# Patient Record
Sex: Female | Born: 1945
Health system: Southern US, Community
[De-identification: ages and names within clinical notes are randomized; demographics above are authoritative.]

## PROBLEM LIST (undated history)

## (undated) DIAGNOSIS — S52023A Displaced fracture of olecranon process without intraarticular extension of unspecified ulna, initial encounter for closed fracture: Principal | ICD-10-CM

## (undated) DIAGNOSIS — I499 Cardiac arrhythmia, unspecified: Secondary | ICD-10-CM

## (undated) DIAGNOSIS — R008 Other abnormalities of heart beat: Secondary | ICD-10-CM

## (undated) DIAGNOSIS — T4145XA Adverse effect of unspecified anesthetic, initial encounter: Secondary | ICD-10-CM

## (undated) DIAGNOSIS — S52031K Displaced fracture of olecranon process with intraarticular extension of right ulna, subsequent encounter for closed fracture with nonunion: Secondary | ICD-10-CM

## (undated) DIAGNOSIS — D649 Anemia, unspecified: Secondary | ICD-10-CM

## (undated) DIAGNOSIS — I10 Essential (primary) hypertension: Secondary | ICD-10-CM

## (undated) DIAGNOSIS — T8859XA Other complications of anesthesia, initial encounter: Secondary | ICD-10-CM

## (undated) DIAGNOSIS — E785 Hyperlipidemia, unspecified: Secondary | ICD-10-CM

## (undated) DIAGNOSIS — Z973 Presence of spectacles and contact lenses: Secondary | ICD-10-CM

## (undated) DIAGNOSIS — Z8619 Personal history of other infectious and parasitic diseases: Secondary | ICD-10-CM

## (undated) DIAGNOSIS — R112 Nausea with vomiting, unspecified: Secondary | ICD-10-CM

## (undated) DIAGNOSIS — E039 Hypothyroidism, unspecified: Secondary | ICD-10-CM

## (undated) DIAGNOSIS — S52021A Displaced fracture of olecranon process without intraarticular extension of right ulna, initial encounter for closed fracture: Secondary | ICD-10-CM

## (undated) DIAGNOSIS — K5790 Diverticulosis of intestine, part unspecified, without perforation or abscess without bleeding: Secondary | ICD-10-CM

## (undated) DIAGNOSIS — K635 Polyp of colon: Secondary | ICD-10-CM

## (undated) DIAGNOSIS — M1990 Unspecified osteoarthritis, unspecified site: Secondary | ICD-10-CM

## (undated) DIAGNOSIS — J45909 Unspecified asthma, uncomplicated: Secondary | ICD-10-CM

## (undated) DIAGNOSIS — Z972 Presence of dental prosthetic device (complete) (partial): Secondary | ICD-10-CM

## (undated) DIAGNOSIS — Z9889 Other specified postprocedural states: Secondary | ICD-10-CM

## (undated) DIAGNOSIS — K219 Gastro-esophageal reflux disease without esophagitis: Secondary | ICD-10-CM

## (undated) DIAGNOSIS — R7301 Impaired fasting glucose: Secondary | ICD-10-CM

## (undated) HISTORY — DX: Other abnormalities of heart beat: R00.8

## (undated) HISTORY — PX: CHOLECYSTECTOMY: SHX55

## (undated) HISTORY — PX: TONSILLECTOMY: SUR1361

## (undated) HISTORY — PX: OTHER SURGICAL HISTORY: SHX169

## (undated) HISTORY — DX: Anemia, unspecified: D64.9

## (undated) HISTORY — PX: TUBAL LIGATION: SHX77

## (undated) HISTORY — PX: HEMORROIDECTOMY: SUR656

## (undated) HISTORY — PX: NASAL SINUS SURGERY: SHX719

## (undated) HISTORY — DX: Hyperlipidemia, unspecified: E78.5

## (undated) HISTORY — PX: KNEE ARTHROSCOPY: SUR90

## (undated) HISTORY — DX: Diverticulosis of intestine, part unspecified, without perforation or abscess without bleeding: K57.90

## (undated) HISTORY — PX: BELPHAROPTOSIS REPAIR: SHX369

## (undated) HISTORY — DX: Gastro-esophageal reflux disease without esophagitis: K21.9

## (undated) HISTORY — PX: COLONOSCOPY: SHX174

## (undated) HISTORY — DX: Displaced fracture of olecranon process without intraarticular extension of unspecified ulna, initial encounter for closed fracture: S52.023A

## (undated) HISTORY — DX: Hypothyroidism, unspecified: E03.9

## (undated) HISTORY — PX: UPPER GI ENDOSCOPY: SHX6162

## (undated) HISTORY — DX: Impaired fasting glucose: R73.01

## (undated) HISTORY — DX: Polyp of colon: K63.5

## (undated) HISTORY — DX: Unspecified asthma, uncomplicated: J45.909

---

## 2011-06-29 ENCOUNTER — Encounter: Payer: Self-pay | Admitting: Internal Medicine

## 2011-06-29 ENCOUNTER — Ambulatory Visit (INDEPENDENT_AMBULATORY_CARE_PROVIDER_SITE_OTHER): Payer: BC Managed Care – PPO | Admitting: Internal Medicine

## 2011-06-29 DIAGNOSIS — Z8739 Personal history of other diseases of the musculoskeletal system and connective tissue: Secondary | ICD-10-CM

## 2011-06-29 DIAGNOSIS — E669 Obesity, unspecified: Secondary | ICD-10-CM

## 2011-06-29 DIAGNOSIS — R3915 Urgency of urination: Secondary | ICD-10-CM | POA: Insufficient documentation

## 2011-06-29 DIAGNOSIS — R7301 Impaired fasting glucose: Secondary | ICD-10-CM | POA: Insufficient documentation

## 2011-06-29 DIAGNOSIS — K219 Gastro-esophageal reflux disease without esophagitis: Secondary | ICD-10-CM

## 2011-06-29 DIAGNOSIS — H409 Unspecified glaucoma: Secondary | ICD-10-CM

## 2011-06-29 DIAGNOSIS — N6029 Fibroadenosis of unspecified breast: Secondary | ICD-10-CM

## 2011-06-29 DIAGNOSIS — E785 Hyperlipidemia, unspecified: Secondary | ICD-10-CM | POA: Insufficient documentation

## 2011-06-29 DIAGNOSIS — Z79890 Hormone replacement therapy (postmenopausal): Secondary | ICD-10-CM

## 2011-06-29 DIAGNOSIS — J45909 Unspecified asthma, uncomplicated: Secondary | ICD-10-CM | POA: Insufficient documentation

## 2011-06-29 DIAGNOSIS — E039 Hypothyroidism, unspecified: Secondary | ICD-10-CM

## 2011-06-29 NOTE — Progress Notes (Signed)
Subjective:    Patient ID: Marie Kelly, female    DOB: 04/22/1947, 66 y.o.   MRN: [id number]  HPI  Patient comes in as new patient visit . Previous care was  In [place]. Family and gyne  Physician. Has a few records .  Here since   June 2012 now establishing  With PCP. Ongoing conditions:  Asthma  Does not take inhaler every day  .  Advair. Has been on this however for a long time.  For 15 years.   Couple times a week.  Hx of pulm eval neg asthma but then had flare.  2008.   Hx of lipid medicine.  lipitor   For elevated lipids   But changed to Pravachol for cost reasons.    hrt  For heart disease foferever  And forother.  Hx of 3 dand cs . And then low dose. Review of Systems Diverticulosis no flare  Just dx glaucoma  Off an on 2009 .   Hx of hemorhage night vision HB reflux taking prn nexium.  H pylori  Egd.  About 10 year ago. Thyroid : dx  Years ago and underactive . On generic .    ROS:  GEN/ HEENT: No fever, gained weight after move  sweats headaches vision problems hearing changes, CV/ PULM; No chest pain shortness of breath cough, syncope,edema  change in exercise tolerance. GI /GU: No adominal pain, vomiting, change in bowel habits. No blood in the stool. No significant GU symptoms. SKIN/HEME: ,no acute skin rashes suspicious lesions or bleeding. No lymphadenopathy, nodules, masses.  NEURO/ PSYCH:  No neurologic signs such as weakness numbness. No depression anxiety. IMM/ Allergy: No unusual infections.  Allergy .  Asthma as per hpi  REST of 12 system review negative except as per HPI  Past Medical History  Diagnosis Date  . Colon polyps   . GERD (gastroesophageal reflux disease)     had endo? taking nexium 3 x per week  . Diverticulosis   . Hyperlipidemia     had been on lipitor cahnges to pravachol cause of cost and tricor has since stopped  NO  se reported   . Anemia   . Impaired fasting glucose   . Hypothyroidism      on meds for years    History   Social  History  . Marital Status: Unknown    Spouse Name: N/A    Number of Children: N/A  . Years of Education: N/A   Occupational History  . Not on file.   Social History Main Topics  . Smoking status: Former Games developer  . Smokeless tobacco: Not on file  . Alcohol Use: Yes     socially  . Drug Use: No  . Sexually Active: Not on file   Other Topics Concern  . 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 Gravida 3 para 3Last td 6 2008REmote hx of tobacco 1.5 ppd for 6 years as a teen Q 65    Past Surgical History  Procedure Date  . Tubal ligation   . Cholecystectomy   . Uterus repair   . Hemorroidectomy     Family History  Problem Relation Age of Onset  . Stroke Mother   . Diabetes Father     Allergies  Allergen Reactions  .  Hydrochlorothiazide     Muscles tightness and felt like she was hit by a truck    No current outpatient prescriptions on file prior to visit.    BP 120/80  Pulse 60  Ht 5\' 4"  (1.626 m)  Wt 200 lb (90.719 kg)  BMI 34.33 kg/m2        Objective:   Physical Exam Physical Exam: Vital signs reviewed FAO:ZHYQ is a well-developed well-nourished alert cooperative  white female who appears her stated age in no acute distress.  HEENT: normocephalic atraumatic , Eyes: PERRL EOM's full, conjunctiva clear, Nares: paten,t no deformity discharge or tenderness., Ears: no deformity EAC's clear TMs with normal landmarks. Mouth: clear OP, no lesions, edema.  Moist mucous membranes. Dentition in adequate repair. NECK: supple without masses, thyromegaly or bruits. CHEST/PULM:  Clear to auscultation and percussion breath sounds equal no wheeze , rales or rhonchi. No chest wall deformities or tenderness. CV: PMI is nondisplaced, S1 S2 no gallops, murmurs, rubs. Peripheral pulses are full without delay.No JVD .    ABDOMEN: Bowel sounds normal nontender  No guard or rebound, no hepato splenomegal no CVA tenderness.  No hernia. Well healed scar ruq  Extremtities:  No clubbing cyanosis or edema, no acute joint swelling or redness no focal atrophy NEURO:  Oriented x3, cranial nerves 3-12 appear to be intact, no obvious focal weakness,gait within normal limits  SKIN: No acute rashes normal turgor, color, no bruising or petechiae. PSYCH: Oriented, good eye contact, no obvious depression anxiety, cognition and judgment appear normal. LN: no cervical  adenopathy    Records reviewed with and after patient left areas a cholesterol of 202 HDL 54 triglycerides 85 LDL 131 with a ratio of 3.7 hemoglobin A1c 5.6 this was 03/05/2011 unclear what medication she was on at that time.       Assessment & Plan:   Hyperlipidemia Previously on medication stopped because she heard  it wasn't helpful in women. She apparently was on pravastatin and TriCor. She's never had heart disease or diabetes although has had some hyperglycemia. She apparently had no side effects of medication and did get liver checked every 6 months.  Counseled about risk benefit based on risk which would include age. She has no other risk factors that I can tell at this time. She does ask about getting particle size for her cholesterol panel however discussed with her the practice guidelines would not indicate this would be helpful for her outcome. Discussed the [place] trial and other situations and the use of coronary artery calcium score is.  At this point in time we will implement lifestyle intervention healthy lifestyle and check her lipids in one to 2 months  and then plan other interventions.  Asthma  apparently controlled on daily Advair however most recently is only taking it a few times a week. At some point she may want to down shift to straight inhaled cortisone as opposed to the combination inhaler.  Hormone replacement therapy: According to  patient she was placed on the sac menopause although she had no symptoms and was told that it was healthy for her heart and to avoid vaginal dryness and other problems and she's been on it since that time. She does have lumpy breasts and may benefit  from weaning off of this situation. We discussed risk benefit of hormone replacement at this time it does not appear she has more benefit than risk. Discussed weaning the medication and see her she doesn't follow visit.  Hypothyroid  on replacement for a number of years we'll continue at this time and recheck Early glaucoma on drops    History of hyperglycemia family history of diabetes  discussed importance of lifestyle intervention weight loss and monitoring.  Obesity counseled as above  GE reflux disease  apparently no alarm complications and she is taking Nexium about 3 times a  week discussed metabolic affects of chronic use and weight loss and lifestyle would be best to minimize need for medication.  Urinary urgency has been on Detrol for number of years can continue no apparent side effects  Review of records from the gynecologist; apparently was on Fosamax for 4 or 5 years and then Actonel for another 3-4 years. DEXA in 2000 showed a normal scan except for mild osteopenia and L3. Her last DEXA scan was 2011 which showed 0.6 T score in the spine and -0.9 in the hip. At this time it appears she was off bisphosphonates for couple years.   There is no history of fracture  but the scan report says there was a family history of osteoporosis.     Total visit 45 mins > 50% spent counseling and coordinating care  Review of data

## 2011-06-29 NOTE — Patient Instructions (Addendum)
Wean the  Hormone   Go to 4 x per week and then every other day.     And finally off.   If getting hot flushes then stop weaning.   Agree with using nexium as needed and do life style changes to help heartbun.   Plan fasting labs and then follow up.   Weight loss .  Will help all of the parameters of blood sugar reflux and lipids.

## 2011-06-30 DIAGNOSIS — Z8739 Personal history of other diseases of the musculoskeletal system and connective tissue: Secondary | ICD-10-CM | POA: Insufficient documentation

## 2011-06-30 NOTE — Assessment & Plan Note (Signed)
Review of records from the gynecologist; apparently was on Fosamax for 4 or 5 years and then Actonel for another 3-4 years. DEXA in 2000 showed a normal scan except for mild osteopenia and L3. Her last DEXA scan was 2011 which showed 0.6 T score in the spine and -0.9 in the hip. At this time it appears she was off bisphosphonates for couple years.   There is no history of fracture  but the scan report says there was a family history of osteoporosis.

## 2011-07-02 ENCOUNTER — Other Ambulatory Visit: Payer: Self-pay | Admitting: Internal Medicine

## 2011-07-02 DIAGNOSIS — Z1231 Encounter for screening mammogram for malignant neoplasm of breast: Secondary | ICD-10-CM

## 2011-07-04 ENCOUNTER — Encounter: Payer: Self-pay | Admitting: Internal Medicine

## 2011-07-04 DIAGNOSIS — H409 Unspecified glaucoma: Secondary | ICD-10-CM | POA: Insufficient documentation

## 2011-07-04 DIAGNOSIS — E669 Obesity, unspecified: Secondary | ICD-10-CM | POA: Insufficient documentation

## 2011-07-29 ENCOUNTER — Ambulatory Visit
Admission: RE | Admit: 2011-07-29 | Discharge: 2011-07-29 | Disposition: A | Discharge status: Home / Self Care | Payer: BC Managed Care – PPO | Source: Ambulatory Visit | Attending: Internal Medicine | Admitting: Internal Medicine

## 2011-07-29 DIAGNOSIS — Z1231 Encounter for screening mammogram for malignant neoplasm of breast: Secondary | ICD-10-CM

## 2011-08-25 ENCOUNTER — Ambulatory Visit (INDEPENDENT_AMBULATORY_CARE_PROVIDER_SITE_OTHER): Payer: BC Managed Care – PPO | Admitting: Internal Medicine

## 2011-08-25 ENCOUNTER — Encounter: Payer: Self-pay | Admitting: Internal Medicine

## 2011-08-25 ENCOUNTER — Other Ambulatory Visit: Payer: Self-pay | Admitting: Internal Medicine

## 2011-08-25 VITALS — BP 132/78 | HR 71 | Temp 98.7°F | Wt 199.0 lb

## 2011-08-25 DIAGNOSIS — L02419 Cutaneous abscess of limb, unspecified: Secondary | ICD-10-CM

## 2011-08-25 DIAGNOSIS — IMO0002 Reserved for concepts with insufficient information to code with codable children: Secondary | ICD-10-CM | POA: Insufficient documentation

## 2011-08-25 MED ORDER — DOXYCYCLINE HYCLATE 100 MG PO CAPS: 100.0000 mg | ORAL_CAPSULE | Freq: Two times a day (BID) | ORAL | Status: DC

## 2011-08-25 NOTE — Progress Notes (Signed)
  Subjective:    Patient ID: Marie Kelly, female    DOB: 07/12/1945, 66 y.o.   MRN: [id number]  HPI Comes in today for an acute visit. She had the onset 6 days ago of a red boil on her right upper thigh that is increased in size. Today it is slightly less red no drainage no fever no treatment except for warm compresses. She has no history of recurrent boils no exposure to staph infection except for a friend of her sons. No direct contact.   Review of Systems No fever constitutonal sx NVD new rash  Of adenopathy    Past history family history social history reviewed in the electronic medical record. Outpatient Prescriptions Prior to Visit  Medication Sig Dispense Refill  . fluticasone (FLONASE) 50 MCG/ACT nasal spray Place 2 sprays into the nose daily.      . Fluticasone-Salmeterol (ADVAIR) 250-50 MCG/DOSE AEPB Inhale 1 puff into the lungs every 12 (twelve) hours.      Marland Kitchen latanoprost (XALATAN) 0.005 % ophthalmic solution 1 drop at bedtime.      Marland Kitchen levothyroxine (SYNTHROID, LEVOTHROID) 50 MCG tablet Take 50 mcg by mouth daily.      . montelukast (SINGULAIR) 10 MG tablet Take 10 mg by mouth at bedtime.      . norethindrone-ethinyl estradiol (FEMHRT LOW DOSE) 0.5-2.5 MG-MCG per tablet Take 1 tablet by mouth daily.      Marland Kitchen tolterodine (DETROL LA) 2 MG 24 hr capsule Take 2 mg by mouth daily.           Objective:   Physical Exam BP 132/78  Pulse 71  Temp(Src) 98.7 F (37.1 C) (Oral)  Wt 199 lb (90.266 kg)  SpO2 98% WDWN in nad Abdomen:  Sof,t normal bowel sounds without hepatosplenomegaly, no guarding rebound or masses no CVA tenderness Skin right upper thigh shows a 3 cm cystic abscess-like raised area with 5-6 cm of erythema around it. There is no vesicle no adenopathy.       Assessment & Plan:  Abscess boil possibly infected cyst  I&D explained   : pt agrees   1% lidocaine 2 cc opened up with 20-gauge needle and a  #11 blade explored with pickups.  Moderate amount of yellow  discharge noted and expressed. Patient tolerated this well discharge sent for culture. Dry dressing   Plan warm compresses antibiotic doxycycline expectant management followup; normal skin hygiene to prevent transmission.

## 2011-08-25 NOTE — Patient Instructions (Signed)
  Warm compresses  To continue antibiotic .  Call if not improving or recurring .  Abscess An abscess (boil or furuncle) is an infected area that contains a collection of pus.  SYMPTOMS Signs and symptoms of an abscess include pain, tenderness, redness, or hardness. You may feel a moveable soft area under your skin. An abscess can occur anywhere in the body.  TREATMENT  A surgical cut (incision) may be made over your abscess to drain the pus. Gauze may be packed into the space or a drain may be looped through the abscess cavity (pocket). This provides a drain that will allow the cavity to heal from the inside outwards. The abscess may be painful for a few days, but should feel much better if it was drained.  Your abscess, if seen early, may not have localized and may not have been drained. If not, another appointment may be required if it does not get better on its own or with medications. HOME CARE INSTRUCTIONS   Only take over-the-counter or prescription medicines for pain, discomfort, or fever as directed by your caregiver.   Take your antibiotics as directed if they were prescribed. Finish them even if you start to feel better.   Keep the skin and clothes clean around your abscess.   If the abscess was drained, you will need to use gauze dressing to collect any draining pus. Dressings will typically need to be changed 3 or more times a day.   The infection may spread by skin contact with others. Avoid skin contact as much as possible.   Practice good hygiene. This includes regular hand washing, cover any draining skin lesions, and do not share personal care items.   If you participate in sports, do not share athletic equipment, towels, whirlpools, or personal care items. Shower after every practice or tournament.   If a draining area cannot be adequately covered:   Do not participate in sports.   Children should not participate in day care until the wound has healed or drainage  stops.   If your caregiver has given you a follow-up appointment, it is very important to keep that appointment. Not keeping the appointment could result in a much worse infection, chronic or permanent injury, pain, and disability. If there is any problem keeping the appointment, you must call back to this facility for assistance.  SEEK MEDICAL CARE IF:   You develop increased pain, swelling, redness, drainage, or bleeding in the wound site.   You develop signs of generalized infection including muscle aches, chills, fever, or a general ill feeling.   You have an oral temperature above 102 F (38.9 C).  MAKE SURE YOU:   Understand these instructions.   Will watch your condition.   Will get help right away if you are not doing well or get worse.  Document Released: 02/10/2005 Document Revised: 04/22/2011 Document Reviewed: 12/05/2007 [facility] Patient Information 2012 [place], [place].

## 2011-08-27 ENCOUNTER — Other Ambulatory Visit (INDEPENDENT_AMBULATORY_CARE_PROVIDER_SITE_OTHER): Payer: BC Managed Care – PPO

## 2011-08-27 DIAGNOSIS — R7301 Impaired fasting glucose: Secondary | ICD-10-CM

## 2011-08-27 DIAGNOSIS — E785 Hyperlipidemia, unspecified: Secondary | ICD-10-CM

## 2011-08-27 DIAGNOSIS — E039 Hypothyroidism, unspecified: Secondary | ICD-10-CM

## 2011-08-27 LAB — HEPATIC FUNCTION PANEL
AST: 23 U/L (ref 0–37)
Albumin: 4.5 g/dL (ref 3.5–5.2)
Alkaline Phosphatase: 36 U/L — ABNORMAL LOW (ref 39–117)
Bilirubin, Direct: 0 mg/dL (ref 0.0–0.3)
Total Protein: 7.4 g/dL (ref 6.0–8.3)

## 2011-08-27 LAB — CBC WITH DIFFERENTIAL/PLATELET
Basophils Absolute: 0 10*3/uL (ref 0.0–0.1)
Basophils Relative: 0.4 % (ref 0.0–3.0)
HCT: 39.5 % (ref 36.0–46.0)
Hemoglobin: 13.3 g/dL (ref 12.0–15.0)
Lymphocytes Relative: 24.8 % (ref 12.0–46.0)
Lymphs Abs: 1.2 10*3/uL (ref 0.7–4.0)
MCHC: 33.6 g/dL (ref 30.0–36.0)
Monocytes Relative: 9.1 % (ref 3.0–12.0)
Neutro Abs: 2.9 10*3/uL (ref 1.4–7.7)
RBC: 4.65 Mil/uL (ref 3.87–5.11)
RDW: 13.7 % (ref 11.5–14.6)

## 2011-08-27 LAB — LIPID PANEL
Cholesterol: 175 mg/dL (ref 0–200)
Triglycerides: 67 mg/dL (ref 0.0–149.0)

## 2011-08-27 LAB — BASIC METABOLIC PANEL
BUN: 13 mg/dL (ref 6–23)
CO2: 24 mEq/L (ref 19–32)
Calcium: 9.3 mg/dL (ref 8.4–10.5)
Chloride: 107 mEq/L (ref 96–112)
Creatinine, Ser: 0.7 mg/dL (ref 0.4–1.2)

## 2011-08-28 LAB — WOUND CULTURE: Gram Stain: NONE SEEN

## 2011-09-03 ENCOUNTER — Encounter: Payer: Self-pay | Admitting: Internal Medicine

## 2011-09-03 ENCOUNTER — Ambulatory Visit (INDEPENDENT_AMBULATORY_CARE_PROVIDER_SITE_OTHER): Payer: BC Managed Care – PPO | Admitting: Internal Medicine

## 2011-09-03 VITALS — BP 168/98 | HR 76 | Temp 98.8°F | Wt 196.0 lb

## 2011-09-03 DIAGNOSIS — Z79890 Hormone replacement therapy (postmenopausal): Secondary | ICD-10-CM

## 2011-09-03 DIAGNOSIS — L03119 Cellulitis of unspecified part of limb: Secondary | ICD-10-CM

## 2011-09-03 DIAGNOSIS — E039 Hypothyroidism, unspecified: Secondary | ICD-10-CM

## 2011-09-03 DIAGNOSIS — L02419 Cutaneous abscess of limb, unspecified: Secondary | ICD-10-CM

## 2011-09-03 DIAGNOSIS — IMO0002 Reserved for concepts with insufficient information to code with codable children: Secondary | ICD-10-CM

## 2011-09-03 DIAGNOSIS — E785 Hyperlipidemia, unspecified: Secondary | ICD-10-CM

## 2011-09-03 DIAGNOSIS — R7301 Impaired fasting glucose: Secondary | ICD-10-CM

## 2011-09-03 DIAGNOSIS — R030 Elevated blood-pressure reading, without diagnosis of hypertension: Secondary | ICD-10-CM

## 2011-09-03 MED ORDER — DOXYCYCLINE HYCLATE 100 MG PO CAPS: 100.0000 mg | ORAL_CAPSULE | Freq: Two times a day (BID) | ORAL | Status: DC

## 2011-09-03 NOTE — Progress Notes (Signed)
  Subjective:    Patient ID: Marie Kelly, female    DOB: 1945-09-30, 66 y.o.   MRN: [id number]  HPI Patient comes in for followup of multiple medical problems. Since last visit abscess cyst is better still hurts  darined for a while and not none. Antibiotic causes some gi issues. No fever new areas. Here for fu labs states  See past note Review of Systems No fever chills  syndop NVD .  Some heart burn with  Doxy no rash . bp seems up per her cause of a stress issue  GD with a tick bite? Never had HT per se.  Past history family history social history reviewed in the electronic medical record.     Objective:   Physical Exam  BP 168/98  Pulse 76  Temp(Src) 98.8 F (37.1 C) (Oral)  Wt 196 lb (88.905 kg)  SpO2 96% Repeat bp 160/90 right sitting  wddwn in nad mildy anxious. Chest: nl respirations  CV:  S1-S2 no gallops or murmurs peripheral perfusion is normal   Lab Results  Component Value Date   WBC 4.8 08/27/2011   HGB 13.3 08/27/2011   HCT 39.5 08/27/2011   PLT 259.0 08/27/2011   GLUCOSE 81 08/27/2011   CHOL 175 08/27/2011   TRIG 67.0 08/27/2011   HDL 49.10 08/27/2011   LDLCALC 113* 08/27/2011   ALT 32 08/27/2011   AST 23 08/27/2011   NA 140 08/27/2011   K 4.9 08/27/2011   CL 107 08/27/2011   CREATININE 0.7 08/27/2011   BUN 13 08/27/2011   CO2 24 08/27/2011   TSH 3.32 08/27/2011   HGBA1C 5.8 08/27/2011   Lab reviewed with patient  See gm st and cx      Assessment & Plan:    Abscess looks improved but she states it's still tender. Continue hot compresses and a few more days of antibiotics   Elevated blood pressure readings this is a new she has some stressful information this morning usually is normal. She will check a followup consistently elevated. Reviewed bp monitoring and to fu if still elevated   Hyperglycemia   Hyperlipidemia :appears to be acceptable levels off of medication.  Hypothyroid :continue same medication she is on generic at this time.  HRT  weaning  If everything is improved wellness check in 6 months.  Total visit > 50% spent counseling and coordinating care

## 2011-09-03 NOTE — Patient Instructions (Signed)
Your laboratory studies are good stay on the same medications at this point.  Continue warm compresses to the abscess area can take 4-5 more days of antibiotic if needed. Expect continued improvement into next week. He can take the antibiotic with food.  Checked her blood pressure readings at least 3 times this week if not controlled plan followup visit to discuss.  Blood pressure goal is below 140/90.  Otherwise preventive visit in 6 months.

## 2011-09-04 ENCOUNTER — Encounter: Payer: Self-pay | Admitting: Internal Medicine

## 2011-09-04 DIAGNOSIS — R030 Elevated blood-pressure reading, without diagnosis of hypertension: Secondary | ICD-10-CM | POA: Insufficient documentation

## 2011-09-04 NOTE — Assessment & Plan Note (Signed)
Better still on antibiotic  Still tender    Will can add 5 more days of antibiotic    Expectant management. Fu if  persistent or progressive  cx did not grow  Pathogen but had gm pos on gm stain

## 2011-09-04 NOTE — Assessment & Plan Note (Signed)
No high risk condition except age and levels acceptable off meds  Will follow at this time off meds.  Continue lifestyle intervention healthy eating and exercise .

## 2011-09-04 NOTE — Assessment & Plan Note (Signed)
Acceptable levels no change

## 2011-09-04 NOTE — Assessment & Plan Note (Signed)
Lab Results  Component Value Date   HGBA1C 5.8 08/27/2011  Continue lifestyle intervention healthy eating and exercise .

## 2011-09-09 ENCOUNTER — Ambulatory Visit (INDEPENDENT_AMBULATORY_CARE_PROVIDER_SITE_OTHER): Payer: BC Managed Care – PPO | Admitting: Internal Medicine

## 2011-09-09 ENCOUNTER — Ambulatory Visit (INDEPENDENT_AMBULATORY_CARE_PROVIDER_SITE_OTHER): Payer: BC Managed Care – PPO | Admitting: General Surgery

## 2011-09-09 ENCOUNTER — Encounter: Payer: Self-pay | Admitting: Internal Medicine

## 2011-09-09 ENCOUNTER — Encounter (INDEPENDENT_AMBULATORY_CARE_PROVIDER_SITE_OTHER): Payer: Self-pay | Admitting: General Surgery

## 2011-09-09 VITALS — BP 127/84 | HR 74 | Temp 98.7°F | Resp 16 | Ht 64.5 in | Wt 198.2 lb

## 2011-09-09 VITALS — BP 146/90 | HR 60 | Temp 98.6°F | Wt 199.0 lb

## 2011-09-09 DIAGNOSIS — L03119 Cellulitis of unspecified part of limb: Secondary | ICD-10-CM

## 2011-09-09 DIAGNOSIS — Z23 Encounter for immunization: Secondary | ICD-10-CM

## 2011-09-09 DIAGNOSIS — IMO0002 Reserved for concepts with insufficient information to code with codable children: Secondary | ICD-10-CM

## 2011-09-09 DIAGNOSIS — L02419 Cutaneous abscess of limb, unspecified: Secondary | ICD-10-CM | POA: Insufficient documentation

## 2011-09-09 MED ORDER — AMOXICILLIN-POT CLAVULANATE 875-125 MG PO TABS: 1.0000 | ORAL_TABLET | Freq: Two times a day (BID) | ORAL | Status: AC

## 2011-09-09 NOTE — Progress Notes (Signed)
  Subjective:    Patient ID: Marie Kelly, female    DOB: 17-Dec-1945, 66 y.o.   MRN: [id number]  HPI Patient comes in today for SDA for worsening problem evaluation. Periodic her thigh is still not better and is still tender to touch. It is no longer draining and she is still on the antibiotic. It isn't worse and she has no fever.  Also asks about updating the Tdap Because she's going to have a new grandchild in June her last one was about 5 years ago  Review of Systems No fever chills new skin lesions    Objective:   Physical Exam BP 146/90  Pulse 60  Temp(Src) 98.6 F (37 C) (Oral)  Wt 199 lb (90.266 kg)  SpO2 98% Well-developed well-nourished in no acute distress examination of right upper thigh 2 cm cystic feeling firm with found her on expression some cystic material and pus expressed Some redness surrounding it but no streaking or cellulitis. Culture taken again     Assessment & Plan:   Failure to resolve right leg possible cyst abscess  improved from initial presentation   Advise surgery consult change antibiotic to Augmentin  tdap given today  In expectation of grand child in June

## 2011-09-09 NOTE — Progress Notes (Signed)
Subjective:     Patient ID: Marie Kelly, female   DOB: 1946-04-20, 66 y.o.   MRN: [id number]  HPI Patient has been treated by Dr.Panosh for a right thigh abscess for 2 weeks. She was initially on doxycycline. Limited incision and drainage was done in Dr. Rosezella [place] office and cultures were sent. It has not improved completely. Her antibiotics were changed to Augmentin.  She presents for urgent clinic today  Review of Systems     Objective:   Physical Exam Right anterior thigh has a 2 cm abscess in the proximal region. There is underlying induration. There is a central opening from incision and drainage that has clear drainage. Procedure note: The area was prepped in sterile fashion. Was anesthetized with local anesthetic. Incision was made transversely across the indurated area. I entered a pocket with some serosanguineous fluid collection. The area was thoroughly cleaned out. Iodoform packing was placed followed by gauze dressing and she tolerated this well    Assessment:     Right thigh abscess    Plan:     This was incised and drained as described above. Wound care instructions were given. She will continue her antibiotics. She will remove the packing in 2 days. I will see her back in followup.

## 2011-09-09 NOTE — Patient Instructions (Signed)
T.dap today  Change antibiotic to Augmentin for a week We will refer you to surgery this appears to be some kind of cyst abscess and would have them treat as appropriate.

## 2011-09-09 NOTE — Patient Instructions (Signed)
Remove packing on Saturday morning. Cover area with gauze daily and as needed.

## 2011-09-12 LAB — WOUND CULTURE: Gram Stain: NONE SEEN

## 2011-09-16 NOTE — Progress Notes (Signed)
Quick Note:  Pt aware and states she has gone to [facility]. Pt is still a little sore. Pt has had it lanced and packed. ______

## 2011-09-29 ENCOUNTER — Encounter (INDEPENDENT_AMBULATORY_CARE_PROVIDER_SITE_OTHER): Payer: Self-pay | Admitting: General Surgery

## 2011-09-29 ENCOUNTER — Ambulatory Visit (INDEPENDENT_AMBULATORY_CARE_PROVIDER_SITE_OTHER): Payer: BC Managed Care – PPO | Admitting: General Surgery

## 2011-09-29 VITALS — BP 180/78 | HR 74 | Temp 96.8°F | Resp 18 | Ht 64.5 in | Wt 199.8 lb

## 2011-09-29 DIAGNOSIS — L02419 Cutaneous abscess of limb, unspecified: Secondary | ICD-10-CM

## 2011-09-29 DIAGNOSIS — L03119 Cellulitis of unspecified part of limb: Secondary | ICD-10-CM

## 2011-09-29 NOTE — Progress Notes (Signed)
Subjective:     Patient ID: Marie Kelly, female   DOB: October 25, 1945, 66 y.o.   MRN: [id number]  HPI Patient presents for followup status post I&D of abscess right thigh. She's been doing well. She says the wound is nearly healed.She has completed her course of antibiotics.  Review of Systems     Objective:   Physical Exam Cellulitis has resolved, wound is nearly healed with less than a centimeter of opening. Antibiotic ointment and a bandage were applied. There is no evidence of ongoing infection.    Assessment:     Doing well status post incision and drainage of right thigh abscess    Plan:     Continue local wound care and return p.r.n.

## 2011-12-07 ENCOUNTER — Other Ambulatory Visit: Payer: Self-pay | Admitting: Family Medicine

## 2011-12-07 MED ORDER — MONTELUKAST SODIUM 10 MG PO TABS: 10.0000 mg | ORAL_TABLET | Freq: Every day | ORAL | Status: DC

## 2011-12-07 MED ORDER — LEVOTHYROXINE SODIUM 50 MCG PO TABS: 50.0000 ug | ORAL_TABLET | Freq: Every day | ORAL | Status: DC

## 2012-01-10 DIAGNOSIS — H401290 Low-tension glaucoma, unspecified eye, stage unspecified: Secondary | ICD-10-CM | POA: Diagnosis not present

## 2012-01-10 DIAGNOSIS — H409 Unspecified glaucoma: Secondary | ICD-10-CM | POA: Diagnosis not present

## 2012-03-07 ENCOUNTER — Encounter: Payer: Self-pay | Admitting: Internal Medicine

## 2012-03-07 ENCOUNTER — Ambulatory Visit (INDEPENDENT_AMBULATORY_CARE_PROVIDER_SITE_OTHER): Payer: BC Managed Care – PPO | Admitting: Internal Medicine

## 2012-03-07 VITALS — BP 154/86 | HR 69 | Temp 98.5°F | Wt 204.0 lb

## 2012-03-07 DIAGNOSIS — R7301 Impaired fasting glucose: Secondary | ICD-10-CM

## 2012-03-07 DIAGNOSIS — Z8739 Personal history of other diseases of the musculoskeletal system and connective tissue: Secondary | ICD-10-CM | POA: Diagnosis not present

## 2012-03-07 DIAGNOSIS — E785 Hyperlipidemia, unspecified: Secondary | ICD-10-CM

## 2012-03-07 DIAGNOSIS — E039 Hypothyroidism, unspecified: Secondary | ICD-10-CM | POA: Diagnosis not present

## 2012-03-07 DIAGNOSIS — R030 Elevated blood-pressure reading, without diagnosis of hypertension: Secondary | ICD-10-CM | POA: Diagnosis not present

## 2012-03-07 DIAGNOSIS — M810 Age-related osteoporosis without current pathological fracture: Secondary | ICD-10-CM

## 2012-03-07 LAB — LIPID PANEL: Total CHOL/HDL Ratio: 5

## 2012-03-07 NOTE — Progress Notes (Signed)
  Subjective:    Patient ID: Marie Kelly, female    DOB: Feb 23, 1946, 66 y.o.   MRN: [id number]  HPI Patient comes in today for follow up of  multiple medical problems.  Allergy asthma   Stable using advair prn.  Thyroid  Taking every day on generic  Synthroid. LIPIDS  LSI  Abscess of thogh finally better has area left no recurrence   Urinary  meds ? Help  Review of Systems No fever cp sob new problems bleeding  Past history family history social history reviewed in the electronic medical record. Outpatient Encounter Prescriptions as of 03/07/2012  Medication Sig Dispense Refill  . fluticasone (FLONASE) 50 MCG/ACT nasal spray Place 2 sprays into the nose daily.      . Fluticasone-Salmeterol (ADVAIR) 250-50 MCG/DOSE AEPB Inhale 1 puff into the lungs every 12 (twelve) hours.      Marland Kitchen latanoprost (XALATAN) 0.005 % ophthalmic solution 1 drop at bedtime.      Marland Kitchen levothyroxine (SYNTHROID, LEVOTHROID) 50 MCG tablet Take 1 tablet (50 mcg total) by mouth daily.  30 tablet  6  . montelukast (SINGULAIR) 10 MG tablet Take 1 tablet (10 mg total) by mouth at bedtime.  30 tablet  6  . tolterodine (DETROL LA) 2 MG 24 hr capsule Take 2 mg by mouth daily.           Objective:   Physical Exam BP 154/86  Pulse 69  Temp 98.5 F (36.9 C) (Oral)  Wt 204 lb (92.534 kg)  SpO2 98% Repeat ed sitting WDWN in nad HEENT grossly nnl  Neck: Supple without adenopathy or masses or bruits Chest:  Clear to A&P without wheezes rales or rhonchi CV:  S1-S2 no gallops or murmurs peripheral perfusion is normal Abdomen:  Sof,t normal bowel sounds without hepatosplenomegaly, no guarding rebound or masses no CVA tenderness Thigh  Small purplish area remains but no mass or cystic area elt.   Lab Results  Component Value Date   WBC 4.8 08/27/2011   HGB 13.3 08/27/2011   HCT 39.5 08/27/2011   PLT 259.0 08/27/2011   GLUCOSE 81 08/27/2011   CHOL 175 08/27/2011   TRIG 67.0 08/27/2011   HDL 49.10 08/27/2011   LDLCALC 113*  08/27/2011   ALT 32 08/27/2011   AST 23 08/27/2011   NA 140 08/27/2011   K 4.9 08/27/2011   CL 107 08/27/2011   CREATININE 0.7 08/27/2011   BUN 13 08/27/2011   CO2 24 08/27/2011   TSH 3.32 08/27/2011   HGBA1C 5.8 08/27/2011      Assessment & Plan:   Elevated Bp readings  Newer finding?Needs fu  rx if HT  But need more data. Asthma allergy continue  meds    Expectant management. Thyroid continue on meds recheck today LIPIDS  Could be better  But lsi at this time  Hx of meds in past recheck  SP abcess thigh better no other rx needed. UI OAB on meds Bone health   Osteopenia ? Vs other   Get vit d today   Hx of bisphosphonates in past?

## 2012-03-07 NOTE — Patient Instructions (Addendum)
Check your bp readings  3 x per week if continued elevated we may need to add medication  Such as acei and then plan fu  Dec weight and sodium lsi .   Will notify you  of labs when available. Can acess by my chart.  If labs and bp ok then wellness visit in 6-8 months.

## 2012-03-08 DIAGNOSIS — Z23 Encounter for immunization: Secondary | ICD-10-CM | POA: Diagnosis not present

## 2012-03-08 LAB — VITAMIN D 25 HYDROXY (VIT D DEFICIENCY, FRACTURES): Vit D, 25-Hydroxy: 52 ng/mL (ref 30–89)

## 2012-03-08 LAB — LDL CHOLESTEROL, DIRECT: Direct LDL: 186.5 mg/dL

## 2012-03-09 LAB — TSH: TSH: 3.65 u[IU]/mL (ref 0.35–5.50)

## 2012-03-11 ENCOUNTER — Encounter: Payer: Self-pay | Admitting: Internal Medicine

## 2012-03-11 DIAGNOSIS — M810 Age-related osteoporosis without current pathological fracture: Secondary | ICD-10-CM | POA: Insufficient documentation

## 2012-06-05 ENCOUNTER — Other Ambulatory Visit: Payer: Self-pay | Admitting: Family Medicine

## 2012-06-05 MED ORDER — LEVOTHYROXINE SODIUM 50 MCG PO TABS: 50.0000 ug | ORAL_TABLET | Freq: Every day | ORAL | Status: DC

## 2012-06-05 MED ORDER — MONTELUKAST SODIUM 10 MG PO TABS: 10.0000 mg | ORAL_TABLET | Freq: Every day | ORAL | Status: DC

## 2012-06-08 DIAGNOSIS — H409 Unspecified glaucoma: Secondary | ICD-10-CM | POA: Diagnosis not present

## 2012-06-08 DIAGNOSIS — H401290 Low-tension glaucoma, unspecified eye, stage unspecified: Secondary | ICD-10-CM | POA: Diagnosis not present

## 2012-06-21 DIAGNOSIS — D485 Neoplasm of uncertain behavior of skin: Secondary | ICD-10-CM | POA: Diagnosis not present

## 2012-06-21 DIAGNOSIS — D235 Other benign neoplasm of skin of trunk: Secondary | ICD-10-CM | POA: Diagnosis not present

## 2012-08-21 ENCOUNTER — Telehealth: Payer: Self-pay | Admitting: Internal Medicine

## 2012-08-21 NOTE — Telephone Encounter (Signed)
Please give pt the information   Of the orthopedist office phone  Etc    uncertain if need an offical [facility] referral if plain medicare .  If  Actually needs needs a referral send to [place].please

## 2012-08-21 NOTE — Telephone Encounter (Signed)
Does not require referral.  Left message on patient's home number for her to return my call.

## 2012-08-21 NOTE — Telephone Encounter (Signed)
Pt broke her shoulder on 4-4  and needs ortho appt on Monday 08-28-12. Pt is still vacationing in [place]. Pt has xrays. Pt has medicare primary

## 2012-08-22 NOTE — Telephone Encounter (Signed)
Left a message on home phone for the pt to return my call. 

## 2012-08-23 ENCOUNTER — Encounter: Payer: Self-pay | Admitting: Family Medicine

## 2012-08-23 NOTE — Telephone Encounter (Signed)
Will now send a letter.  Tried calling 3 times.

## 2012-08-23 NOTE — Telephone Encounter (Signed)
Left message on home phone for the pt to return my call. 

## 2012-08-28 ENCOUNTER — Telehealth: Payer: Self-pay | Admitting: Internal Medicine

## 2012-08-28 NOTE — Telephone Encounter (Signed)
Do you have the names and numbers of these doctors to help this patient?

## 2012-08-28 NOTE — Telephone Encounter (Signed)
Patient received a letter in regards to referral. Patient would like a list of recommended orthopedic doctors in the area because she is not familiar with what is available.

## 2012-08-28 NOTE — Telephone Encounter (Signed)
[facility] [address], [address] Phone: [phone] [email or web address]  [facility]  [address] Phone: [phone] [email or web address]  [facility] [address] [place]. Phone: [phone] [email or web address]  [facility] [address] Phone: [phone] [email or web address]

## 2012-08-28 NOTE — Telephone Encounter (Signed)
Left message on home phone for the pt to return my call. 

## 2012-08-29 NOTE — Telephone Encounter (Signed)
I spoke to the pt.  She was able to get an appt on her own through [facility].

## 2012-08-30 DIAGNOSIS — M25519 Pain in unspecified shoulder: Secondary | ICD-10-CM | POA: Diagnosis not present

## 2012-09-22 ENCOUNTER — Other Ambulatory Visit: Payer: Self-pay | Admitting: Internal Medicine

## 2012-09-25 DIAGNOSIS — M25519 Pain in unspecified shoulder: Secondary | ICD-10-CM | POA: Diagnosis not present

## 2012-10-11 DIAGNOSIS — H401290 Low-tension glaucoma, unspecified eye, stage unspecified: Secondary | ICD-10-CM | POA: Diagnosis not present

## 2012-10-11 DIAGNOSIS — H409 Unspecified glaucoma: Secondary | ICD-10-CM | POA: Diagnosis not present

## 2012-10-11 DIAGNOSIS — Q150 Congenital glaucoma: Secondary | ICD-10-CM | POA: Diagnosis not present

## 2012-10-16 DIAGNOSIS — M25519 Pain in unspecified shoulder: Secondary | ICD-10-CM | POA: Diagnosis not present

## 2012-10-17 DIAGNOSIS — S42209A Unspecified fracture of upper end of unspecified humerus, initial encounter for closed fracture: Secondary | ICD-10-CM | POA: Diagnosis not present

## 2012-10-17 DIAGNOSIS — M25519 Pain in unspecified shoulder: Secondary | ICD-10-CM | POA: Diagnosis not present

## 2012-10-18 DIAGNOSIS — L905 Scar conditions and fibrosis of skin: Secondary | ICD-10-CM | POA: Diagnosis not present

## 2012-10-19 DIAGNOSIS — M25519 Pain in unspecified shoulder: Secondary | ICD-10-CM | POA: Diagnosis not present

## 2012-10-19 DIAGNOSIS — S42209A Unspecified fracture of upper end of unspecified humerus, initial encounter for closed fracture: Secondary | ICD-10-CM | POA: Diagnosis not present

## 2012-10-23 DIAGNOSIS — M25519 Pain in unspecified shoulder: Secondary | ICD-10-CM | POA: Diagnosis not present

## 2012-10-23 DIAGNOSIS — S42209A Unspecified fracture of upper end of unspecified humerus, initial encounter for closed fracture: Secondary | ICD-10-CM | POA: Diagnosis not present

## 2012-10-26 DIAGNOSIS — M25519 Pain in unspecified shoulder: Secondary | ICD-10-CM | POA: Diagnosis not present

## 2012-10-26 DIAGNOSIS — S42209A Unspecified fracture of upper end of unspecified humerus, initial encounter for closed fracture: Secondary | ICD-10-CM | POA: Diagnosis not present

## 2012-10-31 DIAGNOSIS — M25519 Pain in unspecified shoulder: Secondary | ICD-10-CM | POA: Diagnosis not present

## 2012-10-31 DIAGNOSIS — S42209A Unspecified fracture of upper end of unspecified humerus, initial encounter for closed fracture: Secondary | ICD-10-CM | POA: Diagnosis not present

## 2012-11-03 DIAGNOSIS — M25519 Pain in unspecified shoulder: Secondary | ICD-10-CM | POA: Diagnosis not present

## 2012-11-03 DIAGNOSIS — S42209A Unspecified fracture of upper end of unspecified humerus, initial encounter for closed fracture: Secondary | ICD-10-CM | POA: Diagnosis not present

## 2012-11-07 DIAGNOSIS — S42209A Unspecified fracture of upper end of unspecified humerus, initial encounter for closed fracture: Secondary | ICD-10-CM | POA: Diagnosis not present

## 2012-11-07 DIAGNOSIS — M25519 Pain in unspecified shoulder: Secondary | ICD-10-CM | POA: Diagnosis not present

## 2012-11-10 DIAGNOSIS — S42209A Unspecified fracture of upper end of unspecified humerus, initial encounter for closed fracture: Secondary | ICD-10-CM | POA: Diagnosis not present

## 2012-11-10 DIAGNOSIS — M25519 Pain in unspecified shoulder: Secondary | ICD-10-CM | POA: Diagnosis not present

## 2012-11-13 DIAGNOSIS — M25519 Pain in unspecified shoulder: Secondary | ICD-10-CM | POA: Diagnosis not present

## 2012-11-14 DIAGNOSIS — S42209A Unspecified fracture of upper end of unspecified humerus, initial encounter for closed fracture: Secondary | ICD-10-CM | POA: Diagnosis not present

## 2012-11-14 DIAGNOSIS — M25519 Pain in unspecified shoulder: Secondary | ICD-10-CM | POA: Diagnosis not present

## 2012-11-16 DIAGNOSIS — S42209A Unspecified fracture of upper end of unspecified humerus, initial encounter for closed fracture: Secondary | ICD-10-CM | POA: Diagnosis not present

## 2012-11-16 DIAGNOSIS — M25519 Pain in unspecified shoulder: Secondary | ICD-10-CM | POA: Diagnosis not present

## 2012-11-20 DIAGNOSIS — S42209A Unspecified fracture of upper end of unspecified humerus, initial encounter for closed fracture: Secondary | ICD-10-CM | POA: Diagnosis not present

## 2012-11-20 DIAGNOSIS — M25519 Pain in unspecified shoulder: Secondary | ICD-10-CM | POA: Diagnosis not present

## 2012-11-23 DIAGNOSIS — S42209A Unspecified fracture of upper end of unspecified humerus, initial encounter for closed fracture: Secondary | ICD-10-CM | POA: Diagnosis not present

## 2012-11-23 DIAGNOSIS — M25519 Pain in unspecified shoulder: Secondary | ICD-10-CM | POA: Diagnosis not present

## 2012-11-27 DIAGNOSIS — S42209A Unspecified fracture of upper end of unspecified humerus, initial encounter for closed fracture: Secondary | ICD-10-CM | POA: Diagnosis not present

## 2012-11-27 DIAGNOSIS — M25519 Pain in unspecified shoulder: Secondary | ICD-10-CM | POA: Diagnosis not present

## 2012-11-30 DIAGNOSIS — M25519 Pain in unspecified shoulder: Secondary | ICD-10-CM | POA: Diagnosis not present

## 2012-11-30 DIAGNOSIS — S42209A Unspecified fracture of upper end of unspecified humerus, initial encounter for closed fracture: Secondary | ICD-10-CM | POA: Diagnosis not present

## 2012-12-01 ENCOUNTER — Other Ambulatory Visit: Payer: Self-pay | Admitting: Internal Medicine

## 2012-12-04 DIAGNOSIS — M25519 Pain in unspecified shoulder: Secondary | ICD-10-CM | POA: Diagnosis not present

## 2012-12-04 DIAGNOSIS — S42209A Unspecified fracture of upper end of unspecified humerus, initial encounter for closed fracture: Secondary | ICD-10-CM | POA: Diagnosis not present

## 2012-12-06 DIAGNOSIS — M25519 Pain in unspecified shoulder: Secondary | ICD-10-CM | POA: Diagnosis not present

## 2012-12-06 DIAGNOSIS — S42209A Unspecified fracture of upper end of unspecified humerus, initial encounter for closed fracture: Secondary | ICD-10-CM | POA: Diagnosis not present

## 2012-12-07 ENCOUNTER — Other Ambulatory Visit: Payer: Self-pay

## 2012-12-07 DIAGNOSIS — Z1231 Encounter for screening mammogram for malignant neoplasm of breast: Secondary | ICD-10-CM

## 2012-12-11 DIAGNOSIS — M25519 Pain in unspecified shoulder: Secondary | ICD-10-CM | POA: Diagnosis not present

## 2012-12-11 DIAGNOSIS — S42209A Unspecified fracture of upper end of unspecified humerus, initial encounter for closed fracture: Secondary | ICD-10-CM | POA: Diagnosis not present

## 2012-12-13 DIAGNOSIS — S42209A Unspecified fracture of upper end of unspecified humerus, initial encounter for closed fracture: Secondary | ICD-10-CM | POA: Diagnosis not present

## 2012-12-13 DIAGNOSIS — M25519 Pain in unspecified shoulder: Secondary | ICD-10-CM | POA: Diagnosis not present

## 2012-12-13 DIAGNOSIS — M7500 Adhesive capsulitis of unspecified shoulder: Secondary | ICD-10-CM | POA: Diagnosis not present

## 2012-12-18 DIAGNOSIS — M25519 Pain in unspecified shoulder: Secondary | ICD-10-CM | POA: Diagnosis not present

## 2012-12-18 DIAGNOSIS — M7500 Adhesive capsulitis of unspecified shoulder: Secondary | ICD-10-CM | POA: Diagnosis not present

## 2012-12-20 DIAGNOSIS — M7500 Adhesive capsulitis of unspecified shoulder: Secondary | ICD-10-CM | POA: Diagnosis not present

## 2012-12-20 DIAGNOSIS — M25519 Pain in unspecified shoulder: Secondary | ICD-10-CM | POA: Diagnosis not present

## 2012-12-21 ENCOUNTER — Ambulatory Visit: Payer: Medicare Other

## 2012-12-26 DIAGNOSIS — M25519 Pain in unspecified shoulder: Secondary | ICD-10-CM | POA: Diagnosis not present

## 2012-12-26 DIAGNOSIS — S42209A Unspecified fracture of upper end of unspecified humerus, initial encounter for closed fracture: Secondary | ICD-10-CM | POA: Diagnosis not present

## 2012-12-28 DIAGNOSIS — S42209A Unspecified fracture of upper end of unspecified humerus, initial encounter for closed fracture: Secondary | ICD-10-CM | POA: Diagnosis not present

## 2012-12-28 DIAGNOSIS — M25519 Pain in unspecified shoulder: Secondary | ICD-10-CM | POA: Diagnosis not present

## 2013-01-02 ENCOUNTER — Ambulatory Visit
Admission: RE | Admit: 2013-01-02 | Discharge: 2013-01-02 | Disposition: A | Discharge status: Home / Self Care | Payer: Medicare Other | Source: Ambulatory Visit

## 2013-01-02 DIAGNOSIS — Z1231 Encounter for screening mammogram for malignant neoplasm of breast: Secondary | ICD-10-CM

## 2013-01-02 DIAGNOSIS — M25519 Pain in unspecified shoulder: Secondary | ICD-10-CM | POA: Diagnosis not present

## 2013-01-02 DIAGNOSIS — S42209A Unspecified fracture of upper end of unspecified humerus, initial encounter for closed fracture: Secondary | ICD-10-CM | POA: Diagnosis not present

## 2013-01-04 DIAGNOSIS — M25519 Pain in unspecified shoulder: Secondary | ICD-10-CM | POA: Diagnosis not present

## 2013-01-04 DIAGNOSIS — S42209A Unspecified fracture of upper end of unspecified humerus, initial encounter for closed fracture: Secondary | ICD-10-CM | POA: Diagnosis not present

## 2013-01-08 ENCOUNTER — Other Ambulatory Visit: Payer: Self-pay | Admitting: Internal Medicine

## 2013-01-08 DIAGNOSIS — S42209A Unspecified fracture of upper end of unspecified humerus, initial encounter for closed fracture: Secondary | ICD-10-CM | POA: Diagnosis not present

## 2013-01-08 DIAGNOSIS — M25519 Pain in unspecified shoulder: Secondary | ICD-10-CM | POA: Diagnosis not present

## 2013-01-08 DIAGNOSIS — R928 Other abnormal and inconclusive findings on diagnostic imaging of breast: Secondary | ICD-10-CM

## 2013-01-10 DIAGNOSIS — M25519 Pain in unspecified shoulder: Secondary | ICD-10-CM | POA: Diagnosis not present

## 2013-01-11 DIAGNOSIS — S42209A Unspecified fracture of upper end of unspecified humerus, initial encounter for closed fracture: Secondary | ICD-10-CM | POA: Diagnosis not present

## 2013-01-11 DIAGNOSIS — M25519 Pain in unspecified shoulder: Secondary | ICD-10-CM | POA: Diagnosis not present

## 2013-01-16 DIAGNOSIS — S42209A Unspecified fracture of upper end of unspecified humerus, initial encounter for closed fracture: Secondary | ICD-10-CM | POA: Diagnosis not present

## 2013-01-16 DIAGNOSIS — M25519 Pain in unspecified shoulder: Secondary | ICD-10-CM | POA: Diagnosis not present

## 2013-01-18 ENCOUNTER — Telehealth: Payer: Self-pay | Admitting: Family Medicine

## 2013-01-18 ENCOUNTER — Encounter: Payer: Self-pay | Admitting: Family Medicine

## 2013-01-18 ENCOUNTER — Ambulatory Visit (INDEPENDENT_AMBULATORY_CARE_PROVIDER_SITE_OTHER): Payer: Medicare Other | Admitting: Family Medicine

## 2013-01-18 ENCOUNTER — Telehealth: Payer: Self-pay | Admitting: Internal Medicine

## 2013-01-18 VITALS — BP 130/80 | Temp 97.8°F | Wt 200.0 lb

## 2013-01-18 DIAGNOSIS — R21 Rash and other nonspecific skin eruption: Secondary | ICD-10-CM | POA: Diagnosis not present

## 2013-01-18 DIAGNOSIS — M25519 Pain in unspecified shoulder: Secondary | ICD-10-CM | POA: Diagnosis not present

## 2013-01-18 DIAGNOSIS — S42209A Unspecified fracture of upper end of unspecified humerus, initial encounter for closed fracture: Secondary | ICD-10-CM | POA: Diagnosis not present

## 2013-01-18 MED ORDER — NYSTATIN 100000 UNIT/GM EX CREA: TOPICAL_CREAM | Freq: Two times a day (BID) | CUTANEOUS | Status: DC

## 2013-01-18 NOTE — Progress Notes (Signed)
Chief Complaint  Patient presents with  . rash under breast    HPI:  Marie Kelly, a 67 yo patient of Dr. Fabian Sharp, is here for a rash on her trunk: -started a few days ago -itchy -has had before and cream took care of it -denies: fevers, pain, other symptoms  ROS: See pertinent positives and negatives per HPI.  Past Medical History  Diagnosis Date  . Colon polyps   . GERD (gastroesophageal reflux disease)     had endo? taking nexium 3 x per week  . Diverticulosis   . Hyperlipidemia     had been on lipitor changes to pravachol cause of cost and tricor has since stopped  NO  se reported   . Anemia   . Impaired fasting glucose   . Hypothyroidism      on meds for years    Past Surgical History  Procedure Laterality Date  . Tubal ligation    . Cholecystectomy    . Uterus repair    . Hemorroidectomy      Family History  Problem Relation Age of Onset  . Stroke Mother   . Diabetes Father   . Alzheimer's disease      History   Social History  . Marital Status: Married    Spouse Name: N/A    Number of Children: N/A  . Years of Education: N/A   Social History Main Topics  . Smoking status: Former Games developer  . Smokeless tobacco: None  . Alcohol Use: Yes     Comment: socially  . Drug Use: No  . Sexual Activity: None   Other Topics Concern  . 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    Gravida 3 para 3   Last td 6 2008   HS educated       REmote hx of tobacco 1.5 ppd for 6 years as a teen Q 21    Current outpatient prescriptions:fluticasone (FLONASE) 50 MCG/ACT nasal spray, Place 2 sprays into the nose daily., Disp: , Rfl: ;  Fluticasone-Salmeterol (ADVAIR) 250-50 MCG/DOSE AEPB, Inhale 1 puff into the lungs every 12 (twelve) hours., Disp: , Rfl: ;  latanoprost (XALATAN) 0.005 % ophthalmic solution, 1  drop at bedtime., Disp: , Rfl: ;  levothyroxine (SYNTHROID, LEVOTHROID) 50 MCG tablet, TAKE 1 TABLET DAILY, Disp: 90 tablet, Rfl: 0 montelukast (SINGULAIR) 10 MG tablet, TAKE 1 TABLET AT BEDTIME, Disp: 90 tablet, Rfl: 0;  tolterodine (DETROL LA) 2 MG 24 hr capsule, Take 2 mg by mouth daily., Disp: , Rfl: ;  nystatin cream (MYCOSTATIN), Apply topically 2 (two) times daily., Disp: 30 g, Rfl: 0  EXAM:  Filed Vitals:   01/18/13 0858  BP: 130/80  Temp: 97.8 F (36.6 C)    Body mass index is 33.81 kg/(m^2).  GENERAL: vitals reviewed and listed above, alert, oriented, appears well hydrated and in no acute distress  HEENT: atraumatic, conjunttiva clear, no obvious abnormalities on inspection of external nose and ears  NECK: no obvious masses on inspection  SKIN: scally erythematous patchy rash under both breasts  MS: moves all extremities without noticeable abnormality  PSYCH: pleasant and cooperative, no obvious depression or anxiety  ASSESSMENT AND PLAN:  Discussed the following assessment and plan:  Rash and nonspecific  skin eruption - Plan: nystatin cream (MYCOSTATIN) -looks like yeast -Recommendations per orders and instructions, risks and use of medications and return precautions discussed. -Patient advised to return or notify a doctor immediately if symptoms worsen or persist or new concerns arise.  Patient Instructions  -use cream per instructions, let your doctor know if does not resolve in next 1-2 weeks or if worsens or other symptoms     Brynnly Bonet R.

## 2013-01-18 NOTE — Telephone Encounter (Signed)
Called rx to pharmacist.

## 2013-01-18 NOTE — Telephone Encounter (Signed)
Doctor from out of town informed patient she is now due for her colonoscopy.  Pt would like to see Dr. Juanda Chance.  For further questions please see Dr. Selena Batten. Okay to order?

## 2013-01-18 NOTE — Telephone Encounter (Signed)
Pt states that the pharmacy does not have her nystatin cream (MYCOSTATIN) RX. Please assist.

## 2013-01-18 NOTE — Patient Instructions (Signed)
-  use cream per instructions, let your doctor know if does not resolve in next 1-2 weeks or if worsens or other symptoms

## 2013-01-19 ENCOUNTER — Other Ambulatory Visit: Payer: Self-pay | Admitting: Family Medicine

## 2013-01-19 DIAGNOSIS — Z1211 Encounter for screening for malignant neoplasm of colon: Secondary | ICD-10-CM

## 2013-01-19 NOTE — Telephone Encounter (Signed)
Ok to refer as asked

## 2013-01-19 NOTE — Telephone Encounter (Signed)
Order placed in the system. 

## 2013-01-22 DIAGNOSIS — M25519 Pain in unspecified shoulder: Secondary | ICD-10-CM | POA: Diagnosis not present

## 2013-01-22 DIAGNOSIS — S42209A Unspecified fracture of upper end of unspecified humerus, initial encounter for closed fracture: Secondary | ICD-10-CM | POA: Diagnosis not present

## 2013-02-05 ENCOUNTER — Ambulatory Visit
Admission: RE | Admit: 2013-02-05 | Discharge: 2013-02-05 | Disposition: A | Discharge status: Home / Self Care | Payer: Medicare Other | Source: Ambulatory Visit | Attending: Internal Medicine | Admitting: Internal Medicine

## 2013-02-05 DIAGNOSIS — R928 Other abnormal and inconclusive findings on diagnostic imaging of breast: Secondary | ICD-10-CM | POA: Diagnosis not present

## 2013-02-07 ENCOUNTER — Encounter: Payer: Self-pay | Admitting: Internal Medicine

## 2013-02-07 DIAGNOSIS — M25519 Pain in unspecified shoulder: Secondary | ICD-10-CM | POA: Diagnosis not present

## 2013-02-15 DIAGNOSIS — D485 Neoplasm of uncertain behavior of skin: Secondary | ICD-10-CM | POA: Diagnosis not present

## 2013-02-15 DIAGNOSIS — L538 Other specified erythematous conditions: Secondary | ICD-10-CM | POA: Diagnosis not present

## 2013-03-01 ENCOUNTER — Other Ambulatory Visit: Payer: Self-pay | Admitting: Internal Medicine

## 2013-03-09 DIAGNOSIS — Z23 Encounter for immunization: Secondary | ICD-10-CM | POA: Diagnosis not present

## 2013-03-14 DIAGNOSIS — H401290 Low-tension glaucoma, unspecified eye, stage unspecified: Secondary | ICD-10-CM | POA: Diagnosis not present

## 2013-03-14 DIAGNOSIS — H409 Unspecified glaucoma: Secondary | ICD-10-CM | POA: Diagnosis not present

## 2013-03-21 ENCOUNTER — Ambulatory Visit (INDEPENDENT_AMBULATORY_CARE_PROVIDER_SITE_OTHER): Payer: Medicare Other | Admitting: Internal Medicine

## 2013-03-21 ENCOUNTER — Encounter: Payer: Self-pay | Admitting: Internal Medicine

## 2013-03-21 VITALS — BP 162/86 | HR 66 | Temp 98.1°F | Wt 192.0 lb

## 2013-03-21 DIAGNOSIS — K6281 Anal sphincter tear (healed) (nontraumatic) (old): Secondary | ICD-10-CM

## 2013-03-21 DIAGNOSIS — K5900 Constipation, unspecified: Secondary | ICD-10-CM | POA: Diagnosis not present

## 2013-03-21 DIAGNOSIS — S31831A Laceration without foreign body of anus, initial encounter: Secondary | ICD-10-CM

## 2013-03-21 DIAGNOSIS — R030 Elevated blood-pressure reading, without diagnosis of hypertension: Secondary | ICD-10-CM

## 2013-03-21 DIAGNOSIS — K625 Hemorrhage of anus and rectum: Secondary | ICD-10-CM | POA: Diagnosis not present

## 2013-03-21 DIAGNOSIS — N3281 Overactive bladder: Secondary | ICD-10-CM

## 2013-03-21 DIAGNOSIS — R198 Other specified symptoms and signs involving the digestive system and abdomen: Secondary | ICD-10-CM

## 2013-03-21 DIAGNOSIS — N318 Other neuromuscular dysfunction of bladder: Secondary | ICD-10-CM

## 2013-03-21 MED ORDER — TOLTERODINE TARTRATE ER 2 MG PO CP24: 2.0000 mg | ORAL_CAPSULE | Freq: Every day | ORAL | Status: DC

## 2013-03-21 NOTE — Progress Notes (Signed)
Chief Complaint  Patient presents with  . Rectal Bleeding    Gave herself an enema on Monday.  Is not showing a lot of blood.  . Constipation    HPI: Patient comes in today for SDA for  new problem evaluation.  After travel had some constipation took 2 x lax and then  Had rectal pain and  Did an enema  2 days ago and then  Able to go. And then painful  Like a tear .Marland Kitchen  And now sore  usign prep h and stool softener.   Blood dripping in toilet.  whe happened  Now just sore  Using prep H  Never had this happened before and bowel habits were normal pre travel. no  Remote hxof hemorr surgery    No fever weight loss systemic sx but had rectal pain with this issue says she was impacted.   BP has been good outside of office 130 range ROS: See pertinent positives and negatives per HPI.  Past Medical History  Diagnosis Date  . Colon polyps   . GERD (gastroesophageal reflux disease)     had endo? taking nexium 3 x per week  . Diverticulosis   . Hyperlipidemia     had been on lipitor changes to pravachol cause of cost and tricor has since stopped  NO  se reported   . Anemia   . Impaired fasting glucose   . Hypothyroidism      on meds for years    Family History  Problem Relation Age of Onset  . Stroke Mother   . Diabetes Father   . Alzheimer's disease      History   Social History  . Marital Status: Married    Spouse Name: N/A    Number of Children: N/A  . Years of Education: N/A   Social History Main Topics  . Smoking status: Former Games developer  . Smokeless tobacco: None  . Alcohol Use: Yes     Comment: socially  . Drug Use: No  . Sexual Activity: None   Other Topics Concern  . 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    Gravida 3 para 3   Last td 6 2008   HS educated       REmote hx of tobacco 1.5 ppd for  6 years as a teen Q 81    Outpatient Encounter Prescriptions as of 03/21/2013  Medication Sig  . Docusate Calcium (STOOL SOFTENER PO) Take by mouth.  . fluticasone (FLONASE) 50 MCG/ACT nasal spray Place 2 sprays into the nose daily.  . Fluticasone-Salmeterol (ADVAIR) 250-50 MCG/DOSE AEPB Inhale 1 puff into the lungs every 12 (twelve) hours.  Marland Kitchen latanoprost (XALATAN) 0.005 % ophthalmic solution 1 drop at bedtime.  Marland Kitchen levothyroxine (SYNTHROID, LEVOTHROID) 50 MCG tablet TAKE 1 TABLET DAILY  . montelukast (SINGULAIR) 10 MG tablet TAKE 1 TABLET AT BEDTIME  . phenylephrine-shark liver oil-mineral oil-petrolatum (PREPARATION H) 0.25-3-14-71.9 % rectal ointment Place 1 application rectally 2 (two) times daily as needed for hemorrhoids.  Marland Kitchen tolterodine (DETROL LA) 2 MG 24 hr capsule Take 1 capsule (2 mg total) by mouth daily.  . [DISCONTINUED] tolterodine (DETROL LA) 2 MG 24 hr capsule Take 2 mg by mouth daily.  Marland Kitchen nystatin cream (  MYCOSTATIN) Apply topically 2 (two) times daily.    EXAM:  BP 162/86  Pulse 66  Temp(Src) 98.1 F (36.7 C) (Oral)  Wt 192 lb (87.091 kg)  SpO2 99%  Body mass index is 32.46 kg/(m^2).  GENERAL: vitals reviewed and listed above, alert, oriented, appears well hydrated and in no acute distress  HEENT: atraumatic, conjunctiva  clear, no obvious abnormalities on inspection of external nose and ears  NECK: no obvious masses on inspection palpation   Abdomen:  Sof,t normal bowel sounds without hepatosplenomegaly, no guarding rebound or masses no CVA tenderness Ano rectal area  Old tags some raw and irritated  Rectal painful an no masses to 4 cm no stool  Smear BR on glove  MS: moves all extremities without noticeable focal  abnormality  PSYCH: pleasant and cooperative, no obvious depression or anxiety  ASSESSMENT AND PLAN:  Discussed the following assessment and plan:  Painful defecation - 1 episode travel used enema no chornic change bowel habits.   Anal  tear  Rectal bleeding  Elevated blood pressure reading - was 130 at home bring in  machine to cpx next month  OAB (overactive bladder) - on meds for years  helpful Assume tear related to large BM    Expectant management. Stool softener   Proceed with colon in early December . However oif recurring pain bleeding etc  Contact us for earlier evaluation. Has cpx appt also in december . -Patient advised to return or notify health care team  if symptoms worsen or persist or new concerns arise.  Patient Instructions   Your blood pressure is elevated today and should be treated if continues .   Ok to refill the detrol  Advise  You see follow through   For your colonoscop[y and evaluation. If there is recurrent problem then we can contact gi earlier if needed.  This is probably a small tear from painful large BM  Stool softeners and time to heal .      Marie Kelly. Marie Kelly M.D.

## 2013-03-21 NOTE — Patient Instructions (Addendum)
  Your blood pressure is elevated today and should be treated if continues .   Ok to refill the detrol  Advise  You see follow through   For your colonoscop[y and evaluation. If there is recurrent problem then we can contact gi earlier if needed.  This is probably a small tear from painful large BM  Stool softeners and time to heal .

## 2013-04-04 ENCOUNTER — Ambulatory Visit (AMBULATORY_SURGERY_CENTER): Payer: Medicare Other

## 2013-04-04 VITALS — Ht 64.5 in | Wt 184.0 lb

## 2013-04-04 DIAGNOSIS — Z860100 Personal history of colon polyps, unspecified: Secondary | ICD-10-CM

## 2013-04-04 MED ORDER — MOVIPREP 100 G PO SOLR: 1.0000 | Freq: Once | ORAL | Status: DC

## 2013-04-05 ENCOUNTER — Encounter: Payer: Self-pay | Admitting: Internal Medicine

## 2013-04-23 ENCOUNTER — Ambulatory Visit (INDEPENDENT_AMBULATORY_CARE_PROVIDER_SITE_OTHER): Payer: Medicare Other | Admitting: Internal Medicine

## 2013-04-23 ENCOUNTER — Telehealth: Payer: Self-pay | Admitting: Internal Medicine

## 2013-04-23 ENCOUNTER — Encounter: Payer: Self-pay | Admitting: Internal Medicine

## 2013-04-23 VITALS — BP 136/76 | Temp 97.6°F | Ht 63.5 in | Wt 191.0 lb

## 2013-04-23 DIAGNOSIS — H409 Unspecified glaucoma: Secondary | ICD-10-CM

## 2013-04-23 DIAGNOSIS — Z0000 Encounter for general adult medical examination without abnormal findings: Secondary | ICD-10-CM

## 2013-04-23 DIAGNOSIS — E039 Hypothyroidism, unspecified: Secondary | ICD-10-CM | POA: Diagnosis not present

## 2013-04-23 DIAGNOSIS — E785 Hyperlipidemia, unspecified: Secondary | ICD-10-CM | POA: Diagnosis not present

## 2013-04-23 DIAGNOSIS — Z23 Encounter for immunization: Secondary | ICD-10-CM

## 2013-04-23 DIAGNOSIS — R7301 Impaired fasting glucose: Secondary | ICD-10-CM | POA: Diagnosis not present

## 2013-04-23 DIAGNOSIS — I499 Cardiac arrhythmia, unspecified: Secondary | ICD-10-CM

## 2013-04-23 DIAGNOSIS — M1711 Unilateral primary osteoarthritis, right knee: Secondary | ICD-10-CM

## 2013-04-23 DIAGNOSIS — M1710 Unilateral primary osteoarthritis, unspecified knee: Secondary | ICD-10-CM

## 2013-04-23 LAB — CBC WITH DIFFERENTIAL/PLATELET
Basophils Relative: 0.1 % (ref 0.0–3.0)
Eosinophils Relative: 3.7 % (ref 0.0–5.0)
HCT: 42.2 % (ref 36.0–46.0)
Hemoglobin: 13.9 g/dL (ref 12.0–15.0)
Lymphocytes Relative: 17.9 % (ref 12.0–46.0)
Lymphs Abs: 1.3 10*3/uL (ref 0.7–4.0)
Monocytes Relative: 8.9 % (ref 3.0–12.0)
Neutro Abs: 4.9 10*3/uL (ref 1.4–7.7)
RBC: 5.04 Mil/uL (ref 3.87–5.11)
RDW: 16.1 % — ABNORMAL HIGH (ref 11.5–14.6)
WBC: 7.1 10*3/uL (ref 4.5–10.5)

## 2013-04-23 LAB — BASIC METABOLIC PANEL
GFR: 70.69 mL/min (ref >60.00)
Glucose, Bld: 89 mg/dL (ref 70–99)
Potassium: 4.3 mEq/L (ref 3.5–5.1)
Sodium: 141 mEq/L (ref 135–145)

## 2013-04-23 LAB — HEPATIC FUNCTION PANEL
ALT: 39 U/L — ABNORMAL HIGH (ref 0–35)
AST: 26 U/L (ref 0–37)
Albumin: 4.6 g/dL (ref 3.5–5.2)
Alkaline Phosphatase: 41 U/L (ref 39–117)
Total Protein: 7.7 g/dL (ref 6.0–8.3)

## 2013-04-23 LAB — LDL CHOLESTEROL, DIRECT: Direct LDL: 168.5 mg/dL

## 2013-04-23 LAB — LIPID PANEL: Cholesterol: 225 mg/dL — ABNORMAL HIGH (ref 0–200)

## 2013-04-23 NOTE — Telephone Encounter (Signed)
Patient had PCP visit today with Dr.Panosh. She told patient her heart was irregular and the EKG was irregular and wants patient to see cardiologist. Patient is waiting to hear back from PCP with cardiologist appointment. Patient is very concerned about having procedure and drinking moviprep, patient wants to cancel colonoscopy at this time. Patient will call us back after she is cleared from cardiologist. Colonoscopy appointment cancelled.

## 2013-04-23 NOTE — Telephone Encounter (Signed)
OK 

## 2013-04-23 NOTE — Progress Notes (Signed)
Chief Complaint  Patient presents with  . Medicare Wellness    HPI: Patient comes in today for Preventive Medicare wellness visit . No wellnes visit in   Recent past No major injuries, ed visits ,hospitalizations , new medications since last visit. Right knee was sup[posed to have knee surgery and replacment when in [place] and now gettging worse.   Has colonoscopy this week.    Hearing:  Hearing   Vision:  No limitations at present . glaucomea nd to see dr Kathie Rhodes recnetly  Last eye check UTD  Safety:  Has smoke detector and wears seat belts.  No firearms. No excess sun exposure. Sees dentist regularly.  Falls: no  Advance directive :  Reviewed    Memory: Felt to be good  , no concern from her or her family.   Depression: No anhedonia unusual crying or depressive symptoms  Nutrition: Eats well balanced diet; adequate calcium and vitamin D. No swallowing chewing problems.  Injury: no major injuries in the last six months.  Other healthcare providers:  Reviewed today .  Social:  Lives with spouse married. No pets.   Preventive parameters: up-to-date  Reviewed   ADLS:   There are no problems or need for assistance  driving, feeding, obtaining food, dressing, toileting and bathing, managing money using phone. She is independent.  EXERCISE/ HABITS   Knee limiting Per week   No tobacco    etoh ocass   Health Maintenance  Topic Date Due  . Pneumococcal Polysaccharide Vaccine Age 45 And Over  04/22/2010  . Influenza Vaccine  12/15/2013  . Mammogram  02/06/2015  . Colonoscopy  06/29/2019  . Tetanus/tdap  09/08/2021  . Zostavax  Completed   Health Maintenance Review   ROS:  GEN/ HEENT: No fever, significant weight changes sweats headaches vision problems hearing changes, CV/ PULM; No chest pain shortness of breath cough, syncope,edema  change in exercise tolerance. GI /GU: No adominal pain, vomiting, change in bowel habits. No blood in the stool. No significant GU  symptoms. SKIN/HEME: ,no acute skin rashes suspicious lesions or bleeding. No lymphadenopathy, nodules, masses.  NEURO/ PSYCH:  No neurologic signs such as weakness numbness. No depression anxiety. IMM/ Allergy: No unusual infections.  Allergy .   REST of 12 system review negative except as per HPI   Past Medical History  Diagnosis Date  . Colon polyps   . GERD (gastroesophageal reflux disease)     had endo? taking nexium 3 x per week  . Diverticulosis   . Hyperlipidemia     had been on lipitor changes to pravachol cause of cost and tricor has since stopped  NO  se reported   . Anemia   . Impaired fasting glucose   . Hypothyroidism      on meds for years    Family History  Problem Relation Age of Onset  . Stroke Mother   . Diabetes Father   . Alzheimer's disease    . Stomach cancer Maternal Aunt   . Colon cancer Neg Hx   . Pancreatic cancer Neg Hx     History   Social History  . Marital Status: Married    Spouse Name: N/A    Number of Children: N/A  . Years of Education: N/A   Social History Main Topics  . Smoking status: Former Games developer  . Smokeless tobacco: Never Used  . Alcohol Use: Yes     Comment: socially  . Drug Use: No  . Sexual Activity: None  Other Topics Concern  . 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    Gravida 3 para 3   Last td 6 2008   HS educated       REmote hx of tobacco 1.5 ppd for 6 years as a teen Q 28    Outpatient Encounter Prescriptions as of 04/23/2013  Medication Sig  . fluticasone (FLONASE) 50 MCG/ACT nasal spray Place 2 sprays into the nose daily.  . Fluticasone-Salmeterol (ADVAIR) 250-50 MCG/DOSE AEPB Inhale 1 puff into the lungs every 12 (twelve) hours.  Marland Kitchen latanoprost (XALATAN) 0.005 % ophthalmic solution 1 drop at bedtime.  Marland Kitchen levothyroxine (SYNTHROID,  LEVOTHROID) 50 MCG tablet TAKE 1 TABLET DAILY  . montelukast (SINGULAIR) 10 MG tablet TAKE 1 TABLET AT BEDTIME  . tolterodine (DETROL LA) 2 MG 24 hr capsule Take 1 capsule (2 mg total) by mouth daily.  Marland Kitchen MOVIPREP 100 G SOLR Take 1 kit (200 g total) by mouth once.  . [DISCONTINUED] Docusate Calcium (STOOL SOFTENER PO) Take by mouth.  . [DISCONTINUED] phenylephrine-shark liver oil-mineral oil-petrolatum (PREPARATION H) 0.25-3-14-71.9 % rectal ointment Place 1 application rectally 2 (two) times daily as needed for hemorrhoids.    EXAM:  BP 136/76  Temp(Src) 97.6 F (36.4 C) (Oral)  Ht 5' 3.5" (1.613 m)  Wt 191 lb (86.637 kg)  BMI 33.30 kg/m2  Body mass index is 33.3 kg/(m^2).  Physical Exam: Vital signs reviewed ZOX:WRUE is a well-developed well-nourished alert cooperative   who appears stated age in no acute distress.  HEENT: normocephalic atraumatic , Eyes: PERRL EOM's full, conjunctiva clear, Nares: paten,t no deformity discharge or tenderness., Ears: no deformity EAC's clear TMs with normal landmarks. Mouth: clear OP, no lesions, edema.  Moist mucous membranes. Dentition in adequate repair. NECK: supple without masses, thyromegaly or bruits. CHEST/PULM:  Clear to auscultation and percussion breath sounds equal no wheeze , rales or rhonchi. No chest wall deformities or tenderness. Breast: normal by inspection . No dimpling, discharge, masses, tenderness or discharge . CV: PMI is nondisplaced, S1 S2 no gallops, murmurs, rubs. Peripheral pulses are full without delay.No JVD .  irreg rhythym bigeminy?  ABDOMEN: Bowel sounds normal nontender  No guard or rebound, no hepato splenomegal no CVA tenderness.  No hernia. Extremtities:  No clubbing cyanosis or edema, no acute joint swelling or redness no focal atrophy right knee hypertrophied no effusion NEURO:  Oriented x3, cranial nerves 3-12 appear to be intact, no obvious focal weakness,gait within normal limits no abnormal reflexes or  asymmetrical SKIN: No acute rashes normal turgor, color, no bruising or petechiae. PSYCH: Oriented, good eye contact, no obvious depression anxiety, cognition and judgment appear normal. LN: no cervical axillary inguinal adenopathy No noted deficits in memory, attention, and speech.   Lab Results  Component Value Date   WBC 7.1 04/23/2013   HGB 13.9 04/23/2013   HCT 42.2 04/23/2013   PLT 228.0 04/23/2013   GLUCOSE 89 04/23/2013   CHOL 225* 04/23/2013   TRIG 80.0 04/23/2013   HDL 47.80 04/23/2013   LDLDIRECT 168.5 04/23/2013   LDLCALC 113* 08/27/2011   ALT 39* 04/23/2013   AST 26 04/23/2013   NA 141 04/23/2013   K 4.3 04/23/2013   CL 106 04/23/2013   CREATININE 0.9 04/23/2013  BUN 19 04/23/2013   CO2 23 04/23/2013   TSH 1.95 04/23/2013   HGBA1C 5.8 08/27/2011   ekg shows bigeminy seems to be persistent ASSESSMENT AND PLAN:  Discussed the following assessment and plan:  Encounter for Medicare annual wellness exam - Plan: Basic metabolic panel, CBC with Differential, Hepatic function panel, Lipid panel, TSH  Irregular heart rate - bigeminy   - Plan: Basic metabolic panel, CBC with Differential, Hepatic function panel, Lipid panel, TSH, EKG 12-Lead, Ambulatory referral to Cardiology  Hyperlipidemia - Plan: Basic metabolic panel, CBC with Differential, Hepatic function panel, Lipid panel, TSH  Hypothyroidism - Plan: Basic metabolic panel, CBC with Differential, Hepatic function panel, Lipid panel, TSH  Impaired fasting glucose - check lab today  - Plan: Basic metabolic panel, CBC with Differential, Hepatic function panel, Lipid panel, TSH  Need for vaccination with 13-polyvalent pneumococcal conjugate vaccine - Plan: Pneumococcal conjugate vaccine 13-valent  Arthritis of right knee  Early stage glaucoma  Patient Care Team: Madelin Headings, MD as PCP - General (Internal Medicine) Richarda Overlie, MD (Ophthalmology)  Patient Instructions  Will notify you  of labs when  available. prevnar 13 today   See ortho about your right knee arthritis .  We'll get cardiology to see you in nonurgently about your persistent bigeminy   Burna Mortimer K. Panosh M.D.  Health Maintenance  Topic Date Due  . Pneumococcal Polysaccharide Vaccine Age 72 And Over  04/22/2010  . Influenza Vaccine  12/15/2013  . Mammogram  02/06/2015  . Colonoscopy  06/29/2019  . Tetanus/tdap  09/08/2021  . Zostavax  Completed   Health Maintenance Review prevnar today   Counseled regarding healthy nutrition, exercise, sleep, injury prevention, calcium vit d and healthy weight .  Pre visit review using our clinic review tool, if applicable. No additional management support is needed unless otherwise documented below in the visit note.

## 2013-04-23 NOTE — Patient Instructions (Addendum)
Will notify you  of labs when available. prevnar 13 today   See ortho about your right knee arthritis .  We'll get cardiology to see you in nonurgently about your persistent bigeminy

## 2013-04-25 ENCOUNTER — Encounter: Payer: Medicare Other | Admitting: Internal Medicine

## 2013-04-25 DIAGNOSIS — M1711 Unilateral primary osteoarthritis, right knee: Secondary | ICD-10-CM | POA: Insufficient documentation

## 2013-04-25 DIAGNOSIS — I499 Cardiac arrhythmia, unspecified: Secondary | ICD-10-CM | POA: Insufficient documentation

## 2013-04-30 ENCOUNTER — Ambulatory Visit: Payer: Medicare Other | Admitting: Cardiology

## 2013-05-01 ENCOUNTER — Other Ambulatory Visit: Payer: Self-pay | Admitting: Family Medicine

## 2013-05-01 DIAGNOSIS — R945 Abnormal results of liver function studies: Secondary | ICD-10-CM

## 2013-05-18 DIAGNOSIS — H401290 Low-tension glaucoma, unspecified eye, stage unspecified: Secondary | ICD-10-CM | POA: Diagnosis not present

## 2013-05-18 DIAGNOSIS — H409 Unspecified glaucoma: Secondary | ICD-10-CM | POA: Diagnosis not present

## 2013-05-18 DIAGNOSIS — H02409 Unspecified ptosis of unspecified eyelid: Secondary | ICD-10-CM | POA: Diagnosis not present

## 2013-05-18 DIAGNOSIS — H02839 Dermatochalasis of unspecified eye, unspecified eyelid: Secondary | ICD-10-CM | POA: Diagnosis not present

## 2013-05-30 ENCOUNTER — Other Ambulatory Visit: Payer: Self-pay | Admitting: Internal Medicine

## 2013-06-01 ENCOUNTER — Encounter: Payer: Self-pay | Admitting: Cardiology

## 2013-06-01 ENCOUNTER — Ambulatory Visit (INDEPENDENT_AMBULATORY_CARE_PROVIDER_SITE_OTHER): Payer: Medicare Other | Admitting: Cardiology

## 2013-06-01 ENCOUNTER — Encounter: Payer: Self-pay | Admitting: *Deleted

## 2013-06-01 VITALS — BP 176/74 | HR 48 | Ht 64.0 in | Wt 183.0 lb

## 2013-06-01 DIAGNOSIS — I4949 Other premature depolarization: Secondary | ICD-10-CM

## 2013-06-01 DIAGNOSIS — E785 Hyperlipidemia, unspecified: Secondary | ICD-10-CM

## 2013-06-01 DIAGNOSIS — I493 Ventricular premature depolarization: Secondary | ICD-10-CM

## 2013-06-01 NOTE — Assessment & Plan Note (Addendum)
Managed by primary care. 

## 2013-06-01 NOTE — Patient Instructions (Signed)
Your physician recommends that you schedule a follow-up appointment in: AS NEEDED PENDING TEST RESULTS  Your physician has requested that you have an echocardiogram. Echocardiography is a painless test that uses sound waves to create images of your heart. It provides your doctor with information about the size and shape of your heart and how well your heart's chambers and valves are working. This procedure takes approximately one hour. There are no restrictions for this procedure.

## 2013-06-01 NOTE — Progress Notes (Signed)
HPI: 68 yo female for evaluation of PVCs and bigeminy. Labs 12/14 showed normal K, Hgb and TSH. The patient denies dyspnea on exertion, orthopnea, PND, pedal edema, palpitations, syncope or exertional chest pain. Recently noted to have ventricular bigeminy on her electrocardiogram and cardiology asked to evaluate.   Current Outpatient Prescriptions  Medication Sig Dispense Refill  . fluticasone (FLONASE) 50 MCG/ACT nasal spray Place 2 sprays into the nose daily.      . Fluticasone-Salmeterol (ADVAIR) 250-50 MCG/DOSE AEPB Inhale 1 puff into the lungs every 12 (twelve) hours.      Marland Kitchen latanoprost (XALATAN) 0.005 % ophthalmic solution 1 drop at bedtime.      Marland Kitchen levothyroxine (SYNTHROID, LEVOTHROID) 50 MCG tablet TAKE 1 TABLET DAILY  90 tablet  0  . montelukast (SINGULAIR) 10 MG tablet TAKE 1 TABLET AT BEDTIME  90 tablet  0  . MOVIPREP 100 G SOLR Take 1 kit (200 g total) by mouth once.  1 kit  0  . tolterodine (DETROL LA) 2 MG 24 hr capsule Take 1 capsule (2 mg total) by mouth daily.  90 capsule  3  . [DISCONTINUED] tolterodine (DETROL LA) 2 MG 24 hr capsule Take 2 mg by mouth daily.       No current facility-administered medications for this visit.    Allergies  Allergen Reactions  . Hydrochlorothiazide     Muscles tightness and felt like she was hit by a truck    Past Medical History  Diagnosis Date  . Colon polyps   . GERD (gastroesophageal reflux disease)     had endo? taking nexium 3 x per week  . Diverticulosis   . Hyperlipidemia     had been on lipitor changes to pravachol cause of cost and tricor has since stopped  NO  se reported   . Anemia   . Impaired fasting glucose   . Hypothyroidism      on meds for years  . Asthma     Past Surgical History  Procedure Laterality Date  . Tubal ligation    . Cholecystectomy    . Uterus repair    . Hemorroidectomy    . Tonsillectomy      History   Social History  . Marital Status: Married    Spouse Name: N/A    Number  of Children: 3  . Years of Education: N/A   Occupational History  . Not on file.   Social History Main Topics  . Smoking status: Former Research scientist (life sciences)  . Smokeless tobacco: Never Used  . Alcohol Use: Yes     Comment: socially  . Drug Use: No  . Sexual Activity: Not on file   Other Topics Concern  . 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    Gravida 3 para 3   Last td 6 2008   HS educated       REmote hx of tobacco 1.5 ppd for 6 years as a teen Q 69    Family History  Problem Relation Age of Onset  . Stroke Mother   . Diabetes Father   . Alzheimer's disease    . Stomach cancer Maternal Aunt   . Colon cancer Neg Hx   . Pancreatic  cancer Neg Hx   . Heart disease Father     ROS: no fevers or chills, productive cough, hemoptysis, dysphasia, odynophagia, melena, hematochezia, dysuria, hematuria, rash, seizure activity, orthopnea, PND, pedal edema, claudication. Remaining systems are negative.  Physical Exam:   Blood pressure 176/74, pulse 48, height 5' 4"  (1.626 m), weight 183 lb (83.008 kg).  General:  Well developed/well nourished in NAD Skin warm/dry Patient not depressed No peripheral clubbing Back-normal HEENT-normal/normal eyelids Neck supple/normal carotid upstroke bilaterally; no bruits; no JVD; no thyromegaly chest - CTA/ normal expansion CV - RRR/normal S1 and S2; no murmurs, rubs or gallops;  PMI nondisplaced Abdomen -NT/ND, no HSM, no mass, + bowel sounds, no bruit 2+ femoral pulses, no bruits Ext-no edema, chords, 2+ DP Neuro-grossly nonfocal  ECG 04/23/13 sinus with ventricular bigeminy.

## 2013-06-01 NOTE — Assessment & Plan Note (Signed)
Patient is noted to have ventricular bigeminy on her electrocardiogram. I have explained that PVCs in the setting of normal LV function all benign. We will plan echocardiogram to assess LV function. Note potassium normal. She is not symptomatic.

## 2013-06-18 DIAGNOSIS — H02839 Dermatochalasis of unspecified eye, unspecified eyelid: Secondary | ICD-10-CM | POA: Diagnosis not present

## 2013-06-18 DIAGNOSIS — L908 Other atrophic disorders of skin: Secondary | ICD-10-CM | POA: Diagnosis not present

## 2013-06-18 DIAGNOSIS — H11439 Conjunctival hyperemia, unspecified eye: Secondary | ICD-10-CM | POA: Diagnosis not present

## 2013-06-18 DIAGNOSIS — L918 Other hypertrophic disorders of the skin: Secondary | ICD-10-CM | POA: Diagnosis not present

## 2013-06-18 DIAGNOSIS — H02419 Mechanical ptosis of unspecified eyelid: Secondary | ICD-10-CM | POA: Diagnosis not present

## 2013-06-21 DIAGNOSIS — L570 Actinic keratosis: Secondary | ICD-10-CM | POA: Diagnosis not present

## 2013-06-21 DIAGNOSIS — D235 Other benign neoplasm of skin of trunk: Secondary | ICD-10-CM | POA: Diagnosis not present

## 2013-06-22 ENCOUNTER — Ambulatory Visit (HOSPITAL_COMMUNITY): Payer: Medicare Other | Attending: Internal Medicine | Admitting: Radiology

## 2013-06-22 DIAGNOSIS — R9431 Abnormal electrocardiogram [ECG] [EKG]: Secondary | ICD-10-CM | POA: Diagnosis not present

## 2013-06-22 DIAGNOSIS — Z87891 Personal history of nicotine dependence: Secondary | ICD-10-CM | POA: Diagnosis not present

## 2013-06-22 DIAGNOSIS — E785 Hyperlipidemia, unspecified: Secondary | ICD-10-CM | POA: Diagnosis not present

## 2013-06-22 DIAGNOSIS — I4949 Other premature depolarization: Secondary | ICD-10-CM | POA: Diagnosis not present

## 2013-06-22 DIAGNOSIS — I493 Ventricular premature depolarization: Secondary | ICD-10-CM

## 2013-06-22 DIAGNOSIS — I059 Rheumatic mitral valve disease, unspecified: Secondary | ICD-10-CM | POA: Insufficient documentation

## 2013-06-22 DIAGNOSIS — E669 Obesity, unspecified: Secondary | ICD-10-CM | POA: Insufficient documentation

## 2013-06-22 NOTE — Progress Notes (Signed)
Echocardiogram performed.  

## 2013-06-26 ENCOUNTER — Other Ambulatory Visit (INDEPENDENT_AMBULATORY_CARE_PROVIDER_SITE_OTHER): Payer: Medicare Other

## 2013-06-26 DIAGNOSIS — R7989 Other specified abnormal findings of blood chemistry: Secondary | ICD-10-CM

## 2013-06-26 DIAGNOSIS — R945 Abnormal results of liver function studies: Secondary | ICD-10-CM

## 2013-06-26 LAB — HEPATIC FUNCTION PANEL
ALBUMIN: 4 g/dL (ref 3.5–5.2)
ALK PHOS: 51 U/L (ref 39–117)
ALT: 39 U/L — ABNORMAL HIGH (ref 0–35)
AST: 25 U/L (ref 0–37)
Bilirubin, Direct: 0 mg/dL (ref 0.0–0.3)
TOTAL PROTEIN: 7.4 g/dL (ref 6.0–8.3)
Total Bilirubin: 0.6 mg/dL (ref 0.3–1.2)

## 2013-06-27 ENCOUNTER — Encounter: Payer: Self-pay | Admitting: Internal Medicine

## 2013-06-27 ENCOUNTER — Ambulatory Visit (INDEPENDENT_AMBULATORY_CARE_PROVIDER_SITE_OTHER): Payer: Medicare Other | Admitting: Internal Medicine

## 2013-06-27 VITALS — BP 154/82 | HR 58 | Temp 97.2°F | Ht 63.5 in | Wt 183.0 lb

## 2013-06-27 DIAGNOSIS — R7989 Other specified abnormal findings of blood chemistry: Secondary | ICD-10-CM | POA: Diagnosis not present

## 2013-06-27 DIAGNOSIS — R945 Abnormal results of liver function studies: Secondary | ICD-10-CM

## 2013-06-27 DIAGNOSIS — J019 Acute sinusitis, unspecified: Secondary | ICD-10-CM

## 2013-06-27 LAB — HEPATITIS C ANTIBODY: HCV Ab: NEGATIVE

## 2013-06-27 LAB — HEPATITIS B SURFACE ANTIBODY, QUANTITATIVE: Hepatitis B-Post: 0 m[IU]/mL

## 2013-06-27 MED ORDER — HYDROCODONE-HOMATROPINE 5-1.5 MG/5ML PO SYRP
5.0000 mL | ORAL_SOLUTION | ORAL | Status: DC | PRN
Start: 2013-06-27 — End: 2014-09-18

## 2013-06-27 MED ORDER — AMOXICILLIN 875 MG PO TABS: 875.0000 mg | ORAL_TABLET | Freq: Three times a day (TID) | ORAL | Status: DC

## 2013-06-27 NOTE — Patient Instructions (Signed)
Antibiotic may help sinus infection after 10 - 14 days  Saline washes  Nose spray compresses  Cough could last 3+ weeks chest is clear today.  Liver panel is barely abnormal and will follow at next check up.

## 2013-06-27 NOTE — Progress Notes (Signed)
Pre visit review using our clinic review tool, if applicable. No additional management support is needed unless otherwise documented below in the visit note.   Chief Complaint  Patient presents with  . Cough    Started Feb 2nd.  Says she feels like her ears are plugged up.  Cough is productive of a green sputum.  Nasal drainage is also green.  . Nasal Congestion    HPI: Patient comes in today for SDA for  new problem evaluation. Onset 9 days of above ST then cough congestion and ear fullness  Getting worse instead of better with sinsu pressure and colored nasal drainage husband better from illness.  No cp sob wheeze. otc meds.  Needs lab values Had cards check felt to be benign bigeminy. No  intervention ROS: See pertinent positives and negatives per HPI. No fever chills some achy  Past Medical History  Diagnosis Date  . Colon polyps   . GERD (gastroesophageal reflux disease)     had endo? taking nexium 3 x per week  . Diverticulosis   . Hyperlipidemia     had been on lipitor changes to pravachol cause of cost and tricor has since stopped  NO  se reported   . Anemia   . Impaired fasting glucose   . Hypothyroidism      on meds for years  . Asthma     Family History  Problem Relation Age of Onset  . Stroke Mother   . Diabetes Father   . Alzheimer's disease    . Stomach cancer Maternal Aunt   . Colon cancer Neg Hx   . Pancreatic cancer Neg Hx   . Heart disease Father     History   Social History  . Marital Status: Married    Spouse Name: N/A    Number of Children: 3  . Years of Education: N/A   Social History Main Topics  . Smoking status: Former Research scientist (life sciences)  . Smokeless tobacco: Never Used  . Alcohol Use: Yes     Comment: socially  . Drug Use: No  . Sexual Activity: None   Other Topics Concern  . 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    Gravida 3 para 3   Last td 6 2008   HS educated       REmote hx of tobacco 1.5 ppd for 6 years as a teen Q 31    Outpatient Encounter Prescriptions as of 06/27/2013  Medication Sig  . fluticasone (FLONASE) 50 MCG/ACT nasal spray Place 2 sprays into the nose daily.  . Fluticasone-Salmeterol (ADVAIR) 250-50 MCG/DOSE AEPB Inhale 1 puff into the lungs every 12 (twelve) hours.  Marland Kitchen latanoprost (XALATAN) 0.005 % ophthalmic solution 1 drop at bedtime.  Marland Kitchen levothyroxine (SYNTHROID, LEVOTHROID) 50 MCG tablet TAKE 1 TABLET DAILY  . montelukast (SINGULAIR) 10 MG tablet TAKE 1 TABLET AT BEDTIME  . tolterodine (DETROL LA) 2 MG 24 hr capsule Take 1 capsule (2 mg total) by mouth daily.  Marland Kitchen amoxicillin (AMOXIL) 875 MG tablet Take 1 tablet (875 mg total) by mouth 3 (three) times daily. For sinusitis  . HYDROcodone-homatropine (HYCODAN) 5-1.5 MG/5ML syrup Take 5 mLs by mouth every 4 (four) hours as needed for cough.  Marland Kitchen MOVIPREP 100  G SOLR Take 1 kit (200 g total) by mouth once.    EXAM:  BP 154/82  Pulse 58  Temp(Src) 97.2 F (36.2 C) (Oral)  Ht 5' 3.5" (1.613 m)  Wt 183 lb (83.008 kg)  BMI 31.90 kg/m2  SpO2 98%  Body mass index is 31.9 kg/(m^2). WDWN in NAD  quiet respirations; congested  somewhat hoarse. Non toxic . HEENT: Normocephalic ;atraumatic , Eyes;  PERRL, EOMs  Full, lids and conjunctiva clear,,Ears: no deformities, canals nl, TM landmarks normal, Nose: no deformity  Yellow  but congested;face maxillary  tender Mouth : OP clear without lesion or edema . Neck: Supple without adenopathy or masses or bruits Chest:  Clear to A&P without wheezes rales or rhonchi CV:  S1-S2 no gallops or murmurs peripheral perfusion is normal Skin :nl perfusion and no acute rashes  PSYCH: pleasant and cooperative, no obvious depression or anxiety  ASSESSMENT AND PLAN:  Discussed the following assessment and plan:  Sinusitis, acute  Abnormal LFTs - barely hep c  neg follow ( add on hep b agnot done) reveiwed labs  Hep c neg hep aby neg hep ag not done no change  -Patient advised to return or notify health care team  if symptoms worsen or persist or new concerns arise. Pt request printed rx cause of pharmacy delays with EL rx   Patient Instructions  Antibiotic may help sinus infection after 10 - 14 days  Saline washes  Nose spray compresses  Cough could last 3+ weeks chest is clear today.  Liver panel is barely abnormal and will follow at next check up.   Standley Brooking. Panosh M.D.

## 2013-08-20 ENCOUNTER — Ambulatory Visit: Payer: Medicare Other | Admitting: Internal Medicine

## 2013-09-26 DIAGNOSIS — H02419 Mechanical ptosis of unspecified eyelid: Secondary | ICD-10-CM | POA: Diagnosis not present

## 2013-09-26 DIAGNOSIS — H53459 Other localized visual field defect, unspecified eye: Secondary | ICD-10-CM | POA: Diagnosis not present

## 2013-09-26 DIAGNOSIS — H02839 Dermatochalasis of unspecified eye, unspecified eyelid: Secondary | ICD-10-CM | POA: Diagnosis not present

## 2013-09-26 DIAGNOSIS — H02409 Unspecified ptosis of unspecified eyelid: Secondary | ICD-10-CM | POA: Diagnosis not present

## 2013-09-26 DIAGNOSIS — H02829 Cysts of unspecified eye, unspecified eyelid: Secondary | ICD-10-CM | POA: Diagnosis not present

## 2013-10-18 DIAGNOSIS — L905 Scar conditions and fibrosis of skin: Secondary | ICD-10-CM | POA: Diagnosis not present

## 2013-10-25 ENCOUNTER — Other Ambulatory Visit: Payer: Self-pay | Admitting: Physician Assistant

## 2013-10-25 ENCOUNTER — Telehealth: Payer: Self-pay | Admitting: Internal Medicine

## 2013-10-25 ENCOUNTER — Encounter: Payer: Self-pay | Admitting: Physician Assistant

## 2013-10-25 DIAGNOSIS — S59909A Unspecified injury of unspecified elbow, initial encounter: Secondary | ICD-10-CM | POA: Diagnosis not present

## 2013-10-25 DIAGNOSIS — S59919A Unspecified injury of unspecified forearm, initial encounter: Secondary | ICD-10-CM | POA: Diagnosis not present

## 2013-10-25 DIAGNOSIS — S52023A Displaced fracture of olecranon process without intraarticular extension of unspecified ulna, initial encounter for closed fracture: Secondary | ICD-10-CM

## 2013-10-25 DIAGNOSIS — J45909 Unspecified asthma, uncomplicated: Secondary | ICD-10-CM | POA: Insufficient documentation

## 2013-10-25 DIAGNOSIS — D649 Anemia, unspecified: Secondary | ICD-10-CM

## 2013-10-25 DIAGNOSIS — K5790 Diverticulosis of intestine, part unspecified, without perforation or abscess without bleeding: Secondary | ICD-10-CM

## 2013-10-25 DIAGNOSIS — K635 Polyp of colon: Secondary | ICD-10-CM

## 2013-10-25 DIAGNOSIS — IMO0002 Reserved for concepts with insufficient information to code with codable children: Secondary | ICD-10-CM | POA: Diagnosis not present

## 2013-10-25 DIAGNOSIS — R008 Other abnormalities of heart beat: Secondary | ICD-10-CM | POA: Insufficient documentation

## 2013-10-25 HISTORY — DX: Displaced fracture of olecranon process without intraarticular extension of unspecified ulna, initial encounter for closed fracture: S52.023A

## 2013-10-25 NOTE — Telephone Encounter (Signed)
Noted  

## 2013-10-25 NOTE — H&P (Signed)
Marie Kelly is an 67 y.o. female.   Chief Complaint: right elbow pain HPI: 67 yowf fell down the stairs at her son's house today.  Right elbow pain with abrasion. Right ankle abrasions.  Seen at urgent care.  Noted to have comminuted olecranon fracture on xray.  Wounds dressed.  Arm splinted.  To OR tomorrow.  History of Cardiac Bigeminy that has been extensively worked up by Dr Brian Crenshaw at [facility].  Had eyelid blef surgery 1 month ago without complication.  Past Medical History  Diagnosis Date  . Colon polyps   . GERD (gastroesophageal reflux disease)     had endo? taking nexium 3 x per week  . Diverticulosis   . Hyperlipidemia     had been on lipitor changes to pravachol cause of cost and tricor has since stopped  NO  se reported   . Anemia   . Impaired fasting glucose   . Hypothyroidism      on meds for years  . Asthma   . Olecranon fracture 10/25/2013    right arm  . Ventricular bigeminy seen on cardiac monitor     Cardiac work up Dr Crenshaw [facility]    Past Surgical History  Procedure Laterality Date  . Tubal ligation    . Cholecystectomy    . Uterus repair    . Hemorroidectomy    . Tonsillectomy    . Belpharoptosis repair Bilateral     Family History  Problem Relation Age of Onset  . Stroke Mother   . Diabetes Father   . Alzheimer's disease    . Stomach cancer Maternal Aunt   . Colon cancer Neg Hx   . Pancreatic cancer Neg Hx   . Heart disease Father    Social History:  reports that she quit smoking about 40 years ago. She has never used smokeless tobacco. She reports that she drinks alcohol. She reports that she does not use illicit drugs.  Allergies:  Allergies  Allergen Reactions  . Hydrochlorothiazide     Muscles tightness and felt like she was hit by a truck   Current Outpatient Prescriptions on File Prior to Visit  Medication Sig Dispense Refill  . fluticasone (FLONASE) 50 MCG/ACT nasal spray Place 2 sprays into the nose daily.       . Fluticasone-Salmeterol (ADVAIR) 250-50 MCG/DOSE AEPB Inhale 1 puff into the lungs every 12 (twelve) hours.      . latanoprost (XALATAN) 0.005 % ophthalmic solution 1 drop at bedtime.      . levothyroxine (SYNTHROID, LEVOTHROID) 50 MCG tablet TAKE 1 TABLET DAILY  90 tablet  2  . montelukast (SINGULAIR) 10 MG tablet TAKE 1 TABLET AT BEDTIME  90 tablet  2  . tolterodine (DETROL LA) 2 MG 24 hr capsule Take 1 capsule (2 mg total) by mouth daily.  90 capsule  3  . amoxicillin (AMOXIL) 875 MG tablet Take 1 tablet (875 mg total) by mouth 3 (three) times daily. For sinusitis  21 tablet  0  . HYDROcodone-homatropine (HYCODAN) 5-1.5 MG/5ML syrup Take 5 mLs by mouth every 4 (four) hours as needed for cough.  180 mL  0  . MOVIPREP 100 G SOLR Take 1 kit (200 g total) by mouth once.  1 kit  0  . [DISCONTINUED] tolterodine (DETROL LA) 2 MG 24 hr capsule Take 2 mg by mouth daily.       No current facility-administered medications on file prior to visit.     (Not   in a hospital admission)  No results found for this or any previous visit (from the past 48 hour(s)). No results found.  Review of Systems  Constitutional: Negative.   Eyes: Negative.   Respiratory: Negative.   Cardiovascular: Negative.        History of cardiac bigeminy  Gastrointestinal: Negative.   Musculoskeletal: Positive for joint pain.       Right elbow pain  Skin: Negative.   Neurological: Negative.   Endo/Heme/Allergies: Negative.   Psychiatric/Behavioral: Negative.     Blood pressure 132/54, height 5' 5" (1.651 m), weight 81.194 kg (179 lb). Physical Exam  Constitutional: She is oriented to person, place, and time. She appears well-developed and well-nourished.  HENT:  Head: Normocephalic and atraumatic.  Eyes: Conjunctivae are normal. Pupils are equal, round, and reactive to light.  Neck: Neck supple.  Cardiovascular: Normal rate.   irregular rhythm   Respiratory: Effort normal.  GI: Soft.  Genitourinary:  Not  pertinent to current symptomatology therefore not examined.  Musculoskeletal:  Right elbow in long arm splint  Neurovascularly intact  Neurological: She is alert and oriented to person, place, and time.  Skin: Skin is warm.  Psychiatric: She has a normal mood and affect.     Assessment Patient Active Problem List   Diagnosis Date Noted  . Olecranon fracture 10/25/2013  . Asthma   . Anemia   . Diverticulosis   . Colon polyps   . Ventricular bigeminy seen on cardiac monitor   . PVC's (premature ventricular contractions) 06/01/2013  . Irregular heart rate 04/25/2013  . Arthritis of right knee 04/25/2013  . Osteoporosis hx ?of on fosamax  03/11/2012  . Elevated blood pressure reading 09/04/2011  . Obesity 07/04/2011  . Early stage glaucoma 07/04/2011  . History of osteopenia 06/30/2011  . Asthma 06/29/2011  . Hormone replacement therapy (postmenopausal) 06/29/2011  . Lumpy breasts 06/29/2011  . Urinary urgency 06/29/2011  . Hypothyroidism   . Impaired fasting glucose   . Hyperlipidemia   . GERD (gastroesophageal reflux disease)     Plan Open reduction internal fixation of right elbow.  The risks, benefits, and possible complications of the procedure were discussed in detail with the patient.  The patient is without question.  Tarun Patchell J 10/25/2013, 8:33 PM    

## 2013-10-25 NOTE — Telephone Encounter (Signed)
Patient Information:  Caller Name: Delayne  Phone: [phone]  Patient: Marie Kelly, Marie Kelly  Gender: Female  DOB: 1945/11/07  Age: 68 Years  PCP: Shanon Ace [facility])  Office Follow Up:  Does the office need to follow up with this patient?: No  Instructions For The Office: N/A  RN Note:  patient calling regarding recent fall.  States "I fell on my sons steps after tripping over the dogs, and I want to get an xray today.  Can I get that at the office?"  Per Seth Bake there is no xray available at office.  Offered to triage, patient declines stating "I'm going to [facility] on [facility] for xray now."  Symptoms  Reason For Call & Symptoms: elbow injury wants to know if office can do X-rays  Reviewed Health History In EMR: Yes  Reviewed Medications In EMR: Yes  Reviewed Allergies In EMR: Yes  Reviewed Surgeries / Procedures: Yes  Date of Onset of Symptoms: 10/25/2013  Guideline(s) Used:  No Protocol Available - Information Only  Disposition Per Guideline:   Home Care  Reason For Disposition Reached:   Information only question and nurse able to answer  Advice Given:  Call Back If:  New symptoms develop  Patient Will Follow Care Advice:  YES

## 2013-10-26 ENCOUNTER — Ambulatory Visit (HOSPITAL_BASED_OUTPATIENT_CLINIC_OR_DEPARTMENT_OTHER)
Admission: RE | Admit: 2013-10-26 | Discharge: 2013-10-26 | Disposition: A | Discharge status: Home / Self Care | Payer: Medicare Other | Source: Ambulatory Visit | Attending: Orthopedic Surgery | Admitting: Orthopedic Surgery

## 2013-10-26 ENCOUNTER — Encounter (HOSPITAL_BASED_OUTPATIENT_CLINIC_OR_DEPARTMENT_OTHER)
Admission: RE | Disposition: A | Discharge status: Home / Self Care | Payer: Self-pay | Source: Ambulatory Visit | Attending: Orthopedic Surgery

## 2013-10-26 ENCOUNTER — Encounter (HOSPITAL_BASED_OUTPATIENT_CLINIC_OR_DEPARTMENT_OTHER): Payer: Medicare Other | Admitting: Anesthesiology

## 2013-10-26 ENCOUNTER — Ambulatory Visit (HOSPITAL_BASED_OUTPATIENT_CLINIC_OR_DEPARTMENT_OTHER): Payer: Medicare Other | Admitting: Anesthesiology

## 2013-10-26 ENCOUNTER — Encounter (HOSPITAL_BASED_OUTPATIENT_CLINIC_OR_DEPARTMENT_OTHER): Payer: Self-pay

## 2013-10-26 DIAGNOSIS — Z888 Allergy status to other drugs, medicaments and biological substances status: Secondary | ICD-10-CM | POA: Insufficient documentation

## 2013-10-26 DIAGNOSIS — E669 Obesity, unspecified: Secondary | ICD-10-CM | POA: Diagnosis not present

## 2013-10-26 DIAGNOSIS — J45909 Unspecified asthma, uncomplicated: Secondary | ICD-10-CM | POA: Insufficient documentation

## 2013-10-26 DIAGNOSIS — M899 Disorder of bone, unspecified: Secondary | ICD-10-CM | POA: Insufficient documentation

## 2013-10-26 DIAGNOSIS — Z79899 Other long term (current) drug therapy: Secondary | ICD-10-CM | POA: Insufficient documentation

## 2013-10-26 DIAGNOSIS — K5710 Diverticulosis of small intestine without perforation or abscess without bleeding: Secondary | ICD-10-CM | POA: Insufficient documentation

## 2013-10-26 DIAGNOSIS — Y9289 Other specified places as the place of occurrence of the external cause: Secondary | ICD-10-CM | POA: Insufficient documentation

## 2013-10-26 DIAGNOSIS — K219 Gastro-esophageal reflux disease without esophagitis: Secondary | ICD-10-CM | POA: Diagnosis not present

## 2013-10-26 DIAGNOSIS — S52021A Displaced fracture of olecranon process without intraarticular extension of right ulna, initial encounter for closed fracture: Secondary | ICD-10-CM | POA: Diagnosis present

## 2013-10-26 DIAGNOSIS — D649 Anemia, unspecified: Secondary | ICD-10-CM | POA: Insufficient documentation

## 2013-10-26 DIAGNOSIS — W108XXA Fall (on) (from) other stairs and steps, initial encounter: Secondary | ICD-10-CM | POA: Insufficient documentation

## 2013-10-26 DIAGNOSIS — M25529 Pain in unspecified elbow: Secondary | ICD-10-CM | POA: Diagnosis not present

## 2013-10-26 DIAGNOSIS — Z6830 Body mass index (BMI) 30.0-30.9, adult: Secondary | ICD-10-CM | POA: Diagnosis not present

## 2013-10-26 DIAGNOSIS — S52023A Displaced fracture of olecranon process without intraarticular extension of unspecified ulna, initial encounter for closed fracture: Secondary | ICD-10-CM | POA: Diagnosis not present

## 2013-10-26 DIAGNOSIS — E039 Hypothyroidism, unspecified: Secondary | ICD-10-CM | POA: Diagnosis not present

## 2013-10-26 DIAGNOSIS — M949 Disorder of cartilage, unspecified: Secondary | ICD-10-CM | POA: Diagnosis not present

## 2013-10-26 DIAGNOSIS — G8918 Other acute postprocedural pain: Secondary | ICD-10-CM | POA: Diagnosis not present

## 2013-10-26 DIAGNOSIS — E785 Hyperlipidemia, unspecified: Secondary | ICD-10-CM | POA: Diagnosis not present

## 2013-10-26 HISTORY — DX: Displaced fracture of olecranon process without intraarticular extension of right ulna, initial encounter for closed fracture: S52.021A

## 2013-10-26 HISTORY — PX: ORIF ELBOW FRACTURE: SHX5031

## 2013-10-26 LAB — POCT HEMOGLOBIN-HEMACUE: Hemoglobin: 13.4 g/dL (ref 12.0–15.0)

## 2013-10-26 SURGERY — OPEN REDUCTION INTERNAL FIXATION (ORIF) ELBOW/OLECRANON FRACTURE
Anesthesia: Regional | Site: Elbow | Laterality: Right

## 2013-10-26 MED ORDER — ROPIVACAINE HCL 5 MG/ML IJ SOLN
INTRAMUSCULAR | Status: DC | PRN
  Administered 2013-10-26: 40 mL via PERINEURAL

## 2013-10-26 MED ORDER — SUCCINYLCHOLINE CHLORIDE 20 MG/ML IJ SOLN
INTRAMUSCULAR | Status: DC | PRN
  Administered 2013-10-26: 100 mg via INTRAVENOUS

## 2013-10-26 MED ORDER — CHLORHEXIDINE GLUCONATE 4 % EX LIQD: 60.0000 mL | Freq: Once | CUTANEOUS | Status: DC

## 2013-10-26 MED ORDER — EPHEDRINE SULFATE 50 MG/ML IJ SOLN
INTRAMUSCULAR | Status: DC | PRN
  Administered 2013-10-26: 10 mg via INTRAVENOUS

## 2013-10-26 MED ORDER — CEFAZOLIN SODIUM-DEXTROSE 2-3 GM-% IV SOLR
INTRAVENOUS | Status: AC
  Filled 2013-10-26: qty 50

## 2013-10-26 MED ORDER — DEXAMETHASONE SODIUM PHOSPHATE 4 MG/ML IJ SOLN
INTRAMUSCULAR | Status: DC | PRN
  Administered 2013-10-26: 10 mg via INTRAVENOUS

## 2013-10-26 MED ORDER — PROPOFOL 10 MG/ML IV BOLUS
INTRAVENOUS | Status: DC | PRN
  Administered 2013-10-26: 150 mg via INTRAVENOUS

## 2013-10-26 MED ORDER — LIDOCAINE HCL 1 % IJ SOLN
INTRAMUSCULAR | Status: DC | PRN
  Administered 2013-10-26: 2 mL via INTRADERMAL

## 2013-10-26 MED ORDER — MIDAZOLAM HCL 2 MG/2ML IJ SOLN
INTRAMUSCULAR | Status: AC
  Filled 2013-10-26: qty 2

## 2013-10-26 MED ORDER — LIDOCAINE HCL (CARDIAC) 20 MG/ML IV SOLN
INTRAVENOUS | Status: DC | PRN
  Administered 2013-10-26: 40 mg via INTRAVENOUS

## 2013-10-26 MED ORDER — OXYCODONE HCL 5 MG PO TABS: 5.0000 mg | ORAL_TABLET | Freq: Four times a day (QID) | ORAL | Status: DC | PRN

## 2013-10-26 MED ORDER — FENTANYL CITRATE 0.05 MG/ML IJ SOLN
INTRAMUSCULAR | Status: AC
  Filled 2013-10-26: qty 2

## 2013-10-26 MED ORDER — SENNA-DOCUSATE SODIUM 8.6-50 MG PO TABS: 2.0000 | ORAL_TABLET | Freq: Every day | ORAL | Status: DC

## 2013-10-26 MED ORDER — FENTANYL CITRATE 0.05 MG/ML IJ SOLN
INTRAMUSCULAR | Status: AC
  Filled 2013-10-26: qty 6

## 2013-10-26 MED ORDER — FENTANYL CITRATE 0.05 MG/ML IJ SOLN
50.0000 ug | INTRAMUSCULAR | Status: DC | PRN
  Administered 2013-10-26: 100 ug via INTRAVENOUS

## 2013-10-26 MED ORDER — LACTATED RINGERS IV SOLN
INTRAVENOUS | Status: DC
  Administered 2013-10-26 (×4): via INTRAVENOUS

## 2013-10-26 MED ORDER — CEFAZOLIN SODIUM-DEXTROSE 2-3 GM-% IV SOLR
2.0000 g | INTRAVENOUS | Status: AC
  Administered 2013-10-26: 2 g via INTRAVENOUS

## 2013-10-26 MED ORDER — ONDANSETRON HCL 4 MG/2ML IJ SOLN
INTRAMUSCULAR | Status: DC | PRN
Start: 2013-10-26 — End: 2013-10-26
  Administered 2013-10-26: 4 mg via INTRAVENOUS

## 2013-10-26 MED ORDER — MIDAZOLAM HCL 2 MG/2ML IJ SOLN
1.0000 mg | INTRAMUSCULAR | Status: DC | PRN
  Administered 2013-10-26: 2 mg via INTRAVENOUS

## 2013-10-26 MED ORDER — METOCLOPRAMIDE HCL 5 MG/ML IJ SOLN
INTRAMUSCULAR | Status: DC | PRN
  Administered 2013-10-26: 10 mg via INTRAVENOUS

## 2013-10-26 MED ORDER — ONDANSETRON HCL 4 MG PO TABS: 4.0000 mg | ORAL_TABLET | Freq: Three times a day (TID) | ORAL | Status: DC | PRN

## 2013-10-26 SURGICAL SUPPLY — 81 items
BANDAGE ELASTIC 3 VELCRO ST LF (GAUZE/BANDAGES/DRESSINGS) IMPLANT
BANDAGE ELASTIC 4 VELCRO ST LF (GAUZE/BANDAGES/DRESSINGS) ×2 IMPLANT
BENZOIN TINCTURE PRP APPL 2/3 (GAUZE/BANDAGES/DRESSINGS) ×2 IMPLANT
BIT DRILL 2.0 LNG QUCK RELEASE (BIT) ×1 IMPLANT
BIT DRILL 2.8 QUICK RELEASE (BIT) ×1 IMPLANT
BLADE AVERAGE 25X9 (BLADE) IMPLANT
BLADE MINI RND TIP GREEN BEAV (BLADE) IMPLANT
BLADE SURG 15 STRL LF DISP TIS (BLADE) ×1 IMPLANT
BLADE SURG 15 STRL SS (BLADE) ×1
BNDG COHESIVE 4X5 TAN STRL (GAUZE/BANDAGES/DRESSINGS) IMPLANT
BNDG ELASTIC 2 VLCR STRL LF (GAUZE/BANDAGES/DRESSINGS) IMPLANT
BNDG ESMARK 4X9 LF (GAUZE/BANDAGES/DRESSINGS) ×2 IMPLANT
CANISTER SUCT 1200ML W/VALVE (MISCELLANEOUS) ×2 IMPLANT
COVER TABLE BACK 60X90 (DRAPES) ×2 IMPLANT
CUFF TOURNIQUET SINGLE 18IN (TOURNIQUET CUFF) IMPLANT
DECANTER SPIKE VIAL GLASS SM (MISCELLANEOUS) IMPLANT
DRAPE EXTREMITY T 121X128X90 (DRAPE) ×2 IMPLANT
DRAPE OEC MINIVIEW 54X84 (DRAPES) ×2 IMPLANT
DRAPE U 20/CS (DRAPES) ×2 IMPLANT
DRAPE U-SHAPE 47X51 STRL (DRAPES) ×2 IMPLANT
DRILL 2.0 LNG QUICK RELEASE (BIT) ×2
DRILL 2.8 QUICK RELEASE (BIT) ×2
DURAPREP 26ML APPLICATOR (WOUND CARE) ×2 IMPLANT
ELECT REM PT RETURN 9FT ADLT (ELECTROSURGICAL) ×2
ELECTRODE REM PT RTRN 9FT ADLT (ELECTROSURGICAL) ×1 IMPLANT
GAUZE SPONGE 4X4 12PLY STRL (GAUZE/BANDAGES/DRESSINGS) ×2 IMPLANT
GAUZE XEROFORM 1X8 LF (GAUZE/BANDAGES/DRESSINGS) IMPLANT
GLOVE BIO SURGEON STRL SZ 6.5 (GLOVE) ×2 IMPLANT
GLOVE BIO SURGEON STRL SZ7 (GLOVE) ×2 IMPLANT
GLOVE BIO SURGEON STRL SZ8 (GLOVE) ×2 IMPLANT
GLOVE BIOGEL PI IND STRL 7.0 (GLOVE) ×1 IMPLANT
GLOVE BIOGEL PI IND STRL 8 (GLOVE) ×2 IMPLANT
GLOVE BIOGEL PI INDICATOR 7.0 (GLOVE) ×1
GLOVE BIOGEL PI INDICATOR 8 (GLOVE) ×2
GLOVE ORTHO TXT STRL SZ7.5 (GLOVE) ×2 IMPLANT
GOWN STRL REUS W/ TWL LRG LVL3 (GOWN DISPOSABLE) ×1 IMPLANT
GOWN STRL REUS W/ TWL XL LVL3 (GOWN DISPOSABLE) ×2 IMPLANT
GOWN STRL REUS W/TWL LRG LVL3 (GOWN DISPOSABLE) ×1
GOWN STRL REUS W/TWL XL LVL3 (GOWN DISPOSABLE) ×2
GUIDEWIRE ORTHO 2.0X9 ST (WIRE) ×2 IMPLANT
NEEDLE HYPO 25X1 1.5 SAFETY (NEEDLE) IMPLANT
NS IRRIG 1000ML POUR BTL (IV SOLUTION) ×2 IMPLANT
PACK BASIN DAY SURGERY FS (CUSTOM PROCEDURE TRAY) ×2 IMPLANT
PAD CAST 3X4 CTTN HI CHSV (CAST SUPPLIES) IMPLANT
PAD CAST 4YDX4 CTTN HI CHSV (CAST SUPPLIES) ×1 IMPLANT
PADDING CAST ABS 4INX4YD NS (CAST SUPPLIES) ×1
PADDING CAST ABS COTTON 4X4 ST (CAST SUPPLIES) ×1 IMPLANT
PADDING CAST COTTON 3X4 STRL (CAST SUPPLIES)
PADDING CAST COTTON 4X4 STRL (CAST SUPPLIES) ×1
PADDING UNDERCAST 2 STRL (CAST SUPPLIES)
PADDING UNDERCAST 2X4 STRL (CAST SUPPLIES) IMPLANT
PENCIL BUTTON HOLSTER BLD 10FT (ELECTRODE) ×2 IMPLANT
PLATE OLECRANON RT .3HOLE (Plate) ×2 IMPLANT
SCREW CORTICAL 3.5X20MM (Screw) ×4 IMPLANT
SCREW HEX LOCK 2.7X16MM (Screw) ×4 IMPLANT
SCREW HEX LOCK 3.5X20MM (Screw) ×2 IMPLANT
SCREW LOCK 18X2.7X HEXALOBE (Screw) ×2 IMPLANT
SCREW LOCKING 2.7X18MM (Screw) ×2 IMPLANT
SCREW LOCKING 3.5X45MM (Screw) ×2 IMPLANT
SLEEVE SCD COMPRESS KNEE MED (MISCELLANEOUS) ×2 IMPLANT
SPLINT FAST PLASTER 5X30 (CAST SUPPLIES)
SPLINT PLASTER CAST FAST 5X30 (CAST SUPPLIES) IMPLANT
SPLINT PLASTER CAST XFAST 4X15 (CAST SUPPLIES) IMPLANT
SPLINT PLASTER XTRA FAST SET 4 (CAST SUPPLIES)
SPONGE LAP 4X18 X RAY DECT (DISPOSABLE) ×2 IMPLANT
STOCKINETTE 4X48 STRL (DRAPES) ×2 IMPLANT
STRIP CLOSURE SKIN 1/2X4 (GAUZE/BANDAGES/DRESSINGS) ×2 IMPLANT
SUCTION FRAZIER TIP 10 FR DISP (SUCTIONS) ×2 IMPLANT
SUT ETHIBOND 0 MO6 C/R (SUTURE) IMPLANT
SUT ETHILON 3 0 PS 1 (SUTURE) IMPLANT
SUT ETHILON 4 0 PS 2 18 (SUTURE) IMPLANT
SUT MNCRL AB 4-0 PS2 18 (SUTURE) ×2 IMPLANT
SUT VIC AB 0 SH 27 (SUTURE) IMPLANT
SUT VIC AB 3-0 SH 27 (SUTURE)
SUT VIC AB 3-0 SH 27X BRD (SUTURE) IMPLANT
SUT VICRYL 3-0 CR8 SH (SUTURE) IMPLANT
SYR BULB 3OZ (MISCELLANEOUS) ×2 IMPLANT
SYR CONTROL 10ML LL (SYRINGE) IMPLANT
TOWEL OR 17X24 6PK STRL BLUE (TOWEL DISPOSABLE) ×2 IMPLANT
TUBE CONNECTING 20X1/4 (TUBING) ×2 IMPLANT
UNDERPAD 30X30 INCONTINENT (UNDERPADS AND DIAPERS) ×2 IMPLANT

## 2013-10-26 NOTE — Anesthesia Procedure Notes (Addendum)
Anesthesia Regional Block:  Axillary brachial plexus block  Pre-Anesthetic Checklist: ,, timeout performed, Correct Patient, Correct Site, Correct Laterality, Correct Procedure, Correct Position, site marked, Risks and benefits discussed,  Surgical consent,  Pre-op evaluation,  At surgeon's request and post-op pain management  Laterality: Right  Prep: chloraprep       Needles:   Needle Type: Other     Needle Length: 9cm 9 cm Needle Gauge: 21 and 21 G    Additional Needles:  Procedures: ultrasound guided (picture in chart) Axillary brachial plexus block Narrative:  Start time: 10/26/2013 1:50 PM End time: 10/26/2013 1:58 PM Injection made incrementally with aspirations every 5 mL.  Performed by: Personally   Additional Notes: Ultrasound guidance used to: id relevant anatomy, confirm needle position, local anesthetic spread, avoidance of vascular puncture. Picture saved. No complications. Block performed personally by Jessy Oto. Albertina Parr, MD    Procedure Name: Intubation Performed by: Tawni Millers Pre-anesthesia Checklist: Patient identified, Emergency Drugs available, Suction available and Patient being monitored Patient Re-evaluated:Patient Re-evaluated prior to inductionOxygen Delivery Method: Circle System Utilized Preoxygenation: Pre-oxygenation with 100% oxygen Intubation Type: IV induction Ventilation: Mask ventilation without difficulty Laryngoscope Size: Mac and 3 Tube type: Oral Tube size: 7.0 mm Number of attempts: 1 Airway Equipment and Method: stylet and oral airway Placement Confirmation: ETT inserted through vocal cords under direct vision,  positive ETCO2 and breath sounds checked- equal and bilateral Tube secured with: Tape Dental Injury: Teeth and Oropharynx as per pre-operative assessment

## 2013-10-26 NOTE — Anesthesia Preprocedure Evaluation (Signed)
Anesthesia Evaluation  Patient identified by MRN, date of birth, ID band Patient awake    Reviewed: Allergy & Precautions, H&P , NPO status , Patient's Chart, lab work & pertinent test results, reviewed documented beta blocker date and time   Airway Mallampati: II TM Distance: >3 FB Neck ROM: full    Dental   Pulmonary asthma , former smoker,  breath sounds clear to auscultation        Cardiovascular negative cardio ROS  Rhythm:regular     Neuro/Psych negative neurological ROS  negative psych ROS   GI/Hepatic Neg liver ROS, GERD-  Medicated and Controlled,  Endo/Other  negative endocrine ROSHypothyroidism   Renal/GU negative Renal ROS  negative genitourinary   Musculoskeletal   Abdominal   Peds  Hematology  (+) anemia ,   Anesthesia Other Findings See surgeon's H&P   Reproductive/Obstetrics negative OB ROS                           Anesthesia Physical Anesthesia Plan  ASA: III  Anesthesia Plan: General   Post-op Pain Management:    Induction: Intravenous  Airway Management Planned: LMA  Additional Equipment:   Intra-op Plan:   Post-operative Plan:   Informed Consent: I have reviewed the patients History and Physical, chart, labs and discussed the procedure including the risks, benefits and alternatives for the proposed anesthesia with the patient or authorized representative who has indicated his/her understanding and acceptance.   Dental Advisory Given  Plan Discussed with: CRNA and Surgeon  Anesthesia Plan Comments:         Anesthesia Quick Evaluation

## 2013-10-26 NOTE — H&P (View-Only) (Signed)
Marie Kelly is an 68 y.o. female.   Chief Complaint: right elbow pain HPI: 39 yowf fell down the stairs at her son's house today.  Right elbow pain with abrasion. Right ankle abrasions.  Seen at urgent care.  Noted to have comminuted olecranon fracture on xray.  Wounds dressed.  Arm splinted.  To OR tomorrow.  History of Cardiac Bigeminy that has been extensively worked up by Dr Kirk Ruths at [facility].  Had eyelid blef surgery 1 month ago without complication.  Past Medical History  Diagnosis Date  . Colon polyps   . GERD (gastroesophageal reflux disease)     had endo? taking nexium 3 x per week  . Diverticulosis   . Hyperlipidemia     had been on lipitor changes to pravachol cause of cost and tricor has since stopped  NO  se reported   . Anemia   . Impaired fasting glucose   . Hypothyroidism      on meds for years  . Asthma   . Olecranon fracture 10/25/2013    right arm  . Ventricular bigeminy seen on cardiac monitor     Cardiac work up Dr Berl[facility]    Past Surgical History  Procedure Laterality Date  . Tubal ligation    . Cholecystectomy    . Uterus repair    . Hemorroidectomy    . Tonsillectomy    . Belpharoptosis repair Bilateral     Family History  Problem Relation Age of Onset  . Stroke Mother   . Diabetes Father   . Alzheimer's disease    . Stomach cancer Maternal Aunt   . Colon cancer Neg Hx   . Pancreatic cancer Neg Hx   . Heart disease Father    Social History:  reports that she quit smoking about 40 years ago. She has never used smokeless tobacco. She reports that she drinks alcohol. She reports that she does not use illicit drugs.  Allergies:  Allergies  Allergen Reactions  . Hydrochlorothiazide     Muscles tightness and felt like she was hit by a truck   Current Outpatient Prescriptions on File Prior to Visit  Medication Sig Dispense Refill  . fluticasone (FLONASE) 50 MCG/ACT nasal spray Place 2 sprays into the nose daily.       . Fluticasone-Salmeterol (ADVAIR) 250-50 MCG/DOSE AEPB Inhale 1 puff into the lungs every 12 (twelve) hours.      Marland Kitchen latanoprost (XALATAN) 0.005 % ophthalmic solution 1 drop at bedtime.      Marland Kitchen levothyroxine (SYNTHROID, LEVOTHROID) 50 MCG tablet TAKE 1 TABLET DAILY  90 tablet  2  . montelukast (SINGULAIR) 10 MG tablet TAKE 1 TABLET AT BEDTIME  90 tablet  2  . tolterodine (DETROL LA) 2 MG 24 hr capsule Take 1 capsule (2 mg total) by mouth daily.  90 capsule  3  . amoxicillin (AMOXIL) 875 MG tablet Take 1 tablet (875 mg total) by mouth 3 (three) times daily. For sinusitis  21 tablet  0  . HYDROcodone-homatropine (HYCODAN) 5-1.5 MG/5ML syrup Take 5 mLs by mouth every 4 (four) hours as needed for cough.  180 mL  0  . MOVIPREP 100 G SOLR Take 1 kit (200 g total) by mouth once.  1 kit  0  . [DISCONTINUED] tolterodine (DETROL LA) 2 MG 24 hr capsule Take 2 mg by mouth daily.       No current facility-administered medications on file prior to visit.     (Not  in a hospital admission)  No results found for this or any previous visit (from the past 48 hour(s)). No results found.  Review of Systems  Constitutional: Negative.   Eyes: Negative.   Respiratory: Negative.   Cardiovascular: Negative.        History of cardiac bigeminy  Gastrointestinal: Negative.   Musculoskeletal: Positive for joint pain.       Right elbow pain  Skin: Negative.   Neurological: Negative.   Endo/Heme/Allergies: Negative.   Psychiatric/Behavioral: Negative.     Blood pressure 132/54, height _0  (1.651 m), weight 81.194 kg (179 lb). Physical Exam  Constitutional: She is oriented to person, place, and time. She appears well-developed and well-nourished.  HENT:  Head: Normocephalic and atraumatic.  Eyes: Conjunctivae are normal. Pupils are equal, round, and reactive to light.  Neck: Neck supple.  Cardiovascular: Normal rate.   irregular rhythm   Respiratory: Effort normal.  GI: Soft.  Genitourinary:  Not  pertinent to current symptomatology therefore not examined.  Musculoskeletal:  Right elbow in long arm splint  Neurovascularly intact  Neurological: She is alert and oriented to person, place, and time.  Skin: Skin is warm.  Psychiatric: She has a normal mood and affect.     Assessment Patient Active Problem List   Diagnosis Date Noted  . Olecranon fracture 10/25/2013  . Asthma   . Anemia   . Diverticulosis   . Colon polyps   . Ventricular bigeminy seen on cardiac monitor   . PVC's (premature ventricular contractions) 06/01/2013  . Irregular heart rate 04/25/2013  . Arthritis of right knee 04/25/2013  . Osteoporosis hx ?of on fosamax  03/11/2012  . Elevated blood pressure reading 09/04/2011  . Obesity 07/04/2011  . Early stage glaucoma 07/04/2011  . History of osteopenia 06/30/2011  . Asthma 06/29/2011  . Hormone replacement therapy (postmenopausal) 06/29/2011  . Lumpy breasts 06/29/2011  . Urinary urgency 06/29/2011  . Hypothyroidism   . Impaired fasting glucose   . Hyperlipidemia   . GERD (gastroesophageal reflux disease)     Plan Open reduction internal fixation of right elbow.  The risks, benefits, and possible complications of the procedure were discussed in detail with the patient.  The patient is without question.  Mert Dietrick J 10/25/2013, 8:33 PM

## 2013-10-26 NOTE — Interval H&P Note (Signed)
History and Physical Interval Note:  10/26/2013 1:47 PM  Marie Kelly  has presented today for surgery, with the diagnosis of RIGHT ELBOW FRACTURE OLECRANON  The various methods of treatment have been discussed with the patient and family. After consideration of risks, benefits and other options for treatment, the patient has consented to  Procedure(s): RIGHT ELBOW FRACTUE OPEN TREATMENT ULNAR PROXIMAL END OLECRANON PROCESS INCLUDES INTERNAL FIXATION WHEN PERFORMED (Right) as a surgical intervention .  The patient's history has been reviewed, patient examined, no change in status, stable for surgery.  I have reviewed the patient's chart and labs.  Questions were answered to the patient's satisfaction.     Avyn Aden P

## 2013-10-26 NOTE — Op Note (Signed)
10/26/2013  4:48 PM  PATIENT:  Marie Kelly    PRE-OPERATIVE DIAGNOSIS:  RIGHT ELBOW FRACTURE OLECRANON  POST-OPERATIVE DIAGNOSIS:  Same  PROCEDURE:  RIGHT ELBOW FRACTUE OPEN TREATMENT ULNAR PROXIMAL END OLECRANON PROCESS INCLUDES INTERNAL FIXATION  SURGEON:  Johnny Bridge, MD  PHYSICIAN ASSISTANT: Joya Gaskins, OPA-C, present and scrubbed throughout the case, critical for completion in a timely fashion, and for retraction, instrumentation, and closure.  ANESTHESIA:   General  PREOPERATIVE INDICATIONS:  Marie Kelly is a  68 y.o. female with a diagnosis of [place] who failed conservative measures and elected for surgical management.  She had substantial comminution and displacement.  The risks benefits and alternatives were discussed with the patient preoperatively including but not limited to the risks of infection, bleeding, nerve injury, cardiopulmonary complications, the need for revision surgery, among others, and the patient was willing to proceed.  OPERATIVE IMPLANTS: Acumed proximal olecranon locking plate with 5 proximal locking screws, one distal locking screw and 2 distal cortical screws  OPERATIVE FINDINGS: Four-part proximal olecranon fracture with displacement and relative osteopenia.  OPERATIVE PROCEDURE: The patient is brought to the operating room and placed in the supine position. General anesthesia was administered. IV antibiotics were given. She was turned into the lateral decubitus position all bony prominences padded with an axillary roll. The right upper [place] was prepped and draped in usual sterile fashion. Time out was performed. The arm was elevated and exsanguinated and a tourniquet was inflated. A posterior approach to the olecranon was carried out. The multiple pieces of the olecranon fracture identified, cleaned, reduced anatomically, and held with a clamp. I applied the plate, secured it distally with a nonlocking screw through  the sliding hole. I confirm satisfactory position the plate on the lateral view using the C-arm. I did make an incision through the triceps slightly in order to get the plate to sit down as flush as possible posteriorly.  I then secured the plate proximally with a total of 4 locking screws, after securing the olecranon piece using a long K wire bicortically through the locking guidewire jig.  I then placed a bicortical screw through the plate at the homerun position.  I secured the plate distally with 2 more screws, had excellent fixation, the olecranon and as a single unit, and took live fluoroscopy to confirm appropriate length of all of the screws. I irrigated the wounds copiously, repaired the fascia overlying the plate with 0 Vicryl, followed by 30 for subcutaneous tissue with Steri-Strips and sterile gauze for the skin. She had a regional block. She was awakened and returned to the PACU in stable and satisfactory condition. There were no complications and she tolerated the procedure well. She was placed in a well-padded posterior splint prior to awakening.

## 2013-10-26 NOTE — Discharge Instructions (Signed)
Diet: As you were doing prior to hospitalization   Shower:  May shower but keep the wounds dry, use an occlusive plastic wrap, NO SOAKING IN TUB.  If the bandage gets wet, change with a clean dry gauze.  Dressing:  Keep splint dry.  There are sticky tapes (steri-strips) on your wounds and all the stitches are absorbable.  Leave the steri-strips in place, they will peel off with time, usually 2-3 weeks.  Activity:  Increase activity slowly as tolerated, but follow the weight bearing instructions below.  No lifting or driving for 6 weeks.  Weight Bearing:   No lifting with right arm..    To prevent constipation: you may use a stool softener such as -  Colace (over the counter) 100 mg by mouth twice a day  Drink plenty of fluids (prune juice may be helpful) and high fiber foods Miralax (over the counter) for constipation as needed.    Itching:  If you experience itching with your medications, try taking only a single pain pill, or even half a pain pill at a time.  You may take up to 10 pain pills per day, and you can also use benadryl over the counter for itching or also to help with sleep.   Precautions:  If you experience chest pain or shortness of breath - call 911 immediately for transfer to the hospital emergency department!!  If you develop a fever greater that 101 F, purulent drainage from wound, increased redness or drainage from wound, or calf pain -- Call the office at [phone]                                                Follow- Up Appointment:  Please call for an appointment to be seen in 2 weeks [place] - [phone]      Post Anesthesia Home Care Instructions  Activity: Get plenty of rest for the remainder of the day. A responsible adult should stay with you for 24 hours following the procedure.  For the next 24 hours, DO NOT: -Drive a car -Paediatric nurse -Drink alcoholic beverages -Take any medication unless instructed by your physician -Make any legal  decisions or sign important papers.  Meals: Start with liquid foods such as gelatin or soup. Progress to regular foods as tolerated. Avoid greasy, spicy, heavy foods. If nausea and/or vomiting occur, drink only clear liquids until the nausea and/or vomiting subsides. Call your physician if vomiting continues.  Special Instructions/Symptoms: Your throat may feel dry or sore from the anesthesia or the breathing tube placed in your throat during surgery. If this causes discomfort, gargle with warm salt water. The discomfort should disappear within 24 hours.   Regional Anesthesia Blocks  1. Numbness or the inability to move the "blocked" extremity may last from 3-48 hours after placement. The length of time depends on the medication injected and your individual response to the medication. If the numbness is not going away after 48 hours, call your surgeon.  2. The extremity that is blocked will need to be protected until the numbness is gone and the  Strength has returned. Because you cannot feel it, you will need to take extra care to avoid injury. Because it may be weak, you may have difficulty moving it or using it. You may not know what position it is in without looking at it while  the block is in effect.  3. For blocks in the legs and feet, returning to weight bearing and walking needs to be done carefully. You will need to wait until the numbness is entirely gone and the strength has returned. You should be able to move your leg and foot normally before you try and bear weight or walk. You will need someone to be with you when you first try to ensure you do not fall and possibly risk injury.  4. Bruising and tenderness at the needle site are common side effects and will resolve in a few days.  5. Persistent numbness or new problems with movement should be communicated to the surgeon or the [place] [phone] [place] [phone])  .Call your surgeon if  you experience:   1.  Fever over 101.0. 2.  Inability to urinate. 3.  Nausea and/or vomiting. 4.  Extreme swelling or bruising at the surgical site. 5.  Continued bleeding from the incision. 6.  Increased pain, redness or drainage from the incision. 7.  Problems related to your pain medication.

## 2013-10-26 NOTE — Progress Notes (Signed)
Assisted Dr. Frederick with right, ultrasound guided, interscalene  block. Side rails up, monitors on throughout procedure. See vital signs in flow sheet. Tolerated Procedure well. 

## 2013-10-26 NOTE — Transfer of Care (Signed)
Immediate Anesthesia Transfer of Care Note  Patient: Marie Kelly  Procedure(s) Performed: Procedure(s): RIGHT ELBOW FRACTUE OPEN TREATMENT ULNAR PROXIMAL END OLECRANON PROCESS INCLUDES INTERNAL FIXATION (Right)  Patient Location: PACU  Anesthesia Type:GA combined with regional for post-op pain  Level of Consciousness: awake, alert  and oriented  Airway & Oxygen Therapy: Patient Spontanous Breathing and Patient connected to face mask oxygen  Post-op Assessment: Report given to PACU RN and Post -op Vital signs reviewed and stable  Post vital signs: Reviewed and stable  Complications: No apparent anesthesia complications

## 2013-10-26 NOTE — Anesthesia Postprocedure Evaluation (Signed)
Anesthesia Post Note  Patient: Marie Kelly  Procedure(s) Performed: Procedure(s) (LRB): RIGHT ELBOW FRACTUE OPEN TREATMENT ULNAR PROXIMAL END OLECRANON PROCESS INCLUDES INTERNAL FIXATION (Right)  Anesthesia type: General  Patient location: PACU  Post pain: Pain level controlled  Post assessment: Patient's Cardiovascular Status Stable  Last Vitals:  Filed Vitals:   10/26/13 1700  BP: 159/60  Pulse: 33  Temp:   Resp: 18    Post vital signs: Reviewed and stable  Level of consciousness: alert  Complications: No apparent anesthesia complications

## 2013-10-30 ENCOUNTER — Encounter (HOSPITAL_BASED_OUTPATIENT_CLINIC_OR_DEPARTMENT_OTHER): Payer: Self-pay | Admitting: Orthopedic Surgery

## 2013-11-06 DIAGNOSIS — H47099 Other disorders of optic nerve, not elsewhere classified, unspecified eye: Secondary | ICD-10-CM | POA: Diagnosis not present

## 2013-11-06 DIAGNOSIS — H409 Unspecified glaucoma: Secondary | ICD-10-CM | POA: Diagnosis not present

## 2013-11-06 DIAGNOSIS — H02059 Trichiasis without entropian unspecified eye, unspecified eyelid: Secondary | ICD-10-CM | POA: Diagnosis not present

## 2013-11-06 DIAGNOSIS — H401290 Low-tension glaucoma, unspecified eye, stage unspecified: Secondary | ICD-10-CM | POA: Diagnosis not present

## 2013-11-07 DIAGNOSIS — S5290XD Unspecified fracture of unspecified forearm, subsequent encounter for closed fracture with routine healing: Secondary | ICD-10-CM | POA: Diagnosis not present

## 2013-11-13 DIAGNOSIS — M25629 Stiffness of unspecified elbow, not elsewhere classified: Secondary | ICD-10-CM | POA: Diagnosis not present

## 2013-11-13 DIAGNOSIS — M25529 Pain in unspecified elbow: Secondary | ICD-10-CM | POA: Diagnosis not present

## 2013-11-13 DIAGNOSIS — M6281 Muscle weakness (generalized): Secondary | ICD-10-CM | POA: Diagnosis not present

## 2013-11-14 DIAGNOSIS — M6281 Muscle weakness (generalized): Secondary | ICD-10-CM | POA: Diagnosis not present

## 2013-11-14 DIAGNOSIS — M25629 Stiffness of unspecified elbow, not elsewhere classified: Secondary | ICD-10-CM | POA: Diagnosis not present

## 2013-11-14 DIAGNOSIS — M25529 Pain in unspecified elbow: Secondary | ICD-10-CM | POA: Diagnosis not present

## 2013-11-19 DIAGNOSIS — M25529 Pain in unspecified elbow: Secondary | ICD-10-CM | POA: Diagnosis not present

## 2013-11-21 DIAGNOSIS — M1710 Unilateral primary osteoarthritis, unspecified knee: Secondary | ICD-10-CM | POA: Diagnosis not present

## 2013-11-22 DIAGNOSIS — M25529 Pain in unspecified elbow: Secondary | ICD-10-CM | POA: Diagnosis not present

## 2013-11-22 DIAGNOSIS — M6281 Muscle weakness (generalized): Secondary | ICD-10-CM | POA: Diagnosis not present

## 2013-11-22 DIAGNOSIS — M25629 Stiffness of unspecified elbow, not elsewhere classified: Secondary | ICD-10-CM | POA: Diagnosis not present

## 2013-11-26 DIAGNOSIS — M25529 Pain in unspecified elbow: Secondary | ICD-10-CM | POA: Diagnosis not present

## 2013-11-26 DIAGNOSIS — M6281 Muscle weakness (generalized): Secondary | ICD-10-CM | POA: Diagnosis not present

## 2013-11-26 DIAGNOSIS — M25629 Stiffness of unspecified elbow, not elsewhere classified: Secondary | ICD-10-CM | POA: Diagnosis not present

## 2013-11-29 DIAGNOSIS — M25629 Stiffness of unspecified elbow, not elsewhere classified: Secondary | ICD-10-CM | POA: Diagnosis not present

## 2013-11-29 DIAGNOSIS — M6281 Muscle weakness (generalized): Secondary | ICD-10-CM | POA: Diagnosis not present

## 2013-11-29 DIAGNOSIS — M25529 Pain in unspecified elbow: Secondary | ICD-10-CM | POA: Diagnosis not present

## 2013-12-03 DIAGNOSIS — M25529 Pain in unspecified elbow: Secondary | ICD-10-CM | POA: Diagnosis not present

## 2013-12-03 DIAGNOSIS — M6281 Muscle weakness (generalized): Secondary | ICD-10-CM | POA: Diagnosis not present

## 2013-12-03 DIAGNOSIS — M25629 Stiffness of unspecified elbow, not elsewhere classified: Secondary | ICD-10-CM | POA: Diagnosis not present

## 2013-12-05 DIAGNOSIS — S5290XD Unspecified fracture of unspecified forearm, subsequent encounter for closed fracture with routine healing: Secondary | ICD-10-CM | POA: Diagnosis not present

## 2013-12-06 DIAGNOSIS — M25529 Pain in unspecified elbow: Secondary | ICD-10-CM | POA: Diagnosis not present

## 2013-12-06 DIAGNOSIS — M6281 Muscle weakness (generalized): Secondary | ICD-10-CM | POA: Diagnosis not present

## 2013-12-06 DIAGNOSIS — M25629 Stiffness of unspecified elbow, not elsewhere classified: Secondary | ICD-10-CM | POA: Diagnosis not present

## 2013-12-10 DIAGNOSIS — M25529 Pain in unspecified elbow: Secondary | ICD-10-CM | POA: Diagnosis not present

## 2013-12-10 DIAGNOSIS — M6281 Muscle weakness (generalized): Secondary | ICD-10-CM | POA: Diagnosis not present

## 2013-12-10 DIAGNOSIS — M25629 Stiffness of unspecified elbow, not elsewhere classified: Secondary | ICD-10-CM | POA: Diagnosis not present

## 2013-12-13 DIAGNOSIS — M25629 Stiffness of unspecified elbow, not elsewhere classified: Secondary | ICD-10-CM | POA: Diagnosis not present

## 2013-12-13 DIAGNOSIS — M25529 Pain in unspecified elbow: Secondary | ICD-10-CM | POA: Diagnosis not present

## 2013-12-13 DIAGNOSIS — M6281 Muscle weakness (generalized): Secondary | ICD-10-CM | POA: Diagnosis not present

## 2013-12-17 DIAGNOSIS — M6281 Muscle weakness (generalized): Secondary | ICD-10-CM | POA: Diagnosis not present

## 2013-12-17 DIAGNOSIS — M25629 Stiffness of unspecified elbow, not elsewhere classified: Secondary | ICD-10-CM | POA: Diagnosis not present

## 2013-12-17 DIAGNOSIS — M25529 Pain in unspecified elbow: Secondary | ICD-10-CM | POA: Diagnosis not present

## 2013-12-20 DIAGNOSIS — M25629 Stiffness of unspecified elbow, not elsewhere classified: Secondary | ICD-10-CM | POA: Diagnosis not present

## 2013-12-20 DIAGNOSIS — M25529 Pain in unspecified elbow: Secondary | ICD-10-CM | POA: Diagnosis not present

## 2013-12-20 DIAGNOSIS — M6281 Muscle weakness (generalized): Secondary | ICD-10-CM | POA: Diagnosis not present

## 2013-12-25 DIAGNOSIS — M25529 Pain in unspecified elbow: Secondary | ICD-10-CM | POA: Diagnosis not present

## 2013-12-25 DIAGNOSIS — M25629 Stiffness of unspecified elbow, not elsewhere classified: Secondary | ICD-10-CM | POA: Diagnosis not present

## 2013-12-25 DIAGNOSIS — M6281 Muscle weakness (generalized): Secondary | ICD-10-CM | POA: Diagnosis not present

## 2013-12-27 DIAGNOSIS — M6281 Muscle weakness (generalized): Secondary | ICD-10-CM | POA: Diagnosis not present

## 2013-12-27 DIAGNOSIS — M25529 Pain in unspecified elbow: Secondary | ICD-10-CM | POA: Diagnosis not present

## 2013-12-27 DIAGNOSIS — M25629 Stiffness of unspecified elbow, not elsewhere classified: Secondary | ICD-10-CM | POA: Diagnosis not present

## 2013-12-31 DIAGNOSIS — M25529 Pain in unspecified elbow: Secondary | ICD-10-CM | POA: Diagnosis not present

## 2013-12-31 DIAGNOSIS — M6281 Muscle weakness (generalized): Secondary | ICD-10-CM | POA: Diagnosis not present

## 2013-12-31 DIAGNOSIS — M25629 Stiffness of unspecified elbow, not elsewhere classified: Secondary | ICD-10-CM | POA: Diagnosis not present

## 2014-01-02 DIAGNOSIS — S5290XD Unspecified fracture of unspecified forearm, subsequent encounter for closed fracture with routine healing: Secondary | ICD-10-CM | POA: Diagnosis not present

## 2014-01-02 DIAGNOSIS — M25569 Pain in unspecified knee: Secondary | ICD-10-CM | POA: Diagnosis not present

## 2014-01-02 DIAGNOSIS — M1710 Unilateral primary osteoarthritis, unspecified knee: Secondary | ICD-10-CM | POA: Diagnosis not present

## 2014-01-03 DIAGNOSIS — M6281 Muscle weakness (generalized): Secondary | ICD-10-CM | POA: Diagnosis not present

## 2014-01-03 DIAGNOSIS — M25629 Stiffness of unspecified elbow, not elsewhere classified: Secondary | ICD-10-CM | POA: Diagnosis not present

## 2014-01-03 DIAGNOSIS — M25529 Pain in unspecified elbow: Secondary | ICD-10-CM | POA: Diagnosis not present

## 2014-01-07 DIAGNOSIS — M25529 Pain in unspecified elbow: Secondary | ICD-10-CM | POA: Diagnosis not present

## 2014-01-07 DIAGNOSIS — M25629 Stiffness of unspecified elbow, not elsewhere classified: Secondary | ICD-10-CM | POA: Diagnosis not present

## 2014-01-07 DIAGNOSIS — M6281 Muscle weakness (generalized): Secondary | ICD-10-CM | POA: Diagnosis not present

## 2014-01-10 DIAGNOSIS — M6281 Muscle weakness (generalized): Secondary | ICD-10-CM | POA: Diagnosis not present

## 2014-01-10 DIAGNOSIS — M25529 Pain in unspecified elbow: Secondary | ICD-10-CM | POA: Diagnosis not present

## 2014-01-10 DIAGNOSIS — M25629 Stiffness of unspecified elbow, not elsewhere classified: Secondary | ICD-10-CM | POA: Diagnosis not present

## 2014-01-18 DIAGNOSIS — M6281 Muscle weakness (generalized): Secondary | ICD-10-CM | POA: Diagnosis not present

## 2014-01-18 DIAGNOSIS — M25629 Stiffness of unspecified elbow, not elsewhere classified: Secondary | ICD-10-CM | POA: Diagnosis not present

## 2014-01-18 DIAGNOSIS — M25529 Pain in unspecified elbow: Secondary | ICD-10-CM | POA: Diagnosis not present

## 2014-01-24 DIAGNOSIS — M25629 Stiffness of unspecified elbow, not elsewhere classified: Secondary | ICD-10-CM | POA: Diagnosis not present

## 2014-01-24 DIAGNOSIS — M6281 Muscle weakness (generalized): Secondary | ICD-10-CM | POA: Diagnosis not present

## 2014-01-24 DIAGNOSIS — M25529 Pain in unspecified elbow: Secondary | ICD-10-CM | POA: Diagnosis not present

## 2014-01-28 ENCOUNTER — Other Ambulatory Visit: Payer: Self-pay

## 2014-01-28 DIAGNOSIS — Z1231 Encounter for screening mammogram for malignant neoplasm of breast: Secondary | ICD-10-CM

## 2014-01-31 DIAGNOSIS — M25529 Pain in unspecified elbow: Secondary | ICD-10-CM | POA: Diagnosis not present

## 2014-02-04 DIAGNOSIS — Z23 Encounter for immunization: Secondary | ICD-10-CM | POA: Diagnosis not present

## 2014-02-06 ENCOUNTER — Ambulatory Visit
Admission: RE | Admit: 2014-02-06 | Discharge: 2014-02-06 | Disposition: A | Discharge status: Home / Self Care | Payer: Medicare Other | Source: Ambulatory Visit

## 2014-02-06 DIAGNOSIS — Z1231 Encounter for screening mammogram for malignant neoplasm of breast: Secondary | ICD-10-CM | POA: Diagnosis not present

## 2014-02-11 ENCOUNTER — Other Ambulatory Visit: Payer: Self-pay | Admitting: Family Medicine

## 2014-02-22 ENCOUNTER — Ambulatory Visit (HOSPITAL_BASED_OUTPATIENT_CLINIC_OR_DEPARTMENT_OTHER): Admission: RE | Admit: 2014-02-22 | Payer: Medicare Other | Source: Ambulatory Visit | Admitting: Orthopedic Surgery

## 2014-02-22 ENCOUNTER — Encounter (HOSPITAL_BASED_OUTPATIENT_CLINIC_OR_DEPARTMENT_OTHER): Admission: RE | Payer: Self-pay | Source: Ambulatory Visit

## 2014-02-22 SURGERY — OPEN REDUCTION INTERNAL FIXATION (ORIF) ELBOW/OLECRANON FRACTURE
Anesthesia: General | Laterality: Right

## 2014-03-14 DIAGNOSIS — M25521 Pain in right elbow: Secondary | ICD-10-CM | POA: Diagnosis not present

## 2014-03-14 DIAGNOSIS — M1711 Unilateral primary osteoarthritis, right knee: Secondary | ICD-10-CM | POA: Diagnosis not present

## 2014-04-16 ENCOUNTER — Encounter (HOSPITAL_BASED_OUTPATIENT_CLINIC_OR_DEPARTMENT_OTHER): Payer: Self-pay | Admitting: *Deleted

## 2014-04-16 NOTE — Progress Notes (Signed)
No labs needed-was here 6/15-said he is taking a bone graft from lt leg-be sure that is on permit if not-

## 2014-04-19 ENCOUNTER — Ambulatory Visit (HOSPITAL_BASED_OUTPATIENT_CLINIC_OR_DEPARTMENT_OTHER)
Admission: RE | Admit: 2014-04-19 | Discharge: 2014-04-19 | Disposition: A | Discharge status: Home / Self Care | Payer: Medicare Other | Source: Ambulatory Visit | Attending: Orthopedic Surgery | Admitting: Orthopedic Surgery

## 2014-04-19 ENCOUNTER — Encounter (HOSPITAL_BASED_OUTPATIENT_CLINIC_OR_DEPARTMENT_OTHER)
Admission: RE | Disposition: A | Discharge status: Home / Self Care | Payer: Self-pay | Source: Ambulatory Visit | Attending: Orthopedic Surgery

## 2014-04-19 ENCOUNTER — Ambulatory Visit (HOSPITAL_BASED_OUTPATIENT_CLINIC_OR_DEPARTMENT_OTHER): Payer: Medicare Other | Admitting: Certified Registered"

## 2014-04-19 ENCOUNTER — Encounter (HOSPITAL_BASED_OUTPATIENT_CLINIC_OR_DEPARTMENT_OTHER): Payer: Self-pay | Admitting: *Deleted

## 2014-04-19 DIAGNOSIS — S52021A Displaced fracture of olecranon process without intraarticular extension of right ulna, initial encounter for closed fracture: Secondary | ICD-10-CM | POA: Diagnosis not present

## 2014-04-19 DIAGNOSIS — S52021K Displaced fracture of olecranon process without intraarticular extension of right ulna, subsequent encounter for closed fracture with nonunion: Secondary | ICD-10-CM | POA: Insufficient documentation

## 2014-04-19 DIAGNOSIS — T84498A Other mechanical complication of other internal orthopedic devices, implants and grafts, initial encounter: Secondary | ICD-10-CM | POA: Diagnosis not present

## 2014-04-19 DIAGNOSIS — X58XXXD Exposure to other specified factors, subsequent encounter: Secondary | ICD-10-CM | POA: Insufficient documentation

## 2014-04-19 DIAGNOSIS — Z87891 Personal history of nicotine dependence: Secondary | ICD-10-CM | POA: Diagnosis not present

## 2014-04-19 DIAGNOSIS — E785 Hyperlipidemia, unspecified: Secondary | ICD-10-CM | POA: Insufficient documentation

## 2014-04-19 DIAGNOSIS — J45909 Unspecified asthma, uncomplicated: Secondary | ICD-10-CM | POA: Diagnosis not present

## 2014-04-19 DIAGNOSIS — E039 Hypothyroidism, unspecified: Secondary | ICD-10-CM | POA: Diagnosis not present

## 2014-04-19 DIAGNOSIS — G8918 Other acute postprocedural pain: Secondary | ICD-10-CM | POA: Diagnosis not present

## 2014-04-19 DIAGNOSIS — K219 Gastro-esophageal reflux disease without esophagitis: Secondary | ICD-10-CM | POA: Insufficient documentation

## 2014-04-19 DIAGNOSIS — M25521 Pain in right elbow: Secondary | ICD-10-CM | POA: Diagnosis not present

## 2014-04-19 DIAGNOSIS — S52031K Displaced fracture of olecranon process with intraarticular extension of right ulna, subsequent encounter for closed fracture with nonunion: Secondary | ICD-10-CM

## 2014-04-19 DIAGNOSIS — S52001A Unspecified fracture of upper end of right ulna, initial encounter for closed fracture: Secondary | ICD-10-CM | POA: Diagnosis not present

## 2014-04-19 HISTORY — PX: HARVEST BONE GRAFT: SHX377

## 2014-04-19 HISTORY — DX: Presence of dental prosthetic device (complete) (partial): Z97.2

## 2014-04-19 HISTORY — DX: Presence of spectacles and contact lenses: Z97.3

## 2014-04-19 HISTORY — DX: Displaced fracture of olecranon process with intraarticular extension of right ulna, subsequent encounter for closed fracture with nonunion: S52.031K

## 2014-04-19 HISTORY — DX: Adverse effect of unspecified anesthetic, initial encounter: T41.45XA

## 2014-04-19 HISTORY — DX: Other complications of anesthesia, initial encounter: T88.59XA

## 2014-04-19 HISTORY — PX: ORIF ELBOW FRACTURE: SHX5031

## 2014-04-19 LAB — POCT HEMOGLOBIN-HEMACUE: HEMOGLOBIN: 14.8 g/dL (ref 12.0–15.0)

## 2014-04-19 SURGERY — OPEN REDUCTION INTERNAL FIXATION (ORIF) ELBOW/OLECRANON FRACTURE
Anesthesia: General | Site: Leg Lower | Laterality: Right

## 2014-04-19 MED ORDER — DEXAMETHASONE SODIUM PHOSPHATE 4 MG/ML IJ SOLN
INTRAMUSCULAR | Status: DC | PRN
  Administered 2014-04-19: 10 mg via INTRAVENOUS

## 2014-04-19 MED ORDER — PROPOFOL 10 MG/ML IV EMUL
INTRAVENOUS | Status: AC
  Filled 2014-04-19: qty 50

## 2014-04-19 MED ORDER — MIDAZOLAM HCL 2 MG/2ML IJ SOLN
INTRAMUSCULAR | Status: AC
  Filled 2014-04-19: qty 2

## 2014-04-19 MED ORDER — ONDANSETRON HCL 4 MG/2ML IJ SOLN: 4.0000 mg | Freq: Once | INTRAMUSCULAR | Status: DC | PRN

## 2014-04-19 MED ORDER — BUPIVACAINE-EPINEPHRINE (PF) 0.5% -1:200000 IJ SOLN
INTRAMUSCULAR | Status: AC
  Filled 2014-04-19: qty 30

## 2014-04-19 MED ORDER — FENTANYL CITRATE 0.05 MG/ML IJ SOLN
INTRAMUSCULAR | Status: DC | PRN
  Administered 2014-04-19 (×3): 25 ug via INTRAVENOUS

## 2014-04-19 MED ORDER — BUPIVACAINE HCL (PF) 0.25 % IJ SOLN
INTRAMUSCULAR | Status: AC
  Filled 2014-04-19: qty 30

## 2014-04-19 MED ORDER — BUPIVACAINE-EPINEPHRINE (PF) 0.25% -1:200000 IJ SOLN
INTRAMUSCULAR | Status: AC
  Filled 2014-04-19: qty 30

## 2014-04-19 MED ORDER — ONDANSETRON HCL 4 MG/2ML IJ SOLN
INTRAMUSCULAR | Status: DC | PRN
  Administered 2014-04-19: 4 mg via INTRAVENOUS

## 2014-04-19 MED ORDER — BUPIVACAINE HCL (PF) 0.5 % IJ SOLN
INTRAMUSCULAR | Status: AC
  Filled 2014-04-19: qty 30

## 2014-04-19 MED ORDER — FENTANYL CITRATE 0.05 MG/ML IJ SOLN
50.0000 ug | INTRAMUSCULAR | Status: DC | PRN
  Administered 2014-04-19: 100 ug via INTRAVENOUS

## 2014-04-19 MED ORDER — ONDANSETRON HCL 4 MG PO TABS: 4.0000 mg | ORAL_TABLET | Freq: Three times a day (TID) | ORAL | Status: DC | PRN

## 2014-04-19 MED ORDER — BACLOFEN 10 MG PO TABS: 10.0000 mg | ORAL_TABLET | Freq: Three times a day (TID) | ORAL | Status: DC

## 2014-04-19 MED ORDER — OXYCODONE HCL 5 MG PO TABS: 5.0000 mg | ORAL_TABLET | Freq: Once | ORAL | Status: DC | PRN

## 2014-04-19 MED ORDER — MIDAZOLAM HCL 2 MG/2ML IJ SOLN
1.0000 mg | INTRAMUSCULAR | Status: DC | PRN
  Administered 2014-04-19: 2 mg via INTRAVENOUS

## 2014-04-19 MED ORDER — FENTANYL CITRATE 0.05 MG/ML IJ SOLN
INTRAMUSCULAR | Status: AC
  Filled 2014-04-19: qty 8

## 2014-04-19 MED ORDER — LACTATED RINGERS IV SOLN
INTRAVENOUS | Status: DC
  Administered 2014-04-19 (×3): via INTRAVENOUS

## 2014-04-19 MED ORDER — CEFAZOLIN SODIUM-DEXTROSE 2-3 GM-% IV SOLR
INTRAVENOUS | Status: AC
  Filled 2014-04-19: qty 50

## 2014-04-19 MED ORDER — BUPIVACAINE HCL (PF) 0.25 % IJ SOLN
INTRAMUSCULAR | Status: DC | PRN
  Administered 2014-04-19: 10 mL

## 2014-04-19 MED ORDER — PROPOFOL 10 MG/ML IV BOLUS
INTRAVENOUS | Status: DC | PRN
  Administered 2014-04-19: 150 mg via INTRAVENOUS

## 2014-04-19 MED ORDER — SENNA-DOCUSATE SODIUM 8.6-50 MG PO TABS: 2.0000 | ORAL_TABLET | Freq: Every day | ORAL | Status: DC

## 2014-04-19 MED ORDER — MIDAZOLAM HCL 5 MG/5ML IJ SOLN
INTRAMUSCULAR | Status: DC | PRN
  Administered 2014-04-19: 1 mg via INTRAVENOUS

## 2014-04-19 MED ORDER — CEFAZOLIN SODIUM-DEXTROSE 2-3 GM-% IV SOLR
2.0000 g | INTRAVENOUS | Status: AC
  Administered 2014-04-19: 2 g via INTRAVENOUS

## 2014-04-19 MED ORDER — HYDROMORPHONE HCL 1 MG/ML IJ SOLN: 0.2500 mg | INTRAMUSCULAR | Status: DC | PRN

## 2014-04-19 MED ORDER — LIDOCAINE HCL (CARDIAC) 20 MG/ML IV SOLN
INTRAVENOUS | Status: DC | PRN
  Administered 2014-04-19: 30 mg via INTRAVENOUS

## 2014-04-19 MED ORDER — FENTANYL CITRATE 0.05 MG/ML IJ SOLN
INTRAMUSCULAR | Status: AC
  Filled 2014-04-19: qty 2

## 2014-04-19 MED ORDER — OXYCODONE-ACETAMINOPHEN 10-325 MG PO TABS: 1.0000 | ORAL_TABLET | Freq: Four times a day (QID) | ORAL | Status: DC | PRN

## 2014-04-19 MED ORDER — OXYCODONE HCL 5 MG/5ML PO SOLN: 5.0000 mg | Freq: Once | ORAL | Status: DC | PRN

## 2014-04-19 SURGICAL SUPPLY — 101 items
BAG DECANTER FOR FLEXI CONT (MISCELLANEOUS) IMPLANT
BANDAGE ELASTIC 3 VELCRO ST LF (GAUZE/BANDAGES/DRESSINGS) IMPLANT
BANDAGE ELASTIC 4 VELCRO ST LF (GAUZE/BANDAGES/DRESSINGS) ×8 IMPLANT
BANDAGE ELASTIC 6 VELCRO ST LF (GAUZE/BANDAGES/DRESSINGS) IMPLANT
BANDAGE ESMARK 6X9 LF (GAUZE/BANDAGES/DRESSINGS) IMPLANT
BENZOIN TINCTURE PRP APPL 2/3 (GAUZE/BANDAGES/DRESSINGS) ×4 IMPLANT
BIT DRILL 2.3 QUICK RELEASE (BIT) ×3 IMPLANT
BLADE AVERAGE 25X9 (BLADE) IMPLANT
BLADE MINI RND TIP GREEN BEAV (BLADE) IMPLANT
BLADE SURG 15 STRL LF DISP TIS (BLADE) ×12 IMPLANT
BLADE SURG 15 STRL SS (BLADE) ×4
BLADE VORTEX 6.0 (BLADE) IMPLANT
BNDG COHESIVE 4X5 TAN STRL (GAUZE/BANDAGES/DRESSINGS) ×4 IMPLANT
BNDG ELASTIC 2 VLCR STRL LF (GAUZE/BANDAGES/DRESSINGS) IMPLANT
BNDG ESMARK 4X9 LF (GAUZE/BANDAGES/DRESSINGS) ×4 IMPLANT
BNDG ESMARK 6X9 LF (GAUZE/BANDAGES/DRESSINGS)
CANISTER SUCT 1200ML W/VALVE (MISCELLANEOUS) ×4 IMPLANT
CLSR STERI-STRIP ANTIMIC 1/2X4 (GAUZE/BANDAGES/DRESSINGS) ×4 IMPLANT
COVER BACK TABLE 60X90IN (DRAPES) ×4 IMPLANT
CUFF TOURNIQUET SINGLE 18IN (TOURNIQUET CUFF) IMPLANT
CUFF TOURNIQUET SINGLE 34IN LL (TOURNIQUET CUFF) ×4 IMPLANT
DECANTER SPIKE VIAL GLASS SM (MISCELLANEOUS) ×4 IMPLANT
DRAPE EXTREMITY T 121X128X90 (DRAPE) ×4 IMPLANT
DRAPE INCISE IOBAN 66X45 STRL (DRAPES) ×4 IMPLANT
DRAPE OEC MINIVIEW 54X84 (DRAPES) ×4 IMPLANT
DRAPE U 20/CS (DRAPES) ×4 IMPLANT
DRAPE U-SHAPE 47X51 STRL (DRAPES) ×8 IMPLANT
DRAPE U-SHAPE 76X120 STRL (DRAPES) IMPLANT
DRILL 2.3 QUICK RELEASE (BIT) ×4
DURAPREP 26ML APPLICATOR (WOUND CARE) ×8 IMPLANT
ELECT REM PT RETURN 9FT ADLT (ELECTROSURGICAL) ×4
ELECTRODE REM PT RTRN 9FT ADLT (ELECTROSURGICAL) ×3 IMPLANT
GAUZE SPONGE 4X4 12PLY STRL (GAUZE/BANDAGES/DRESSINGS) ×4 IMPLANT
GAUZE XEROFORM 1X8 LF (GAUZE/BANDAGES/DRESSINGS) IMPLANT
GLOVE BIO SURGEON STRL SZ 6.5 (GLOVE) ×4 IMPLANT
GLOVE BIO SURGEON STRL SZ8 (GLOVE) ×4 IMPLANT
GLOVE BIOGEL PI IND STRL 7.0 (GLOVE) ×3 IMPLANT
GLOVE BIOGEL PI IND STRL 8 (GLOVE) ×6 IMPLANT
GLOVE BIOGEL PI INDICATOR 7.0 (GLOVE) ×1
GLOVE BIOGEL PI INDICATOR 8 (GLOVE) ×2
GLOVE ORTHO TXT STRL SZ7.5 (GLOVE) ×4 IMPLANT
GOWN STRL REUS W/ TWL LRG LVL3 (GOWN DISPOSABLE) ×3 IMPLANT
GOWN STRL REUS W/ TWL XL LVL3 (GOWN DISPOSABLE) ×6 IMPLANT
GOWN STRL REUS W/TWL LRG LVL3 (GOWN DISPOSABLE) ×1
GOWN STRL REUS W/TWL XL LVL3 (GOWN DISPOSABLE) ×2
GUIDEWIRE ORTH 6X062XTROC NS (WIRE) ×6 IMPLANT
GUIDEWIRE ORTHO 2.0X9 ST (WIRE) ×4 IMPLANT
IV CATH AUTO 14GX1.75 SAFE ORG (IV SOLUTION) ×4 IMPLANT
K-WIRE .062 (WIRE) ×2
NDL SUT 6 .5 CRC .975X.05 MAYO (NEEDLE) IMPLANT
NEEDLE HYPO 25X1 1.5 SAFETY (NEEDLE) IMPLANT
NEEDLE MAYO TAPER (NEEDLE)
NS IRRIG 1000ML POUR BTL (IV SOLUTION) ×4 IMPLANT
PACK ARTHROSCOPY DSU (CUSTOM PROCEDURE TRAY) ×4 IMPLANT
PACK BASIN DAY SURGERY FS (CUSTOM PROCEDURE TRAY) ×4 IMPLANT
PAD CAST 3X4 CTTN HI CHSV (CAST SUPPLIES) ×3 IMPLANT
PAD CAST 4YDX4 CTTN HI CHSV (CAST SUPPLIES) ×3 IMPLANT
PADDING CAST ABS 4INX4YD NS (CAST SUPPLIES) ×1
PADDING CAST ABS COTTON 4X4 ST (CAST SUPPLIES) ×3 IMPLANT
PADDING CAST COTTON 3X4 STRL (CAST SUPPLIES) ×1
PADDING CAST COTTON 4X4 STRL (CAST SUPPLIES) ×1
PADDING CAST COTTON 6X4 STRL (CAST SUPPLIES) IMPLANT
PADDING UNDERCAST 2 STRL (CAST SUPPLIES)
PADDING UNDERCAST 2X4 STRL (CAST SUPPLIES) IMPLANT
PENCIL BUTTON HOLSTER BLD 10FT (ELECTRODE) ×4 IMPLANT
SHEET MEDIUM DRAPE 40X70 STRL (DRAPES) IMPLANT
SLEEVE SCD COMPRESS KNEE MED (MISCELLANEOUS) ×4 IMPLANT
SLING ARM FOAM STRAP LRG (SOFTGOODS) ×4 IMPLANT
SPLINT FAST PLASTER 5X30 (CAST SUPPLIES) ×10
SPLINT PLASTER CAST FAST 5X30 (CAST SUPPLIES) ×30 IMPLANT
SPLINT PLASTER CAST XFAST 4X15 (CAST SUPPLIES) IMPLANT
SPLINT PLASTER XTRA FAST SET 4 (CAST SUPPLIES)
SPONGE LAP 4X18 X RAY DECT (DISPOSABLE) ×4 IMPLANT
STAPLER VISISTAT (STAPLE) IMPLANT
STAPLER VISISTAT 35W (STAPLE) IMPLANT
STOCKINETTE 4X48 STRL (DRAPES) IMPLANT
STOCKINETTE 6  STRL (DRAPES)
STOCKINETTE 6 STRL (DRAPES) IMPLANT
STOCKINETTE IMPERVIOUS LG (DRAPES) ×4 IMPLANT
SUCTION FRAZIER TIP 10 FR DISP (SUCTIONS) ×4 IMPLANT
SUT ETHIBOND 0 MO6 C/R (SUTURE) IMPLANT
SUT ETHILON 3 0 PS 1 (SUTURE) IMPLANT
SUT ETHILON 4 0 PS 2 18 (SUTURE) IMPLANT
SUT FIBERWIRE #2 38 T-5 BLUE (SUTURE) ×4
SUT MNCRL AB 4-0 PS2 18 (SUTURE) ×4 IMPLANT
SUT STEEL 7 (SUTURE) ×4 IMPLANT
SUT VIC AB 0 CT1 27 (SUTURE)
SUT VIC AB 0 CT1 27XBRD ANBCTR (SUTURE) IMPLANT
SUT VIC AB 0 SH 27 (SUTURE) IMPLANT
SUT VIC AB 2-0 SH 27 (SUTURE) ×1
SUT VIC AB 2-0 SH 27XBRD (SUTURE) ×3 IMPLANT
SUT VIC AB 3-0 SH 27 (SUTURE)
SUT VIC AB 3-0 SH 27X BRD (SUTURE) IMPLANT
SUT VICRYL 3-0 CR8 SH (SUTURE) ×4 IMPLANT
SUTURE FIBERWR #2 38 T-5 BLUE (SUTURE) ×3 IMPLANT
SYR BULB 3OZ (MISCELLANEOUS) ×4 IMPLANT
SYR CONTROL 10ML LL (SYRINGE) IMPLANT
TOWEL OR 17X24 6PK STRL BLUE (TOWEL DISPOSABLE) ×8 IMPLANT
TUBE CONNECTING 20X1/4 (TUBING) ×4 IMPLANT
UNDERPAD 30X30 INCONTINENT (UNDERPADS AND DIAPERS) ×4 IMPLANT
YANKAUER SUCT BULB TIP NO VENT (SUCTIONS) ×4 IMPLANT

## 2014-04-19 NOTE — Op Note (Signed)
04/19/2014  9:30 AM  PATIENT:  Marie Kelly    PRE-OPERATIVE DIAGNOSIS:  Right olecranon fracture nonunion  POST-OPERATIVE DIAGNOSIS:  Same  PROCEDURE:  OPEN REDUCTION INTERNAL FIXATION (ORIF) RIGHT ELBOW/OLECRANON NONUNION FRACTURE WITH HARDWARE REMOVAL, HARVEST LEFT TIBIA BONE GRAFT   SURGEON:  Johnny Bridge, MD  PHYSICIAN ASSISTANT: Joya Gaskins, OPA-C, present and scrubbed throughout the case, critical for completion in a timely fashion, and for retraction, instrumentation, and closure.  ANESTHESIA:   General  PREOPERATIVE INDICATIONS:  Marie Kelly is a  68 y.o. female who broke her right elbow 6 months ago and had open reduction internal fixation. This was a 4 part proximal ulna fracture, and portions of the fracture healed, however there was a substantial amount that did not. She continued to have persistent pain around the elbow and elected for surgical management.  The risks benefits and alternatives were discussed with the patient preoperatively including but not limited to the risks of infection, bleeding, nerve injury, cardiopulmonary complications, the need for revision surgery, among others, and the patient was willing to proceed. We also discussed risks for recurrent nonunion, hardware prominence, hardware failure, the need for hardware removal,among others.  OPERATIVE IMPLANTS: I removed a Acumed olecranon plate, and replaced this with a total of 20.0625 inch K wires with a total of 2 18-gauge tension band wires augmented by a #2 FiberWire placed through the triceps tendon and then down through drill holes from the previous screw holes in the ulna.  OPERATIVE FINDINGS: there was a fibrous nonunion of 80% of the fracture site of the proximal ulna. 20% on the radial side had actually united, such that the articular piece was still maintained and the anatomical location, with bridging solid bone, however 80% of the primary fracture line had not united.  OPERATIVE  PROCEDURE:  The patient was brought to the operating room and placed in the supine position. Gen. Anesthesia was administered. IV antibiotics were given.the left lower extremity was prepped and draped in usual sterile fashion along with the right upper extremity. Time out was performed. Incision was made over the proximal left medial tibia, after tourniquet was utilized, total tourniquet time was 10 minutes. Incision was made and dissection carried down to the proximal medial metaphysis. A 1 x 1 cm window was placed in the cortical bone using a K wire to trephinate bone, and then an osteotome was used to open the window, and then I used a curette to remove cancellus bone graft. The window was then closed.  I then went to the ulna, and posterior approach to the olecranon was carried out. I removed the plate without difficulty. Fracture site was identified and it was filled with fibrous tissue, with the exception of the radial 20% of the fractured I did have a united bridge of bone. I left this intact, but excavated the fracture site. Once I had cleaned cancellus bony exposure, I used a #2 FiberWire through some of the locking holes that were left residual within the bone of the proximal ulna shaft, and then took the suture up and down the triceps tendon, and then passed them out the shaft again.  I then packed the bone graft into the fracture defect, completely filling the deepest defect with autograft bone.  I then used a total of 2  0.0625 inch K wires placed through the central portion of the remaining bone piece, directly into the ulna just skimming the undersurface of the articular surface taking care to maintain position outside the  joint, one of the K wires was able to be buried into the anterior cortex, while the other K wire just barely skirted down into the shaft canal. I tried redirecting to grab the cortex, but it did not give me that purchase, however I had one of the K wires being bicortical, and  the angle was extremely challenging because of the small size of the posterior fragment, and I wanted to optimize purchase on that fragment, which necessitated a more shallow entry, which resulted in the intramedullary K wire. Nonetheless I had excellent overall fixation, particularly in light of the fact that this was really an augmentation of stability, given the fact that 20% of the bone did heal and the fracture fragment was not actually fully mobile, this is really a biologic operation to restore the 80% missing bone at the fracture site that had resulted in a fibrous union.  I confirmed position on AP and lateral views, and then placed a drill hole in the ulna just between 2 of the previous screw holes in order to try and minimize stress riser. I placed an 18-gauge wire in a figure-of-eight cerclage-type fashion, securing and twisting the wire on the radial aspect.  The K wires were then bent and cut and delivered down against the triceps tendon, thus providing excellent fixation of the tension band.  I then irrigated the wounds copiously, took final C-arm pictures, and repaired the fascia with Vicryl both over the tibial harvest site as well as the olecranon, followed by Vicryl for the subcutaneous tissue with Steri-Strips and sterile gauze. The elbow was dressed with a posterior splint. She was awakened and returned to the PACU in stable and satisfactory condition. There were no complications and she tolerated the procedure well.

## 2014-04-19 NOTE — H&P (Signed)
PREOPERATIVE H&P  Chief Complaint: Right olecranon nonunion  HPI: Marie Kelly is a 68 y.o. female who presents for preoperative history and physical who broke her elbow about 6 months ago, and underwent surgical fixation, but unfortunately failed to heal. She has had persistent symptoms of pain with radiographic loosening of the ulna fracture. There is also been lysis around the fracture site. She never had any evidence for infection, with no drainage. Ongoing persistent moderate pain, with difficulty using the arm.  Past Medical History  Diagnosis Date  . Colon polyps   . GERD (gastroesophageal reflux disease)     had endo? taking nexium 3 x per week  . Diverticulosis   . Hyperlipidemia     had been on lipitor changes to pravachol cause of cost and tricor has since stopped  NO  se reported   . Anemia   . Impaired fasting glucose   . Hypothyroidism      on meds for years  . Asthma   . Olecranon fracture 10/25/2013    right arm  . Ventricular bigeminy seen on cardiac monitor     Cardiac work up Dr Stanford Breed [facility]  . Fracture of right olecranon process 10/26/2013  . Wears glasses   . Wears dentures     top-partial bottom  . Complication of anesthesia     2 days after surgery-muscles hurtand spasmed-needed a muscle relaxant    Past Surgical History  Procedure Laterality Date  . Tubal ligation    . Cholecystectomy    . Uterus repair    . Hemorroidectomy    . Tonsillectomy    . Belpharoptosis repair Bilateral   . Orif elbow fracture Right 10/26/2013    Procedure: RIGHT ELBOW FRACTUE OPEN TREATMENT ULNAR PROXIMAL END OLECRANON PROCESS INCLUDES INTERNAL FIXATION;  Surgeon: Johnny Bridge, MD;  Location: [place];  Service: Orthopedics;  Laterality: Right;  . Colonoscopy     History   Social History  . Marital Status: Married    Spouse Name: N/A    Number of Children: 3  . Years of Education: N/A   Social History Main Topics  . Smoking status: Former  Smoker    Quit date: 10/25/1973  . Smokeless tobacco: Never Used  . Alcohol Use: Yes     Comment: socially  . Drug Use: No  . Sexual Activity: None   Other Topics Concern  . 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    Gravida 3 para 3   Last td 6 2008   HS educated       REmote hx of tobacco 1.5 ppd for 6 years as a teen Q 50   Family History  Problem Relation Age of Onset  . Stroke Mother   . Diabetes Father   . Alzheimer's disease    . Stomach cancer Maternal Aunt   . Colon cancer Neg Hx   . Pancreatic cancer Neg Hx   . Heart disease Father    Allergies  Allergen Reactions  . Hydrochlorothiazide     Muscles tightness and felt like she was hit by a truck   Prior to Admission medications   Medication Sig Start Date End Date Taking? Authorizing Provider  esomeprazole (Kennard) 40  MG capsule Take 40 mg by mouth as needed.   Yes Historical Provider, MD  Fluticasone-Salmeterol (ADVAIR) 250-50 MCG/DOSE AEPB Inhale 1 puff into the lungs every 12 (twelve) hours.   Yes Historical Provider, MD  latanoprost (XALATAN) 0.005 % ophthalmic solution 1 drop at bedtime.   Yes Historical Provider, MD  levothyroxine (SYNTHROID, LEVOTHROID) 50 MCG tablet TAKE 1 TABLET DAILY 05/30/13  Yes Burnis Medin, MD  montelukast (SINGULAIR) 10 MG tablet TAKE 1 TABLET AT BEDTIME 05/30/13  Yes Burnis Medin, MD  sennosides-docusate sodium (SENOKOT-S) 8.6-50 MG tablet Take 2 tablets by mouth daily. 10/26/13  Yes Johnny Bridge, MD  tolterodine (DETROL LA) 2 MG 24 hr capsule Take 1 capsule (2 mg total) by mouth daily. 03/21/13  Yes Burnis Medin, MD  fluticasone (FLONASE) 50 MCG/ACT nasal spray Place 2 sprays into the nose daily.    Historical Provider, MD  HYDROcodone-homatropine (HYCODAN) 5-1.5 MG/5ML syrup Take 5 mLs by mouth every 4 (four) hours  as needed for cough. 06/27/13   Burnis Medin, MD     Positive ROS: All other systems have been reviewed and were otherwise negative with the exception of those mentioned in the HPI and as above.  Physical Exam: General: Alert, no acute distress Cardiovascular: No pedal edema Respiratory: No cyanosis, no use of accessory musculature GI: No organomegaly, abdomen is soft and non-tender Skin: No lesions in the area of chief complaint Neurologic: Sensation intact distally Psychiatric: Patient is competent for consent with normal mood and affect Lymphatic: No axillary or cervical lymphadenopathy  MUSCULOSKELETAL: Right elbow has sensation intact throughout her hand, although the elbow itself has well-healed surgical wounds, range of motion 10 to 130, with positive pain to palpation over the olecranon. Her triceps extension strength is somewhat weak, although still present.  Assessment: Right olecranon nonunion  Plan: Plan for Procedure(s): Revision open reduction internal fixation right olecranon fracture nonunion with autograft bone grafting, taking graft from the left lower extremity. She has a right arthritic knee, and is considering having knee replacement in the upcoming future, and so we will not disturb the right knee/proximal tibia, and rather take graft from the left tibial metaphysis.  The risks benefits and alternatives were discussed with the patient including but not limited to the risks of nonoperative treatment, versus surgical intervention including infection, bleeding, nerve injury, malunion, nonunion, the need for revision surgery, hardware prominence, hardware failure, the need for hardware removal, blood clots, cardiopulmonary complications, morbidity, mortality, among others, and they were willing to proceed.     Johnny Bridge, MD Cell [phone]   04/19/2014 7:30 AM

## 2014-04-19 NOTE — Anesthesia Preprocedure Evaluation (Signed)
Anesthesia Evaluation  Patient identified by MRN, date of birth, ID band Patient awake    Reviewed: Allergy & Precautions, H&P , NPO status , Patient's Chart, lab work & pertinent test results  Airway Mallampati: II  TM Distance: >3 FB Neck ROM: Full    Dental  (+) Teeth Intact, Dental Advisory Given   Pulmonary former smoker,  breath sounds clear to auscultation        Cardiovascular Rhythm:Regular Rate:Normal     Neuro/Psych    GI/Hepatic   Endo/Other    Renal/GU      Musculoskeletal   Abdominal   Peds  Hematology   Anesthesia Other Findings   Reproductive/Obstetrics                             Anesthesia Physical Anesthesia Plan  ASA: II  Anesthesia Plan: General   Post-op Pain Management:    Induction: Intravenous  Airway Management Planned: LMA  Additional Equipment:   Intra-op Plan:   Post-operative Plan:   Informed Consent: I have reviewed the patients History and Physical, chart, labs and discussed the procedure including the risks, benefits and alternatives for the proposed anesthesia with the patient or authorized representative who has indicated his/her understanding and acceptance.   Dental advisory given  Plan Discussed with: CRNA and Anesthesiologist  Anesthesia Plan Comments:         Anesthesia Quick Evaluation

## 2014-04-19 NOTE — Discharge Instructions (Signed)
Diet: As you were doing prior to hospitalization   Shower:  May shower but keep the wounds dry, use an occlusive plastic wrap, NO SOAKING IN TUB.  If the bandage gets wet, change with a clean dry gauze.  Dressing:  You may change your dressing 3-5 days after surgery on your leg.  Then change the dressing daily with sterile gauze dressing.  Keep the splint on the right arm dry.  There are sticky tapes (steri-strips) on your wounds and all the stitches are absorbable.  Leave the steri-strips in place when changing your dressings, they will peel off with time, usually 2-3 weeks.  Activity:  Increase activity slowly as tolerated, but follow the weight bearing instructions below.  No lifting or driving for 6 weeks.  Weight Bearing:   No lifting with right arm..    To prevent constipation: you may use a stool softener such as -  Colace (over the counter) 100 mg by mouth twice a day  Drink plenty of fluids (prune juice may be helpful) and high fiber foods Miralax (over the counter) for constipation as needed.    Itching:  If you experience itching with your medications, try taking only a single pain pill, or even half a pain pill at a time.  You may take up to 10 pain pills per day, and you can also use benadryl over the counter for itching or also to help with sleep.   Precautions:  If you experience chest pain or shortness of breath - call 911 immediately for transfer to the hospital emergency department!!  If you develop a fever greater that 101 F, purulent drainage from wound, increased redness or drainage from wound, or calf pain -- Call the office at [phone]                                                Follow- Up Appointment:  Please call for an appointment to be seen in 2 weeks [place] - [phone]     Regional Anesthesia Blocks  1. Numbness or the inability to move the "blocked" extremity may last from 3-48 hours after placement. The length of time depends on the  medication injected and your individual response to the medication. If the numbness is not going away after 48 hours, call your surgeon.  2. The extremity that is blocked will need to be protected until the numbness is gone and the  Strength has returned. Because you cannot feel it, you will need to take extra care to avoid injury. Because it may be weak, you may have difficulty moving it or using it. You may not know what position it is in without looking at it while the block is in effect.  3. For blocks in the legs and feet, returning to weight bearing and walking needs to be done carefully. You will need to wait until the numbness is entirely gone and the strength has returned. You should be able to move your leg and foot normally before you try and bear weight or walk. You will need someone to be with you when you first try to ensure you do not fall and possibly risk injury.  4. Bruising and tenderness at the needle site are common side effects and will resolve in a few days.  5. Persistent numbness or new problems with movement should be communicated to  the surgeon or the [place] [phone] [place] [phone]).   Post Anesthesia Home Care Instructions  Activity: Get plenty of rest for the remainder of the day. A responsible adult should stay with you for 24 hours following the procedure.  For the next 24 hours, DO NOT: -Drive a car -Paediatric nurse -Drink alcoholic beverages -Take any medication unless instructed by your physician -Make any legal decisions or sign important papers.  Meals: Start with liquid foods such as gelatin or soup. Progress to regular foods as tolerated. Avoid greasy, spicy, heavy foods. If nausea and/or vomiting occur, drink only clear liquids until the nausea and/or vomiting subsides. Call your physician if vomiting continues.  Special Instructions/Symptoms: Your throat may feel dry or sore from the anesthesia or the  breathing tube placed in your throat during surgery. If this causes discomfort, gargle with warm salt water. The discomfort should disappear within 24 hours.

## 2014-04-19 NOTE — Anesthesia Procedure Notes (Addendum)
Anesthesia Regional Block:  Supraclavicular block  Pre-Anesthetic Checklist: ,, timeout performed, Correct Patient, Correct Site, Correct Laterality, Correct Procedure, Correct Position, site marked, Risks and benefits discussed,  Surgical consent,  Pre-op evaluation,  At surgeon's request and post-op pain management  Laterality: Right  Prep: chloraprep       Needles:  Injection technique: Single-shot  Needle Type: Echogenic Stimulator Needle     Needle Length: 9cm 9 cm Needle Gauge: 22 and 22 G    Additional Needles:  Procedures: ultrasound guided (picture in chart) Supraclavicular block Narrative:  Start time: 04/19/2014 7:20 AM End time: 04/19/2014 7:25 AM Injection made incrementally with aspirations every 5 mL.  Performed by: Personally   Additional Notes: 30 cc 0.5% bupivacaine with 1;200 Epi injected easily   Procedure Name: LMA Insertion Date/Time: 04/19/2014 7:39 AM Performed by: Tamsin Nader Pre-anesthesia Checklist: Patient identified, Emergency Drugs available, Suction available and Patient being monitored Patient Re-evaluated:Patient Re-evaluated prior to inductionOxygen Delivery Method: Circle System Utilized Preoxygenation: Pre-oxygenation with 100% oxygen Intubation Type: IV induction Ventilation: Mask ventilation without difficulty LMA: LMA inserted LMA Size: 4.0 Number of attempts: 1 Airway Equipment and Method: bite block Placement Confirmation: positive ETCO2 Tube secured with: Tape Dental Injury: Teeth and Oropharynx as per pre-operative assessment

## 2014-04-19 NOTE — Progress Notes (Signed)
Assisted Dr. Joslin with right, ultrasound guided, supraclavicular block. Side rails up, monitors on throughout procedure. See vital signs in flow sheet. Tolerated Procedure well. 

## 2014-04-19 NOTE — Anesthesia Postprocedure Evaluation (Signed)
  Anesthesia Post-op Note  Patient: Marie Kelly  Procedure(s) Performed: Procedure(s): OPEN REDUCTION INTERNAL FIXATION (ORIF) RIGHT ELBOW/OLECRANON NONUNION FRACTURE WITH HARDWARE REMOVAL (Right) HARVEST LEFT TIBIA BONE GRAFT  (Right)  Patient Location: PACU  Anesthesia Type:General and GA combined with regional for post-op pain  Level of Consciousness: awake, alert  and oriented  Airway and Oxygen Therapy: Patient Spontanous Breathing and Patient connected to nasal cannula oxygen  Post-op Pain: none  Post-op Assessment: Post-op Vital signs reviewed, Patient's Cardiovascular Status Stable, Respiratory Function Stable, Patent Airway, No signs of Nausea or vomiting and Pain level controlled  Post-op Vital Signs: stable  Last Vitals:  Filed Vitals:   04/19/14 1145  BP: 127/62  Pulse: 60  Temp: 36.6 C  Resp: 16    Complications: No apparent anesthesia complications

## 2014-04-19 NOTE — Transfer of Care (Signed)
Immediate Anesthesia Transfer of Care Note  Patient: Marie Kelly  Procedure(s) Performed: Procedure(s): OPEN REDUCTION INTERNAL FIXATION (ORIF) RIGHT ELBOW/OLECRANON NONUNION FRACTURE WITH HARDWARE REMOVAL (Right) HARVEST LEFT TIBIA BONE GRAFT  (Right)  Patient Location: PACU  Anesthesia Type:GA combined with regional for post-op pain  Level of Consciousness: awake, alert , oriented and patient cooperative  Airway & Oxygen Therapy: Patient Spontanous Breathing and Patient connected to face mask oxygen  Post-op Assessment: Report given to PACU RN and Post -op Vital signs reviewed and stable  Post vital signs: Reviewed and stable  Complications: No apparent anesthesia complications

## 2014-04-22 ENCOUNTER — Encounter (HOSPITAL_BASED_OUTPATIENT_CLINIC_OR_DEPARTMENT_OTHER): Payer: Self-pay | Admitting: Orthopedic Surgery

## 2014-04-22 LAB — TISSUE CULTURE: CULTURE: NO GROWTH

## 2014-04-22 NOTE — Progress Notes (Signed)
Anesthesiology Follow-up:  Patient underwent ORIF of L. Elbow fracture/nonunion on 12/4. Post-operatively in PACU she complained of bilateral eye pain and scratchy sensation. Mild bilateral conjunctival erythema noted on exam. No visual impairment. She was given a prescription for ketorolac eye drops for presumed bilateral  corneal abrasions.   On follow-up phone call today, she says her eye discomfort and redness have resolved and her vision is fine. She expressed no further concerns.              Marie Kelly

## 2014-04-22 NOTE — Addendum Note (Signed)
Addendum  created 04/22/14 0936 by Roberts Gaudy, MD   Modules edited: Clinical Notes   Clinical Notes:  File: [id number]; [place]: [id number]

## 2014-04-24 LAB — ANAEROBIC CULTURE

## 2014-04-26 ENCOUNTER — Encounter: Payer: Medicare Other | Admitting: Internal Medicine

## 2014-04-29 ENCOUNTER — Encounter: Payer: Medicare Other | Admitting: Internal Medicine

## 2014-05-01 DIAGNOSIS — S52001D Unspecified fracture of upper end of right ulna, subsequent encounter for closed fracture with routine healing: Secondary | ICD-10-CM | POA: Diagnosis not present

## 2014-05-01 NOTE — Addendum Note (Signed)
Addendum  created 05/01/14 1239 by Roberts Gaudy, MD   Modules edited: Anesthesia Attestations

## 2014-05-15 DIAGNOSIS — S52001D Unspecified fracture of upper end of right ulna, subsequent encounter for closed fracture with routine healing: Secondary | ICD-10-CM | POA: Diagnosis not present

## 2014-05-22 DIAGNOSIS — H401232 Low-tension glaucoma, bilateral, moderate stage: Secondary | ICD-10-CM | POA: Diagnosis not present

## 2014-05-30 DIAGNOSIS — H35341 Macular cyst, hole, or pseudohole, right eye: Secondary | ICD-10-CM | POA: Diagnosis not present

## 2014-05-30 DIAGNOSIS — H43813 Vitreous degeneration, bilateral: Secondary | ICD-10-CM | POA: Diagnosis not present

## 2014-05-30 DIAGNOSIS — H35371 Puckering of macula, right eye: Secondary | ICD-10-CM | POA: Diagnosis not present

## 2014-05-30 DIAGNOSIS — H401232 Low-tension glaucoma, bilateral, moderate stage: Secondary | ICD-10-CM | POA: Diagnosis not present

## 2014-06-11 DIAGNOSIS — H35371 Puckering of macula, right eye: Secondary | ICD-10-CM | POA: Diagnosis not present

## 2014-06-11 DIAGNOSIS — H43813 Vitreous degeneration, bilateral: Secondary | ICD-10-CM | POA: Diagnosis not present

## 2014-06-11 DIAGNOSIS — H35341 Macular cyst, hole, or pseudohole, right eye: Secondary | ICD-10-CM | POA: Diagnosis not present

## 2014-06-12 ENCOUNTER — Encounter: Payer: Medicare Other | Admitting: Internal Medicine

## 2014-06-12 DIAGNOSIS — S52001D Unspecified fracture of upper end of right ulna, subsequent encounter for closed fracture with routine healing: Secondary | ICD-10-CM | POA: Diagnosis not present

## 2014-06-12 DIAGNOSIS — M1711 Unilateral primary osteoarthritis, right knee: Secondary | ICD-10-CM | POA: Diagnosis not present

## 2014-06-12 DIAGNOSIS — T84498A Other mechanical complication of other internal orthopedic devices, implants and grafts, initial encounter: Secondary | ICD-10-CM | POA: Diagnosis not present

## 2014-06-12 DIAGNOSIS — S52031D Displaced fracture of olecranon process with intraarticular extension of right ulna, subsequent encounter for closed fracture with routine healing: Secondary | ICD-10-CM | POA: Diagnosis not present

## 2014-06-21 DIAGNOSIS — L814 Other melanin hyperpigmentation: Secondary | ICD-10-CM | POA: Diagnosis not present

## 2014-06-21 DIAGNOSIS — D225 Melanocytic nevi of trunk: Secondary | ICD-10-CM | POA: Diagnosis not present

## 2014-06-21 DIAGNOSIS — L821 Other seborrheic keratosis: Secondary | ICD-10-CM | POA: Diagnosis not present

## 2014-06-21 DIAGNOSIS — D1801 Hemangioma of skin and subcutaneous tissue: Secondary | ICD-10-CM | POA: Diagnosis not present

## 2014-06-24 DIAGNOSIS — M6281 Muscle weakness (generalized): Secondary | ICD-10-CM | POA: Diagnosis not present

## 2014-06-24 DIAGNOSIS — S52031P Displaced fracture of olecranon process with intraarticular extension of right ulna, subsequent encounter for closed fracture with malunion: Secondary | ICD-10-CM | POA: Diagnosis not present

## 2014-06-24 DIAGNOSIS — M25621 Stiffness of right elbow, not elsewhere classified: Secondary | ICD-10-CM | POA: Diagnosis not present

## 2014-06-24 DIAGNOSIS — M25521 Pain in right elbow: Secondary | ICD-10-CM | POA: Diagnosis not present

## 2014-06-25 ENCOUNTER — Telehealth: Payer: Self-pay

## 2014-06-25 DIAGNOSIS — M9904 Segmental and somatic dysfunction of sacral region: Secondary | ICD-10-CM | POA: Diagnosis not present

## 2014-06-25 DIAGNOSIS — M47817 Spondylosis without myelopathy or radiculopathy, lumbosacral region: Secondary | ICD-10-CM | POA: Diagnosis not present

## 2014-06-25 DIAGNOSIS — M545 Low back pain: Secondary | ICD-10-CM | POA: Diagnosis not present

## 2014-06-25 DIAGNOSIS — M9903 Segmental and somatic dysfunction of lumbar region: Secondary | ICD-10-CM | POA: Diagnosis not present

## 2014-06-25 DIAGNOSIS — M9905 Segmental and somatic dysfunction of pelvic region: Secondary | ICD-10-CM | POA: Diagnosis not present

## 2014-06-25 DIAGNOSIS — M791 Myalgia: Secondary | ICD-10-CM | POA: Diagnosis not present

## 2014-06-25 NOTE — Telephone Encounter (Signed)
Will wait to receive office notes.

## 2014-06-25 NOTE — Telephone Encounter (Signed)
Office note received and placed on WP's desk.

## 2014-06-25 NOTE — Telephone Encounter (Signed)
Pt was seen by [facility] this morning and she needs a lumbar spine x-ray.  Pt has Medicare and BCBS secondary and with Medicare they will not pay if the chiropractor orders an xray.  The patients records will be faxed over.  Junie Panning would like to know if [name] will review the records and order the x-ray or if pt needs to be seen in the office and then the x-ray will be ordered.  Please advise and call Erin.

## 2014-06-26 NOTE — Telephone Encounter (Signed)
I don't  order x rays for other providers.  Can schedule OV to evaluate for x ray s. Marland Kitchen [facility] be aware  As they initially called .   I

## 2014-06-26 NOTE — Telephone Encounter (Signed)
Pt following up on request for an xray, pt having significant back pain and anxious to have this done.

## 2014-07-01 ENCOUNTER — Ambulatory Visit: Payer: Medicare Other | Admitting: Internal Medicine

## 2014-07-01 NOTE — Telephone Encounter (Signed)
Pt aware and appt scheduled.  

## 2014-07-02 DIAGNOSIS — S52031P Displaced fracture of olecranon process with intraarticular extension of right ulna, subsequent encounter for closed fracture with malunion: Secondary | ICD-10-CM | POA: Diagnosis not present

## 2014-07-02 DIAGNOSIS — M6281 Muscle weakness (generalized): Secondary | ICD-10-CM | POA: Diagnosis not present

## 2014-07-02 DIAGNOSIS — M25621 Stiffness of right elbow, not elsewhere classified: Secondary | ICD-10-CM | POA: Diagnosis not present

## 2014-07-02 DIAGNOSIS — M25521 Pain in right elbow: Secondary | ICD-10-CM | POA: Diagnosis not present

## 2014-07-03 ENCOUNTER — Ambulatory Visit (INDEPENDENT_AMBULATORY_CARE_PROVIDER_SITE_OTHER)
Admission: RE | Admit: 2014-07-03 | Discharge: 2014-07-03 | Disposition: A | Discharge status: Home / Self Care | Payer: Medicare Other | Source: Ambulatory Visit | Attending: Internal Medicine | Admitting: Internal Medicine

## 2014-07-03 ENCOUNTER — Encounter: Payer: Self-pay | Admitting: Internal Medicine

## 2014-07-03 ENCOUNTER — Ambulatory Visit (INDEPENDENT_AMBULATORY_CARE_PROVIDER_SITE_OTHER): Payer: Medicare Other | Admitting: Internal Medicine

## 2014-07-03 VITALS — BP 140/90 | Temp 98.0°F | Ht 63.5 in | Wt 202.6 lb

## 2014-07-03 DIAGNOSIS — M545 Low back pain, unspecified: Secondary | ICD-10-CM

## 2014-07-03 DIAGNOSIS — M5137 Other intervertebral disc degeneration, lumbosacral region: Secondary | ICD-10-CM | POA: Diagnosis not present

## 2014-07-03 DIAGNOSIS — M5136 Other intervertebral disc degeneration, lumbar region: Secondary | ICD-10-CM | POA: Diagnosis not present

## 2014-07-03 NOTE — Patient Instructions (Signed)
X ray  Back  probably have arthritis.Marie KitchenMarland Kelly    I would advise  Some PT ot regarding gait knee back conditioning to prevent further ms problems  Will send result to dr Thea Alken   Office and  [facility] when back.

## 2014-07-03 NOTE — Progress Notes (Signed)
Pre visit review using our clinic review tool, if applicable. No additional management support is needed unless otherwise documented below in the visit note.  Chief Complaint  Patient presents with  . Follow-up    HPI: Marie Kelly 69 y.o. has been seeing chiro for back pain and he advised getting xray . Since medicare not paying for  No hx of back problems before but now having continuous low back pain worse with standing and walking  No radiation weakness but has had a number of ortho issues recently and has been taking care of GC with lifting   Gets sharp severe pain taking   musc relax and pain pill form surgery at times  Walking off kilter  Knee cortisone  X 3  December . Dr Mardelle Matte  Advised partial kene relaplcement.  To be done in MAY.  Second surgery on arm  In December and beginning PT  Lifting   30 pound 2.5 age. Pain putting on knee brace.   No weakness no fever.  Elbow fracture June 15 and surgery then resurgery  dec 4th  Pin removal  ROS: See pertinent positives and negatives per HPI.  Past Medical History  Diagnosis Date  . Colon polyps   . GERD (gastroesophageal reflux disease)     had endo? taking nexium 3 x per week  . Diverticulosis   . Hyperlipidemia     had been on lipitor changes to pravachol cause of cost and tricor has since stopped  NO  se reported   . Anemia   . Impaired fasting glucose   . Hypothyroidism      on meds for years  . Asthma   . Olecranon fracture 10/25/2013    right arm  . Ventricular bigeminy seen on cardiac monitor     Cardiac work up Dr Stanford Breed [facility]  . Fracture of right olecranon process 10/26/2013  . Wears glasses   . Wears dentures     top-partial bottom  . Complication of anesthesia     2 days after surgery-muscles hurtand spasmed-needed a muscle relaxant   . 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 Stroke Mother   . Diabetes  Father   . Alzheimer's disease    . Stomach cancer Maternal Aunt   . Colon cancer Neg Hx   . Pancreatic cancer Neg Hx   . Heart disease Father     History   Social History  . Marital Status: Married    Spouse Name: N/A  . Number of Children: 3  . Years of Education: N/A   Social History Main Topics  . Smoking status: Former Smoker    Quit date: 10/25/1973  . Smokeless tobacco: Never Used  . Alcohol Use: Yes     Comment: socially  . Drug Use: No  . Sexual Activity: Not on file   Other Topics Concern  . 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    Gravida 3 para 3   Last td 6 2008   HS educated       REmote hx of tobacco 1.5 ppd for 6 years as a teen Q 17  Outpatient Encounter Prescriptions as of 07/03/2014  Medication Sig  . baclofen (LIORESAL) 10 MG tablet Take 1 tablet (10 mg total) by mouth 3 (three) times daily. As needed for muscle spasm  . fluticasone (FLONASE) 50 MCG/ACT nasal spray Place 2 sprays into the nose daily.  . Fluticasone-Salmeterol (ADVAIR) 250-50 MCG/DOSE AEPB Inhale 1 puff into the lungs every 12 (twelve) hours.  Marland Kitchen HYDROcodone-homatropine (HYCODAN) 5-1.5 MG/5ML syrup Take 5 mLs by mouth every 4 (four) hours as needed for cough.  . latanoprost (XALATAN) 0.005 % ophthalmic solution 1 drop at bedtime.  Marland Kitchen levothyroxine (SYNTHROID, LEVOTHROID) 50 MCG tablet TAKE 1 TABLET DAILY  . montelukast (SINGULAIR) 10 MG tablet TAKE 1 TABLET AT BEDTIME  . oxyCODONE-acetaminophen (PERCOCET) 10-325 MG per tablet Take 1-2 tablets by mouth every 6 (six) hours as needed for pain. MAXIMUM TOTAL ACETAMINOPHEN DOSE IS 4000 MG PER DAY  . tolterodine (DETROL LA) 2 MG 24 hr capsule Take 1 capsule (2 mg total) by mouth daily.  . [DISCONTINUED] esomeprazole (NEXIUM) 40 MG capsule Take 40 mg by mouth as needed.  .  [DISCONTINUED] ondansetron (ZOFRAN) 4 MG tablet Take 1 tablet (4 mg total) by mouth every 8 (eight) hours as needed for nausea or vomiting.  . [DISCONTINUED] sennosides-docusate sodium (SENOKOT-S) 8.6-50 MG tablet Take 2 tablets by mouth daily.  . [DISCONTINUED] sennosides-docusate sodium (SENOKOT-S) 8.6-50 MG tablet Take 2 tablets by mouth daily.    EXAM:  BP 140/90 mmHg  Temp(Src) 98 F (36.7 C) (Oral)  Ht 5' 3.5" (1.613 m)  Wt 202 lb 9.6 oz (91.899 kg)  BMI 35.32 kg/m2  Body mass index is 35.32 kg/(m^2).  GENERAL: vitals reviewed and listed above, alert, oriented, appears well hydrated and in no acute distress limp  HEENT: atraumatic, conjunctiva  clear, no obvious abnormalities on inspection of external nose and ears OP : no lesion edema or exudate   dec rom right elbow but uses well  gaoit slighlty antalgic  Nl motor grossly  Midline back tenderness sacral lumbar area PSYCH: pleasant and cooperative, no obvious depression or anxiety reveiwed  Information from chirp ASSESSMENT AND PLAN:  Discussed the following assessment and plan:  Midline low back pain without sciatica - Plan: DG Lumbar Spine Complete  Low back pain, unspecified back pain laterality, with sciatica presence unspecified - Plan: DG Lumbar Spine Complete Multiple ortho challenges recently  Arm knee and now back could be compensatory and may benefit from  Pt OT conditioning as well as   chiro if she wishes for pain relief.  ls spine x ray  Because of  Age hx and persistent quality  -Patient advised to return or notify health care team  if symptoms worsen ,persist or new concerns arise.  Patient Instructions  X ray  Back  probably have arthritis.Marland KitchenMarland Kitchen    I would advise  Some PT ot regarding gait knee back conditioning to prevent further ms problems  Will send result to dr Thea Alken   Office and  [facility] when back.     Standley Brooking. Kianna Billet M.D.

## 2014-07-05 DIAGNOSIS — M9905 Segmental and somatic dysfunction of pelvic region: Secondary | ICD-10-CM | POA: Diagnosis not present

## 2014-07-05 DIAGNOSIS — S52031P Displaced fracture of olecranon process with intraarticular extension of right ulna, subsequent encounter for closed fracture with malunion: Secondary | ICD-10-CM | POA: Diagnosis not present

## 2014-07-05 DIAGNOSIS — M6281 Muscle weakness (generalized): Secondary | ICD-10-CM | POA: Diagnosis not present

## 2014-07-05 DIAGNOSIS — M9903 Segmental and somatic dysfunction of lumbar region: Secondary | ICD-10-CM | POA: Diagnosis not present

## 2014-07-05 DIAGNOSIS — M47817 Spondylosis without myelopathy or radiculopathy, lumbosacral region: Secondary | ICD-10-CM | POA: Diagnosis not present

## 2014-07-05 DIAGNOSIS — M25521 Pain in right elbow: Secondary | ICD-10-CM | POA: Diagnosis not present

## 2014-07-05 DIAGNOSIS — M791 Myalgia: Secondary | ICD-10-CM | POA: Diagnosis not present

## 2014-07-05 DIAGNOSIS — M9904 Segmental and somatic dysfunction of sacral region: Secondary | ICD-10-CM | POA: Diagnosis not present

## 2014-07-05 DIAGNOSIS — M25621 Stiffness of right elbow, not elsewhere classified: Secondary | ICD-10-CM | POA: Diagnosis not present

## 2014-07-05 DIAGNOSIS — M545 Low back pain: Secondary | ICD-10-CM | POA: Diagnosis not present

## 2014-07-08 DIAGNOSIS — M545 Low back pain: Secondary | ICD-10-CM | POA: Diagnosis not present

## 2014-07-08 DIAGNOSIS — M791 Myalgia: Secondary | ICD-10-CM | POA: Diagnosis not present

## 2014-07-08 DIAGNOSIS — M9904 Segmental and somatic dysfunction of sacral region: Secondary | ICD-10-CM | POA: Diagnosis not present

## 2014-07-08 DIAGNOSIS — M9905 Segmental and somatic dysfunction of pelvic region: Secondary | ICD-10-CM | POA: Diagnosis not present

## 2014-07-08 DIAGNOSIS — M47817 Spondylosis without myelopathy or radiculopathy, lumbosacral region: Secondary | ICD-10-CM | POA: Diagnosis not present

## 2014-07-08 DIAGNOSIS — M9903 Segmental and somatic dysfunction of lumbar region: Secondary | ICD-10-CM | POA: Diagnosis not present

## 2014-07-09 DIAGNOSIS — M25521 Pain in right elbow: Secondary | ICD-10-CM | POA: Diagnosis not present

## 2014-07-09 DIAGNOSIS — M25621 Stiffness of right elbow, not elsewhere classified: Secondary | ICD-10-CM | POA: Diagnosis not present

## 2014-07-09 DIAGNOSIS — M6281 Muscle weakness (generalized): Secondary | ICD-10-CM | POA: Diagnosis not present

## 2014-07-09 DIAGNOSIS — S52031P Displaced fracture of olecranon process with intraarticular extension of right ulna, subsequent encounter for closed fracture with malunion: Secondary | ICD-10-CM | POA: Diagnosis not present

## 2014-07-12 DIAGNOSIS — M6281 Muscle weakness (generalized): Secondary | ICD-10-CM | POA: Diagnosis not present

## 2014-07-12 DIAGNOSIS — M545 Low back pain: Secondary | ICD-10-CM | POA: Diagnosis not present

## 2014-07-12 DIAGNOSIS — M9904 Segmental and somatic dysfunction of sacral region: Secondary | ICD-10-CM | POA: Diagnosis not present

## 2014-07-12 DIAGNOSIS — S52031P Displaced fracture of olecranon process with intraarticular extension of right ulna, subsequent encounter for closed fracture with malunion: Secondary | ICD-10-CM | POA: Diagnosis not present

## 2014-07-12 DIAGNOSIS — M25521 Pain in right elbow: Secondary | ICD-10-CM | POA: Diagnosis not present

## 2014-07-12 DIAGNOSIS — M25621 Stiffness of right elbow, not elsewhere classified: Secondary | ICD-10-CM | POA: Diagnosis not present

## 2014-07-12 DIAGNOSIS — M9903 Segmental and somatic dysfunction of lumbar region: Secondary | ICD-10-CM | POA: Diagnosis not present

## 2014-07-12 DIAGNOSIS — M791 Myalgia: Secondary | ICD-10-CM | POA: Diagnosis not present

## 2014-07-12 DIAGNOSIS — M47817 Spondylosis without myelopathy or radiculopathy, lumbosacral region: Secondary | ICD-10-CM | POA: Diagnosis not present

## 2014-07-12 DIAGNOSIS — M9905 Segmental and somatic dysfunction of pelvic region: Secondary | ICD-10-CM | POA: Diagnosis not present

## 2014-07-16 DIAGNOSIS — M25621 Stiffness of right elbow, not elsewhere classified: Secondary | ICD-10-CM | POA: Diagnosis not present

## 2014-07-16 DIAGNOSIS — S52031P Displaced fracture of olecranon process with intraarticular extension of right ulna, subsequent encounter for closed fracture with malunion: Secondary | ICD-10-CM | POA: Diagnosis not present

## 2014-07-16 DIAGNOSIS — M25521 Pain in right elbow: Secondary | ICD-10-CM | POA: Diagnosis not present

## 2014-07-16 DIAGNOSIS — M6281 Muscle weakness (generalized): Secondary | ICD-10-CM | POA: Diagnosis not present

## 2014-07-17 DIAGNOSIS — S52031D Displaced fracture of olecranon process with intraarticular extension of right ulna, subsequent encounter for closed fracture with routine healing: Secondary | ICD-10-CM | POA: Diagnosis not present

## 2014-07-17 DIAGNOSIS — M1711 Unilateral primary osteoarthritis, right knee: Secondary | ICD-10-CM | POA: Diagnosis not present

## 2014-07-19 DIAGNOSIS — S52031P Displaced fracture of olecranon process with intraarticular extension of right ulna, subsequent encounter for closed fracture with malunion: Secondary | ICD-10-CM | POA: Diagnosis not present

## 2014-07-19 DIAGNOSIS — M25621 Stiffness of right elbow, not elsewhere classified: Secondary | ICD-10-CM | POA: Diagnosis not present

## 2014-07-19 DIAGNOSIS — M6281 Muscle weakness (generalized): Secondary | ICD-10-CM | POA: Diagnosis not present

## 2014-07-19 DIAGNOSIS — M25521 Pain in right elbow: Secondary | ICD-10-CM | POA: Diagnosis not present

## 2014-07-23 DIAGNOSIS — M25621 Stiffness of right elbow, not elsewhere classified: Secondary | ICD-10-CM | POA: Diagnosis not present

## 2014-07-23 DIAGNOSIS — M25521 Pain in right elbow: Secondary | ICD-10-CM | POA: Diagnosis not present

## 2014-07-23 DIAGNOSIS — M6281 Muscle weakness (generalized): Secondary | ICD-10-CM | POA: Diagnosis not present

## 2014-07-23 DIAGNOSIS — S52031P Displaced fracture of olecranon process with intraarticular extension of right ulna, subsequent encounter for closed fracture with malunion: Secondary | ICD-10-CM | POA: Diagnosis not present

## 2014-07-26 DIAGNOSIS — M6281 Muscle weakness (generalized): Secondary | ICD-10-CM | POA: Diagnosis not present

## 2014-07-26 DIAGNOSIS — M545 Low back pain: Secondary | ICD-10-CM | POA: Diagnosis not present

## 2014-07-26 DIAGNOSIS — M791 Myalgia: Secondary | ICD-10-CM | POA: Diagnosis not present

## 2014-07-26 DIAGNOSIS — S52031P Displaced fracture of olecranon process with intraarticular extension of right ulna, subsequent encounter for closed fracture with malunion: Secondary | ICD-10-CM | POA: Diagnosis not present

## 2014-07-26 DIAGNOSIS — M25521 Pain in right elbow: Secondary | ICD-10-CM | POA: Diagnosis not present

## 2014-07-26 DIAGNOSIS — M9903 Segmental and somatic dysfunction of lumbar region: Secondary | ICD-10-CM | POA: Diagnosis not present

## 2014-07-26 DIAGNOSIS — M47817 Spondylosis without myelopathy or radiculopathy, lumbosacral region: Secondary | ICD-10-CM | POA: Diagnosis not present

## 2014-07-26 DIAGNOSIS — M9904 Segmental and somatic dysfunction of sacral region: Secondary | ICD-10-CM | POA: Diagnosis not present

## 2014-07-26 DIAGNOSIS — M25621 Stiffness of right elbow, not elsewhere classified: Secondary | ICD-10-CM | POA: Diagnosis not present

## 2014-07-26 DIAGNOSIS — M9905 Segmental and somatic dysfunction of pelvic region: Secondary | ICD-10-CM | POA: Diagnosis not present

## 2014-07-30 DIAGNOSIS — S52031P Displaced fracture of olecranon process with intraarticular extension of right ulna, subsequent encounter for closed fracture with malunion: Secondary | ICD-10-CM | POA: Diagnosis not present

## 2014-07-30 DIAGNOSIS — M25621 Stiffness of right elbow, not elsewhere classified: Secondary | ICD-10-CM | POA: Diagnosis not present

## 2014-07-30 DIAGNOSIS — M6281 Muscle weakness (generalized): Secondary | ICD-10-CM | POA: Diagnosis not present

## 2014-07-30 DIAGNOSIS — M25521 Pain in right elbow: Secondary | ICD-10-CM | POA: Diagnosis not present

## 2014-07-31 DIAGNOSIS — M6281 Muscle weakness (generalized): Secondary | ICD-10-CM | POA: Diagnosis not present

## 2014-07-31 DIAGNOSIS — M25621 Stiffness of right elbow, not elsewhere classified: Secondary | ICD-10-CM | POA: Diagnosis not present

## 2014-07-31 DIAGNOSIS — M25521 Pain in right elbow: Secondary | ICD-10-CM | POA: Diagnosis not present

## 2014-07-31 DIAGNOSIS — S52031P Displaced fracture of olecranon process with intraarticular extension of right ulna, subsequent encounter for closed fracture with malunion: Secondary | ICD-10-CM | POA: Diagnosis not present

## 2014-08-08 DIAGNOSIS — S52031P Displaced fracture of olecranon process with intraarticular extension of right ulna, subsequent encounter for closed fracture with malunion: Secondary | ICD-10-CM | POA: Diagnosis not present

## 2014-08-08 DIAGNOSIS — M6281 Muscle weakness (generalized): Secondary | ICD-10-CM | POA: Diagnosis not present

## 2014-08-08 DIAGNOSIS — M25621 Stiffness of right elbow, not elsewhere classified: Secondary | ICD-10-CM | POA: Diagnosis not present

## 2014-08-08 DIAGNOSIS — M25521 Pain in right elbow: Secondary | ICD-10-CM | POA: Diagnosis not present

## 2014-08-09 DIAGNOSIS — M9905 Segmental and somatic dysfunction of pelvic region: Secondary | ICD-10-CM | POA: Diagnosis not present

## 2014-08-09 DIAGNOSIS — M791 Myalgia: Secondary | ICD-10-CM | POA: Diagnosis not present

## 2014-08-09 DIAGNOSIS — M545 Low back pain: Secondary | ICD-10-CM | POA: Diagnosis not present

## 2014-08-09 DIAGNOSIS — M9903 Segmental and somatic dysfunction of lumbar region: Secondary | ICD-10-CM | POA: Diagnosis not present

## 2014-08-09 DIAGNOSIS — M47817 Spondylosis without myelopathy or radiculopathy, lumbosacral region: Secondary | ICD-10-CM | POA: Diagnosis not present

## 2014-08-09 DIAGNOSIS — M9904 Segmental and somatic dysfunction of sacral region: Secondary | ICD-10-CM | POA: Diagnosis not present

## 2014-08-15 ENCOUNTER — Telehealth: Payer: Self-pay | Admitting: *Deleted

## 2014-08-15 NOTE — Telephone Encounter (Signed)
Rx request for Tolterodine 2 mg caps Qty: 30. Patient requesting rx asap, she is going out of town.

## 2014-08-16 MED ORDER — TOLTERODINE TARTRATE ER 2 MG PO CP24: 2.0000 mg | ORAL_CAPSULE | Freq: Every day | ORAL | Status: DC

## 2014-08-16 NOTE — Telephone Encounter (Signed)
Sent to the pharmacy by e-scribe.  Pt has upcoming wellness exam on 09/18/14

## 2014-08-19 DIAGNOSIS — M25521 Pain in right elbow: Secondary | ICD-10-CM | POA: Diagnosis not present

## 2014-08-22 DIAGNOSIS — M791 Myalgia: Secondary | ICD-10-CM | POA: Diagnosis not present

## 2014-08-22 DIAGNOSIS — M9905 Segmental and somatic dysfunction of pelvic region: Secondary | ICD-10-CM | POA: Diagnosis not present

## 2014-08-22 DIAGNOSIS — M47817 Spondylosis without myelopathy or radiculopathy, lumbosacral region: Secondary | ICD-10-CM | POA: Diagnosis not present

## 2014-08-22 DIAGNOSIS — M545 Low back pain: Secondary | ICD-10-CM | POA: Diagnosis not present

## 2014-08-22 DIAGNOSIS — M9904 Segmental and somatic dysfunction of sacral region: Secondary | ICD-10-CM | POA: Diagnosis not present

## 2014-08-22 DIAGNOSIS — M9903 Segmental and somatic dysfunction of lumbar region: Secondary | ICD-10-CM | POA: Diagnosis not present

## 2014-09-05 DIAGNOSIS — M545 Low back pain: Secondary | ICD-10-CM | POA: Diagnosis not present

## 2014-09-05 DIAGNOSIS — M9905 Segmental and somatic dysfunction of pelvic region: Secondary | ICD-10-CM | POA: Diagnosis not present

## 2014-09-05 DIAGNOSIS — M9903 Segmental and somatic dysfunction of lumbar region: Secondary | ICD-10-CM | POA: Diagnosis not present

## 2014-09-05 DIAGNOSIS — M47817 Spondylosis without myelopathy or radiculopathy, lumbosacral region: Secondary | ICD-10-CM | POA: Diagnosis not present

## 2014-09-05 DIAGNOSIS — M791 Myalgia: Secondary | ICD-10-CM | POA: Diagnosis not present

## 2014-09-05 DIAGNOSIS — M9904 Segmental and somatic dysfunction of sacral region: Secondary | ICD-10-CM | POA: Diagnosis not present

## 2014-09-18 ENCOUNTER — Ambulatory Visit (INDEPENDENT_AMBULATORY_CARE_PROVIDER_SITE_OTHER): Payer: Medicare Other | Admitting: Internal Medicine

## 2014-09-18 VITALS — BP 150/76 | Temp 98.7°F | Ht 63.0 in | Wt 201.9 lb

## 2014-09-18 DIAGNOSIS — R7989 Other specified abnormal findings of blood chemistry: Secondary | ICD-10-CM | POA: Diagnosis not present

## 2014-09-18 DIAGNOSIS — I499 Cardiac arrhythmia, unspecified: Secondary | ICD-10-CM

## 2014-09-18 DIAGNOSIS — I493 Ventricular premature depolarization: Secondary | ICD-10-CM

## 2014-09-18 DIAGNOSIS — N3281 Overactive bladder: Secondary | ICD-10-CM

## 2014-09-18 DIAGNOSIS — E785 Hyperlipidemia, unspecified: Secondary | ICD-10-CM

## 2014-09-18 DIAGNOSIS — Z2089 Contact with and (suspected) exposure to other communicable diseases: Secondary | ICD-10-CM

## 2014-09-18 DIAGNOSIS — Z23 Encounter for immunization: Secondary | ICD-10-CM | POA: Diagnosis not present

## 2014-09-18 DIAGNOSIS — J029 Acute pharyngitis, unspecified: Secondary | ICD-10-CM | POA: Diagnosis not present

## 2014-09-18 DIAGNOSIS — R7301 Impaired fasting glucose: Secondary | ICD-10-CM | POA: Diagnosis not present

## 2014-09-18 DIAGNOSIS — E2839 Other primary ovarian failure: Secondary | ICD-10-CM

## 2014-09-18 DIAGNOSIS — M129 Arthropathy, unspecified: Secondary | ICD-10-CM

## 2014-09-18 DIAGNOSIS — Z20818 Contact with and (suspected) exposure to other bacterial communicable diseases: Secondary | ICD-10-CM

## 2014-09-18 DIAGNOSIS — R945 Abnormal results of liver function studies: Secondary | ICD-10-CM

## 2014-09-18 DIAGNOSIS — R030 Elevated blood-pressure reading, without diagnosis of hypertension: Secondary | ICD-10-CM

## 2014-09-18 DIAGNOSIS — E039 Hypothyroidism, unspecified: Secondary | ICD-10-CM

## 2014-09-18 DIAGNOSIS — Z0000 Encounter for general adult medical examination without abnormal findings: Secondary | ICD-10-CM | POA: Diagnosis not present

## 2014-09-18 DIAGNOSIS — R799 Abnormal finding of blood chemistry, unspecified: Secondary | ICD-10-CM | POA: Diagnosis not present

## 2014-09-18 DIAGNOSIS — M1711 Unilateral primary osteoarthritis, right knee: Secondary | ICD-10-CM

## 2014-09-18 DIAGNOSIS — IMO0001 Reserved for inherently not codable concepts without codable children: Secondary | ICD-10-CM

## 2014-09-18 LAB — LIPID PANEL
CHOL/HDL RATIO: 5
Cholesterol: 242 mg/dL — ABNORMAL HIGH (ref 0–200)
HDL: 45.5 mg/dL (ref >39.00)
LDL Cholesterol: 165 mg/dL — ABNORMAL HIGH (ref 0–99)
NonHDL: 196.5
Triglycerides: 158 mg/dL — ABNORMAL HIGH (ref 0.0–149.0)
VLDL: 31.6 mg/dL (ref 0.0–40.0)

## 2014-09-18 LAB — CBC WITH DIFFERENTIAL/PLATELET
BASOS ABS: 0 10*3/uL (ref 0.0–0.1)
Basophils Relative: 0.3 % (ref 0.0–3.0)
Eosinophils Absolute: 0.2 10*3/uL (ref 0.0–0.7)
Eosinophils Relative: 2 % (ref 0.0–5.0)
HEMATOCRIT: 40.1 % (ref 36.0–46.0)
HEMOGLOBIN: 13.5 g/dL (ref 12.0–15.0)
LYMPHS ABS: 1 10*3/uL (ref 0.7–4.0)
Lymphocytes Relative: 13 % (ref 12.0–46.0)
MCHC: 33.6 g/dL (ref 30.0–36.0)
MCV: 84.8 fl (ref 78.0–100.0)
MONO ABS: 0.9 10*3/uL (ref 0.1–1.0)
MONOS PCT: 11.4 % (ref 3.0–12.0)
NEUTROS ABS: 5.9 10*3/uL (ref 1.4–7.7)
Neutrophils Relative %: 73.3 % (ref 43.0–77.0)
PLATELETS: 202 10*3/uL (ref 150.0–400.0)
RBC: 4.73 Mil/uL (ref 3.87–5.11)
RDW: 14.6 % (ref 11.5–15.5)
WBC: 8.1 10*3/uL (ref 4.0–10.5)

## 2014-09-18 LAB — BASIC METABOLIC PANEL
BUN: 15 mg/dL (ref 6–23)
CO2: 27 meq/L (ref 19–32)
CREATININE: 0.91 mg/dL (ref 0.40–1.20)
Calcium: 9.5 mg/dL (ref 8.4–10.5)
Chloride: 102 mEq/L (ref 96–112)
GFR: 65.07 mL/min (ref >60.00)
Glucose, Bld: 93 mg/dL (ref 70–99)
Potassium: 4.1 mEq/L (ref 3.5–5.1)
Sodium: 138 mEq/L (ref 135–145)

## 2014-09-18 LAB — HEPATIC FUNCTION PANEL
ALK PHOS: 48 U/L (ref 39–117)
ALT: 30 U/L (ref 0–35)
AST: 21 U/L (ref 0–37)
Albumin: 4.2 g/dL (ref 3.5–5.2)
BILIRUBIN DIRECT: 0.2 mg/dL (ref 0.0–0.3)
Total Bilirubin: 1 mg/dL (ref 0.2–1.2)
Total Protein: 7.4 g/dL (ref 6.0–8.3)

## 2014-09-18 LAB — POCT URINALYSIS DIP (MANUAL ENTRY)
BILIRUBIN UA: NEGATIVE
Glucose, UA: NEGATIVE
Ketones, POC UA: NEGATIVE
NITRITE UA: NEGATIVE
PH UA: 6.5
Protein Ur, POC: 100
RBC UA: NEGATIVE
Spec Grav, UA: 1.015
UROBILINOGEN UA: 0.2

## 2014-09-18 LAB — TSH: TSH: 3.4 u[IU]/mL (ref 0.35–4.50)

## 2014-09-18 LAB — HEMOGLOBIN A1C: Hgb A1c MFr Bld: 5.7 % (ref 4.6–6.5)

## 2014-09-18 MED ORDER — LEVOTHYROXINE SODIUM 50 MCG PO TABS: 50.0000 ug | ORAL_TABLET | Freq: Every day | ORAL | Status: DC

## 2014-09-18 MED ORDER — TOLTERODINE TARTRATE ER 2 MG PO CP24: 2.0000 mg | ORAL_CAPSULE | Freq: Every day | ORAL | Status: DC

## 2014-09-18 MED ORDER — MONTELUKAST SODIUM 10 MG PO TABS: 10.0000 mg | ORAL_TABLET | Freq: Every day | ORAL | Status: DC

## 2014-09-18 NOTE — Progress Notes (Signed)
Pre visit review using our clinic review tool, if applicable. No additional management support is needed unless otherwise documented below in the visit note.  Chief Complaint  Patient presents with  . Medicare Wellness    has st and ongestion    HPI: Marie Kelly 69 y.o. comes in today for Preventive Medicare wellness visit .  had elbow fracture . Had 2 surgery doing better .  Feel  with baby in front and lab related .  Down stairs   Finally getting better still some pain last dexa 2011 and normal risk   gaine 0 pounds on cruise getting g it off by diet.   Sore throat  Runny nose ans sin [place] pressure No fever. Cough and just back from 7 days cruise.     gc had strep before  rx  Daughter has had st  Taking decongestants   Thyroid no change  meds needs refills  recurret sinus sx nees refill singulair   Needs refill oab med some help    Health Maintenance  Topic Date Due  . INFLUENZA VACCINE  12/16/2014  . MAMMOGRAM  02/07/2016  . COLONOSCOPY  06/29/2019  . TETANUS/TDAP  09/08/2021  . DEXA SCAN  Completed  . ZOSTAVAX  Completed  . PNA vac Low Risk Adult  Completed   Health Maintenance Review LIFESTYLE:  Exercise:  Not now  Knee limitations.  Exercise bike .  Tobacco/ETS: no Alcohol:  ocass Sugar beverages: no Sleep:  7-8  Drug use: no Bone density:  Colonoscopy:  Had to canceled the colonoscopy . To resscheduled.  MEDICARE DOCUMENT QUESTIONS  TO SCAN   Hearing: ok  Vision:  No limitations at present . Last eye check UTD  Safety:  Has smoke detector and wears seat belts.  No firearms. No excess sun exposure. Sees dentist regularly.  Falls: see screen Advance directive :  Reviewed  HO given   Memory: Felt to be good  , no concern from her or her family.  Depression: No anhedonia unusual crying or depressive symptoms  Nutrition: Eats well balanced diet; adequate calcium and vitamin D. No swallowing chewing problems.  Injury: no major injuries in the last six  months.  Other healthcare providers:  Reviewed today .  Social:  Lives with spouse married. No pets.  caretakes   Preventive parameters: up-to-date  Reviewed   ADLS:   There are no problems or need for assistance  driving, feeding, obtaining food, dressing, toileting and bathing, managing money using phone. She is independent.    ROS: see above otherw ise neg  GEN/ HEENT: No fever, significant weight changes sweats headaches vision problems hearing changes, CV/ PULM; No chest pain shortness of breath cough, syncope, had ecema on trip has assymptmatic bigeminy  change in exercise tolerance. GI /GU: No adominal pain, vomiting, change in bowel habits. No blood in the stool. No significant GU symptoms. SKIN/HEME: ,no acute skin rashes suspicious lesions or bleeding. No lymphadenopathy, nodules, masses.  NEURO/ PSYCH:  No neurologic signs such as weakness numbness. No depression anxiety. IMM/ Allergy: No unusual infections.  Allergy .   REST of 12 system review negative except as per HPI   Past Medical History  Diagnosis Date  . Colon polyps   . GERD (gastroesophageal reflux disease)     had endo? taking nexium 3 x per week  . Diverticulosis   . Hyperlipidemia     had been on lipitor changes to pravachol cause of cost and tricor has since stopped  NO  se reported   . Anemia   . Impaired fasting glucose   . Hypothyroidism      on meds for years  . Asthma   . Olecranon fracture 10/25/2013    right arm  . Ventricular bigeminy seen on cardiac monitor     Cardiac work up Dr Stanford Breed [facility]  . Fracture of right olecranon process 10/26/2013  . Wears glasses   . Wears dentures     top-partial bottom  . Complication of anesthesia     2 days after surgery-muscles hurtand spasmed-needed a muscle relaxant   . 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 Stroke Mother   . Diabetes Father   .  Alzheimer's disease    . Stomach cancer Maternal Aunt   . Colon cancer Neg Hx   . Pancreatic cancer Neg Hx   . Heart disease Father     History   Social History  . Marital Status: Married    Spouse Name: N/A  . Number of Children: 3  . Years of Education: N/A   Social History Main Topics  . Smoking status: Former Smoker    Quit date: 10/25/1973  . Smokeless tobacco: Never Used  . Alcohol Use: Yes     Comment: socially  . Drug Use: No  . Sexual Activity: Not on file   Other Topics Concern  . 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    Gravida 3 para 3   Last td 6 2008   HS educated       REmote hx of tobacco 1.5 ppd for 6 years as a teen Q 71    Outpatient Encounter Prescriptions as of 09/18/2014  Medication Sig  . fluticasone (FLONASE) 50 MCG/ACT nasal spray Place 2 sprays into the nose daily.  . Fluticasone-Salmeterol (ADVAIR) 250-50 MCG/DOSE AEPB Inhale 1 puff into the lungs every 12 (twelve) hours.  Marland Kitchen levothyroxine (SYNTHROID, LEVOTHROID) 50 MCG tablet Take 1 tablet (50 mcg total) by mouth daily.  . montelukast (SINGULAIR) 10 MG tablet Take 1 tablet (10 mg total) by mouth at bedtime.  . tolterodine (DETROL LA) 2 MG 24 hr capsule Take 1 capsule (2 mg total) by mouth daily.  . [DISCONTINUED] levothyroxine (SYNTHROID, LEVOTHROID) 50 MCG tablet TAKE 1 TABLET DAILY  . [DISCONTINUED] montelukast (SINGULAIR) 10 MG tablet TAKE 1 TABLET AT BEDTIME  . [DISCONTINUED] tolterodine (DETROL LA) 2 MG 24 hr capsule Take 1 capsule (2 mg total) by mouth daily.  . [DISCONTINUED] baclofen (LIORESAL) 10 MG tablet Take 1 tablet (10 mg total) by mouth 3 (three) times daily. As needed for muscle spasm  . [DISCONTINUED] HYDROcodone-homatropine (HYCODAN) 5-1.5 MG/5ML syrup Take 5 mLs by mouth every 4 (four) hours as needed for  cough.  . [DISCONTINUED] latanoprost (XALATAN) 0.005 % ophthalmic solution 1 drop at bedtime.  . [DISCONTINUED] oxyCODONE-acetaminophen (PERCOCET) 10-325 MG per tablet Take 1-2 tablets by mouth every 6 (six) hours as needed for pain. MAXIMUM TOTAL ACETAMINOPHEN DOSE IS 4000 MG PER DAY   No facility-administered encounter medications on file as of 09/18/2014.    EXAM:  BP 150/76 mmHg  Temp(Src)  98.7 F (37.1 C) (Oral)  Ht 5\' 3"  (1.6 m)  Wt 201 lb 14.4 oz (91.581 kg)  BMI 35.77 kg/m2  Body mass index is 35.77 kg/(m^2).  Physical Exam: Vital signs reviewed QBH:ALPF is a well-developed well-nourished alert cooperative   who appears stated age in no acute distress. Congested  Face nontender   HEENT: normocephalic atraumatic , Eyes: PERRL EOM's full, conjunctiva clear, Nares: paten,t no deformity discharge or tenderness., Ears: no deformity EAC's clear TMs with normal landmarks. Mouth: clear OP, no lesions, edema. Minimal redness   Moist mucous membranes. Dentition in adequate repair. NECK: supple without masses, thyromegaly or bruits. CHEST/PULM:  Clear to auscultation and percussion breath sounds equal no wheeze , rales or rhonchi. No chest wall deformities or tenderness. Breast: normal by inspection . No dimpling, discharge, masses, tenderness or discharge . CV: PMI is nondisplaced, S1 S2 no gallops, murmurs, rubs. Peripheral pulses are full without delay.No JVD .  ABDOMEN: Bowel sounds normal nontender  No guard or rebound, no hepato splenomegal no CVA tenderness.  No hernia. Extremtities:  No clubbing cyanosis or edema, no acute joint swelling or redness no focal atrophy healed scar right telbow  NEURO:  Oriented x3, cranial nerves 3-12 appear to be intact, no obvious focal weakness,gait within normal limits no abnormal reflexes or asymmetrical SKIN: No acute rashes normal turgor, color, no bruising or petechiae. Sun changes   PSYCH: Oriented, good eye contact, no obvious depression  anxiety, cognition and judgment appear normal. LN: no cervical axillary inguinal adenopathy No noted deficits in memory, attention, and speech.   ASSESSMENT AND PLAN:  Discussed the following assessment and plan:  Medicare annual wellness visit, subsequent  Sore throat - acts viral but exposed to strep   rs and cx  - Plan: Throat culture, Basic metabolic panel, CBC with Differential/Platelet, Hemoglobin A1c, Hepatic function panel, Lipid panel, TSH, POCT urinalysis dipstick, POCT rapid strep A, Culture, Group A Strep  Elevated BP - docment at home and fu if continue elevated with lsi also  - Plan: Throat culture, Basic metabolic panel, CBC with Differential/Platelet, Hemoglobin A1c, Hepatic function panel, Lipid panel, TSH, POCT urinalysis dipstick, POCT rapid strep A, Culture, Group A Strep  Hyperlipidemia - Plan: Throat culture, Basic metabolic panel, CBC with Differential/Platelet, Hemoglobin A1c, Hepatic function panel, Lipid panel, TSH, POCT urinalysis dipstick, POCT rapid strep A, Culture, Group A Strep  Need for 23-polyvalent pneumococcal polysaccharide vaccine - Plan: Pneumococcal polysaccharide vaccine 23-valent greater than or equal to 2yo subcutaneous/IM  Encounter for Medicare annual wellness exam  Impaired fasting glucose - Plan: Throat culture, Basic metabolic panel, CBC with Differential/Platelet, Hemoglobin A1c, Hepatic function panel, Lipid panel, TSH, POCT urinalysis dipstick, POCT rapid strep A, Culture, Group A Strep  Abnormal LFTs - Plan: Throat culture, Basic metabolic panel, CBC with Differential/Platelet, Hemoglobin A1c, Hepatic function panel, Lipid panel, TSH, POCT urinalysis dipstick, POCT rapid strep A, Culture, Group A Strep  Exposure to Streptococcal pharyngitis - Plan: Throat culture, Basic metabolic panel, CBC with Differential/Platelet, Hemoglobin A1c, Hepatic function panel, Lipid panel, TSH, POCT urinalysis dipstick, POCT rapid strep A, Culture, Group A  Strep  Estrogen deficiency - Plan: DG Bone Density  Hypothyroidism, unspecified hypothyroidism type  OAB (overactive bladder)  Irregular heart rate - bigeminy asymtomatic   Arthritis of right knee  PVC's (premature ventricular contractions) reveiwe dHCM  resp infection dexa to get HO on advance directives  .Marland Kitchen Requested Prescriptions   Signed Prescriptions Disp Refills  . levothyroxine (SYNTHROID, LEVOTHROID) 50 MCG  tablet 90 tablet 3    Sig: Take 1 tablet (50 mcg total) by mouth daily.  . montelukast (SINGULAIR) 10 MG tablet 90 tablet 3    Sig: Take 1 tablet (10 mg total) by mouth at bedtime.  . tolterodine (DETROL LA) 2 MG 24 hr capsule 90 capsule 3    Sig: Take 1 capsule (2 mg total) by mouth daily.      Patient Care Team: Burnis Medin, MD as PCP - General (Internal Medicine) Shon Hough, MD (Ophthalmology) Marchia Bond, MD as Consulting Physician (Orthopedic Surgery)  Patient Instructions   Will check for strep  But probably a viral illness that runs its course. Make appt  For   bone density .    i will put in order   Take blood pressure readings twice a day for  days and then periodically .To ensure below 140/90   .Send in readings      If elevated make appt and dome in with  Your home monitor to check   Healthy lifestyle includes : At least 150 minutes of exercise weeks  , weight at healthy levels, which is usually   BMI 19-25. Avoid trans fats and processed foods;  Increase fresh fruits and veges to 5 servings per day. And avoid sweet beverages including tea and juice. Mediterranean diet with olive oil and nuts have been noted to be heart and brain healthy . Avoid tobacco products . Limit  alcohol to  7 per week for women and 14 servings for men.  Get adequate sleep . Wear seat belts . Don't text and drive .   yealry flu vaccine  Get colonsocopy If no cancer or recently abnomal paps or risk  We stop doing paps at 24 as    Not helpful  improtant to your  health .  Will check records      Why follow it? Research shows. . Those who follow the Mediterranean diet have a reduced risk of heart disease  . The diet is associated with a reduced incidence of Parkinson's and Alzheimer's diseases . People following the diet may have longer life expectancies and lower rates of chronic diseases  . The Dietary Guidelines for Americans recommends the Mediterranean diet as an eating plan to promote health and prevent disease  What Is the Mediterranean Diet?  . Healthy eating plan based on typical foods and recipes of Mediterranean-style cooking . The diet is primarily a plant based diet; these foods should make up a majority of meals   Starches - Plant based foods should make up a majority of meals - They are an important sources of vitamins, minerals, energy, antioxidants, and fiber - Choose whole grains, foods high in fiber and minimally processed items  - Typical grain sources include wheat, oats, barley, corn, brown rice, bulgar, farro, millet, polenta, couscous  - Various types of beans include chickpeas, lentils, fava beans, black beans, white beans   Fruits  Veggies - Large quantities of antioxidant rich fruits & veggies; 6 or more servings  - Vegetables can be eaten raw or lightly drizzled with oil and cooked  - Vegetables common to the traditional Mediterranean Diet include: artichokes, arugula, beets, broccoli, brussel sprouts, cabbage, carrots, celery, collard greens, cucumbers, eggplant, kale, leeks, lemons, lettuce, mushrooms, okra, onions, peas, peppers, potatoes, pumpkin, radishes, rutabaga, shallots, spinach, sweet potatoes, turnips, zucchini - Fruits common to the Mediterranean Diet include: apples, apricots, avocados, cherries, clementines, dates, figs, grapefruits, grapes, melons, nectarines, oranges, peaches, pears, pomegranates,  strawberries, tangerines  Fats - Replace butter and margarine with healthy oils, such as olive oil, canola oil,  and tahini  - Limit nuts to no more than a handful a day  - Nuts include walnuts, almonds, pecans, pistachios, pine nuts  - Limit or avoid candied, honey roasted or heavily salted nuts - Olives are central to the Mediterranean diet - can be eaten whole or used in a variety of dishes   Meats Protein - Limiting red meat: no more than a few times a month - When eating red meat: choose lean cuts and keep the portion to the size of deck of cards - Eggs: approx. 0 to 4 times a week  - Fish and lean poultry: at least 2 a week  - Healthy protein sources include, chicken, [place], lean beef, lamb - Increase intake of seafood such as tuna, salmon, trout, mackerel, shrimp, scallops - Avoid or limit high fat processed meats such as sausage and bacon  Dairy - Include moderate amounts of low fat dairy products  - Focus on healthy dairy such as fat free yogurt, skim milk, low or reduced fat cheese - Limit dairy products higher in fat such as whole or 2% milk, cheese, ice cream  Alcohol - Moderate amounts of red wine is ok  - No more than 5 oz daily for women (all ages) and men older than age 36  - No more than 10 oz of wine daily for men younger than 62  Other - Limit sweets and other desserts  - Use herbs and spices instead of salt to flavor foods  - Herbs and spices common to the traditional Mediterranean Diet include: basil, bay leaves, chives, cloves, cumin, fennel, garlic, lavender, marjoram, mint, oregano, parsley, pepper, rosemary, sage, savory, sumac, tarragon, thyme   It's not just a diet, it's a lifestyle:  . The Mediterranean diet includes lifestyle factors typical of those in the region  . Foods, drinks and meals are best eaten with others and savored . Daily physical activity is important for overall good health . This could be strenuous exercise like running and aerobics . This could also be more leisurely activities such as walking, housework, yard-work, or taking the stairs . Moderation  is the key; a balanced and healthy diet accommodates most foods and drinks . Consider portion sizes and frequency of consumption of certain foods   Meal Ideas & Options:  . Breakfast:  o Whole wheat toast or whole wheat English muffins with peanut butter & hard boiled egg o Steel cut oats topped with apples & cinnamon and skim milk  o Fresh fruit: banana, strawberries, melon, berries, peaches  o Smoothies: strawberries, bananas, greek yogurt, peanut butter o Low fat greek yogurt with blueberries and granola  o Egg white omelet with spinach and mushrooms o Breakfast couscous: whole wheat couscous, apricots, skim milk, cranberries  . Sandwiches:  o Hummus and grilled vegetables (peppers, zucchini, squash) on whole wheat bread   o Grilled chicken on whole wheat pita with lettuce, tomatoes, cucumbers or tzatziki  o Tuna salad on whole wheat bread: tuna salad made with greek yogurt, olives, red peppers, capers, green onions o Garlic rosemary lamb pita: lamb sauted with garlic, rosemary, salt & pepper; add lettuce, cucumber, greek yogurt to pita - flavor with lemon juice and black pepper  . Seafood:  o Mediterranean grilled salmon, seasoned with garlic, basil, parsley, lemon juice and black pepper o Shrimp, lemon, and spinach whole-grain pasta salad made with low  fat greek yogurt  o Seared scallops with lemon orzo  o Seared tuna steaks seasoned salt, pepper, coriander topped with tomato mixture of olives, tomatoes, olive oil, minced garlic, parsley, green onions and cappers  . Meats:  o Herbed greek chicken salad with kalamata olives, cucumber, feta  o Red bell peppers stuffed with spinach, bulgur, lean ground beef (or lentils) & topped with feta   o Kebabs: skewers of chicken, tomatoes, onions, zucchini, squash  o [place] burgers: made with red onions, mint, dill, lemon juice, feta cheese topped with roasted red peppers . Vegetarian o Cucumber salad: cucumbers, artichoke hearts, celery, red  onion, feta cheese, tossed in olive oil & lemon juice  o Hummus and whole grain pita points with a greek salad (lettuce, tomato, feta, olives, cucumbers, red onion) o Lentil soup with celery, carrots made with vegetable broth, garlic, salt and pepper  o Tabouli salad: parsley, bulgur, mint, scallions, cucumbers, tomato, radishes, lemon juice, olive oil, salt and pepper.            Standley Brooking. Rosezetta Balderston M.D.

## 2014-09-18 NOTE — Patient Instructions (Addendum)
Will check for strep  But probably a viral illness that runs its course. Make appt  For   bone density .    i will put in order   Take blood pressure readings twice a day for  days and then periodically .To ensure below 140/90   .Send in readings      If elevated make appt and dome in with  Your home monitor to check   Healthy lifestyle includes : At least 150 minutes of exercise weeks  , weight at healthy levels, which is usually   BMI 19-25. Avoid trans fats and processed foods;  Increase fresh fruits and veges to 5 servings per day. And avoid sweet beverages including tea and juice. Mediterranean diet with olive oil and nuts have been noted to be heart and brain healthy . Avoid tobacco products . Limit  alcohol to  7 per week for women and 14 servings for men.  Get adequate sleep . Wear seat belts . Don't text and drive .   yealry flu vaccine  Get colonsocopy If no cancer or recently abnomal paps or risk  We stop doing paps at 14 as    Not helpful  improtant to your health .  Will check records      Why follow it? Research shows. . Those who follow the Mediterranean diet have a reduced risk of heart disease  . The diet is associated with a reduced incidence of Parkinson's and Alzheimer's diseases . People following the diet may have longer life expectancies and lower rates of chronic diseases  . The Dietary Guidelines for Americans recommends the Mediterranean diet as an eating plan to promote health and prevent disease  What Is the Mediterranean Diet?  . Healthy eating plan based on typical foods and recipes of Mediterranean-style cooking . The diet is primarily a plant based diet; these foods should make up a majority of meals   Starches - Plant based foods should make up a majority of meals - They are an important sources of vitamins, minerals, energy, antioxidants, and fiber - Choose whole grains, foods high in fiber and minimally processed items  - Typical grain sources  include wheat, oats, barley, corn, brown rice, bulgar, farro, millet, polenta, couscous  - Various types of beans include chickpeas, lentils, fava beans, black beans, white beans   Fruits  Veggies - Large quantities of antioxidant rich fruits & veggies; 6 or more servings  - Vegetables can be eaten raw or lightly drizzled with oil and cooked  - Vegetables common to the traditional Mediterranean Diet include: artichokes, arugula, beets, broccoli, brussel sprouts, cabbage, carrots, celery, collard greens, cucumbers, eggplant, kale, leeks, lemons, lettuce, mushrooms, okra, onions, peas, peppers, potatoes, pumpkin, radishes, rutabaga, shallots, spinach, sweet potatoes, turnips, zucchini - Fruits common to the Mediterranean Diet include: apples, apricots, avocados, cherries, clementines, dates, figs, grapefruits, grapes, melons, nectarines, oranges, peaches, pears, pomegranates, strawberries, tangerines  Fats - Replace butter and margarine with healthy oils, such as olive oil, canola oil, and tahini  - Limit nuts to no more than a handful a day  - Nuts include walnuts, almonds, pecans, pistachios, pine nuts  - Limit or avoid candied, honey roasted or heavily salted nuts - Olives are central to the [employer] - can be eaten whole or used in a variety of dishes   Meats Protein - Limiting red meat: no more than a few times a month - When eating red meat: choose lean cuts and keep the portion  to the size of deck of cards - Eggs: approx. 0 to 4 times a week  - Fish and lean poultry: at least 2 a week  - Healthy protein sources include, chicken, [place], lean beef, lamb - Increase intake of seafood such as tuna, salmon, trout, mackerel, shrimp, scallops - Avoid or limit high fat processed meats such as sausage and bacon  Dairy - Include moderate amounts of low fat dairy products  - Focus on healthy dairy such as fat free yogurt, skim milk, low or reduced fat cheese - Limit dairy products higher in  fat such as whole or 2% milk, cheese, ice cream  Alcohol - Moderate amounts of red wine is ok  - No more than 5 oz daily for women (all ages) and men older than age 54  - No more than 10 oz of wine daily for men younger than 61  Other - Limit sweets and other desserts  - Use herbs and spices instead of salt to flavor foods  - Herbs and spices common to the traditional Mediterranean Diet include: basil, bay leaves, chives, cloves, cumin, fennel, garlic, lavender, marjoram, mint, oregano, parsley, pepper, rosemary, sage, savory, sumac, tarragon, thyme   It's not just a diet, it's a lifestyle:  . The Mediterranean diet includes lifestyle factors typical of those in the region  . Foods, drinks and meals are best eaten with others and savored . Daily physical activity is important for overall good health . This could be strenuous exercise like running and aerobics . This could also be more leisurely activities such as walking, housework, yard-work, or taking the stairs . Moderation is the key; a balanced and healthy diet accommodates most foods and drinks . Consider portion sizes and frequency of consumption of certain foods   Meal Ideas & Options:  . Breakfast:  o Whole wheat toast or whole wheat English muffins with peanut butter & hard boiled egg o Steel cut oats topped with apples & cinnamon and skim milk  o Fresh fruit: banana, strawberries, melon, berries, peaches  o Smoothies: strawberries, bananas, greek yogurt, peanut butter o Low fat greek yogurt with blueberries and granola  o Egg white omelet with spinach and mushrooms o Breakfast couscous: whole wheat couscous, apricots, skim milk, cranberries  . Sandwiches:  o Hummus and grilled vegetables (peppers, zucchini, squash) on whole wheat bread   o Grilled chicken on whole wheat pita with lettuce, tomatoes, cucumbers or tzatziki  o Tuna salad on whole wheat bread: tuna salad made with greek yogurt, olives, red peppers, capers, green  onions o Garlic rosemary lamb pita: lamb sauted with garlic, rosemary, salt & pepper; add lettuce, cucumber, greek yogurt to pita - flavor with lemon juice and black pepper  . Seafood:  o Mediterranean grilled salmon, seasoned with garlic, basil, parsley, lemon juice and black pepper o Shrimp, lemon, and spinach whole-grain pasta salad made with low fat greek yogurt  o Seared scallops with lemon orzo  o Seared tuna steaks seasoned salt, pepper, coriander topped with tomato mixture of olives, tomatoes, olive oil, minced garlic, parsley, green onions and cappers  . Meats:  o Herbed greek chicken salad with kalamata olives, cucumber, feta  o Red bell peppers stuffed with spinach, bulgur, lean ground beef (or lentils) & topped with feta   o Kebabs: skewers of chicken, tomatoes, onions, zucchini, squash  o [place] burgers: made with red onions, mint, dill, lemon juice, feta cheese topped with roasted red peppers . Vegetarian o Cucumber salad:  cucumbers, artichoke hearts, celery, red onion, feta cheese, tossed in olive oil & lemon juice  o Hummus and whole grain pita points with a greek salad (lettuce, tomato, feta, olives, cucumbers, red onion) o Lentil soup with celery, carrots made with vegetable broth, garlic, salt and pepper  o Tabouli salad: parsley, bulgur, mint, scallions, cucumbers, tomato, radishes, lemon juice, olive oil, salt and pepper.

## 2014-09-18 NOTE — Assessment & Plan Note (Signed)
No sx 

## 2014-09-20 ENCOUNTER — Encounter: Payer: Self-pay | Admitting: Internal Medicine

## 2014-09-20 ENCOUNTER — Inpatient Hospital Stay: Admission: RE | Admit: 2014-09-20 | Payer: Medicare Other | Source: Ambulatory Visit

## 2014-09-20 ENCOUNTER — Ambulatory Visit (INDEPENDENT_AMBULATORY_CARE_PROVIDER_SITE_OTHER): Payer: Medicare Other | Admitting: Internal Medicine

## 2014-09-20 VITALS — BP 150/74 | Temp 98.9°F | Ht 63.0 in | Wt 200.5 lb

## 2014-09-20 DIAGNOSIS — J45909 Unspecified asthma, uncomplicated: Secondary | ICD-10-CM | POA: Diagnosis not present

## 2014-09-20 DIAGNOSIS — R030 Elevated blood-pressure reading, without diagnosis of hypertension: Secondary | ICD-10-CM

## 2014-09-20 DIAGNOSIS — J018 Other acute sinusitis: Secondary | ICD-10-CM

## 2014-09-20 DIAGNOSIS — IMO0001 Reserved for inherently not codable concepts without codable children: Secondary | ICD-10-CM | POA: Insufficient documentation

## 2014-09-20 LAB — CULTURE, GROUP A STREP: Organism ID, Bacteria: NORMAL

## 2014-09-20 MED ORDER — HYDROCODONE-HOMATROPINE 5-1.5 MG/5ML PO SYRP: 5.0000 mL | ORAL_SOLUTION | ORAL | Status: DC | PRN

## 2014-09-20 MED ORDER — AMOXICILLIN-POT CLAVULANATE 875-125 MG PO TABS: 1.0000 | ORAL_TABLET | Freq: Two times a day (BID) | ORAL | Status: DC

## 2014-09-20 NOTE — Progress Notes (Signed)
Pre visit review using our clinic review tool, if applicable. No additional management support is needed unless otherwise documented below in the visit note.   Chief Complaint  Patient presents with  . Cough    Started last Sunday  . Nasal Congestion  . Diarrhea  . Headache  . Sinus Pain and Pressure    HPI: Patient Marie Kelly  comes in today for SDA for  new problem evaluation.  No fever.   Progressing uri and now 3 days of  Facial.  Left  Side  pain pressure no relieved by reg measure  Cough no hemoptysis   See past note  Pt says different from usual uri cause of pain and pressure. daughert had resp infection ans was put on a "z pack"   Loose stools not really diarrhea taking lots pf cold meds  ROS: See pertinent positives and negatives per HPI. No wheezing asthma flare sob  Past Medical History  Diagnosis Date  . Colon polyps   . GERD (gastroesophageal reflux disease)     had endo? taking nexium 3 x per week  . Diverticulosis   . Hyperlipidemia     had been on lipitor changes to pravachol cause of cost and tricor has since stopped  NO  se reported   . Anemia   . Impaired fasting glucose   . Hypothyroidism      on meds for years  . Asthma   . Olecranon fracture 10/25/2013    right arm  . Ventricular bigeminy seen on cardiac monitor     Cardiac work up Dr Stanford Breed [facility]  . Fracture of right olecranon process 10/26/2013  . Wears glasses   . Wears dentures     top-partial bottom  . Complication of anesthesia     2 days after surgery-muscles hurtand spasmed-needed a muscle relaxant   . 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 Stroke Mother   . Diabetes Father   . Alzheimer's disease    . Stomach cancer Maternal Aunt   . Colon cancer Neg Hx   . Pancreatic cancer Neg Hx   . Heart disease Father     History   Social History  . Marital Status: Married    Spouse Name:  N/A  . Number of Children: 3  . Years of Education: N/A   Social History Main Topics  . Smoking status: Former Smoker    Quit date: 10/25/1973  . Smokeless tobacco: Never Used  . Alcohol Use: Yes     Comment: socially  . Drug Use: No  . Sexual Activity: Not on file   Other Topics Concern  . 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    Gravida 3 para 3   Last td 6 2008   HS educated       REmote hx of tobacco 1.5 ppd for 6 years as a teen Q 75    Outpatient Prescriptions Prior to Visit  Medication Sig Dispense Refill  . fluticasone (FLONASE) 50 MCG/ACT nasal spray Place 2 sprays into the nose daily.    . Fluticasone-Salmeterol (ADVAIR) 250-50 MCG/DOSE AEPB Inhale 1 puff into  the lungs every 12 (twelve) hours.    Marland Kitchen levothyroxine (SYNTHROID, LEVOTHROID) 50 MCG tablet Take 1 tablet (50 mcg total) by mouth daily. 90 tablet 3  . montelukast (SINGULAIR) 10 MG tablet Take 1 tablet (10 mg total) by mouth at bedtime. 90 tablet 3  . tolterodine (DETROL LA) 2 MG 24 hr capsule Take 1 capsule (2 mg total) by mouth daily. 90 capsule 3   No facility-administered medications prior to visit.     EXAM:  BP 150/74 mmHg  Temp(Src) 98.9 F (37.2 C) (Oral)  Ht 5\' 3"  (1.6 m)  Wt 200 lb 8 oz (90.946 kg)  BMI 35.53 kg/m2  Body mass index is 35.53 kg/(m^2). WDWN in NAD  quiet respirations; very congested  somewhat hoarse. Non toxic . HEENT: Normocephalic ;atraumatic , Eyes;  PERRL, EOMs  Full, lids and conjunctiva clear,,Ears: no deformities, canals nl, TM landmarks normal, Nose: no deformity or  Mucoid dc discharge but congested;face left max left frontal tender Mouth : OP clear without lesion or edema . Neck: Supple without adenopathy or masses or bruits Chest:  Clear to A&P without wheezes rales or rhonchi CV:  S1-S2 no gallops or  murmurs peripheral perfusion is normal Skin :nl perfusion and no acute rashes   ASSESSMENT AND PLAN:  Discussed the following assessment and plan:  Other acute sinusitis - left max frontal areas  underlying allergy and uri recently  Asthma, unspecified asthma severity, uncomplicated  Uri less than 10 days  But  With face pain  unreponsive to  Measures   Viral trigger underlying allergy face pain not respoinding to reg measures  Friday before weekend  Had 2 loose stools not really diarrhea.    Risk benefit of medication discussed.  -Patient advised to return or notify health care team  if symptoms worsen ,persist or new concerns arise.  Patient Instructions  This is a viral respiratory infection conplicated by a sinus infection  .   Antibiotic for bacterial sinusitis .   Sinusitis Sinusitis is redness, soreness, and inflammation of the paranasal sinuses. Paranasal sinuses are air pockets within the bones of your face (beneath the eyes, the middle of the forehead, or above the eyes). In healthy paranasal sinuses, mucus is able to drain out, and air is able to circulate through them by way of your nose. However, when your paranasal sinuses are inflamed, mucus and air can become trapped. This can allow bacteria and other germs to grow and cause infection. Sinusitis can develop quickly and last only a short time (acute) or continue over a long period (chronic). Sinusitis that lasts for more than 12 weeks is considered chronic.  CAUSES  Causes of sinusitis include:  Allergies.  Structural abnormalities, such as displacement of the cartilage that separates your nostrils (deviated septum), which can decrease the air flow through your nose and sinuses and affect sinus drainage.  Functional abnormalities, such as when the small hairs (cilia) that line your sinuses and help remove mucus do not work properly or are not present. SIGNS AND SYMPTOMS  Symptoms of acute and chronic sinusitis are the  same. The primary symptoms are pain and pressure around the affected sinuses. Other symptoms include:  Upper toothache.  Earache.  Headache.  Bad breath.  Decreased sense of smell and taste.  A cough, which worsens when you are lying flat.  Fatigue.  Fever.  Thick drainage from your nose, which often is green and may contain pus (purulent).  Swelling and warmth over the affected  sinuses. DIAGNOSIS  Your health care provider will perform a physical exam. During the exam, your health care provider may:  Look in your nose for signs of abnormal growths in your nostrils (nasal polyps).  Tap over the affected sinus to check for signs of infection.  View the inside of your sinuses (endoscopy) using an imaging device that has a light attached (endoscope). If your health care provider suspects that you have chronic sinusitis, one or more of the following tests may be recommended:  Allergy tests.  Nasal culture. A sample of mucus is taken from your nose, sent to a lab, and screened for bacteria.  Nasal cytology. A sample of mucus is taken from your nose and examined by your health care provider to determine if your sinusitis is related to an allergy. TREATMENT  Most cases of acute sinusitis are related to a viral infection and will resolve on their own within 10 days. Sometimes medicines are prescribed to help relieve symptoms (pain medicine, decongestants, nasal steroid sprays, or saline sprays).  However, for sinusitis related to a bacterial infection, your health care provider will prescribe antibiotic medicines. These are medicines that will help kill the bacteria causing the infection.  Rarely, sinusitis is caused by a fungal infection. In theses cases, your health care provider will prescribe antifungal medicine. For some cases of chronic sinusitis, surgery is needed. Generally, these are cases in which sinusitis recurs more than 3 times per year, despite other treatments. HOME  CARE INSTRUCTIONS   Drink plenty of water. Water helps thin the mucus so your sinuses can drain more easily.  Use a humidifier.  Inhale steam 3 to 4 times a day (for example, sit in the bathroom with the shower running).  Apply a warm, moist washcloth to your face 3 to 4 times a day, or as directed by your health care provider.  Use saline nasal sprays to help moisten and clean your sinuses.  Take medicines only as directed by your health care provider.  If you were prescribed either an antibiotic or antifungal medicine, finish it all even if you start to feel better. SEEK IMMEDIATE MEDICAL CARE IF:  You have increasing pain or severe headaches.  You have nausea, vomiting, or drowsiness.  You have swelling around your face.  You have vision problems.  You have a stiff neck.  You have difficulty breathing. MAKE SURE YOU:   Understand these instructions.  Will watch your condition.  Will get help right away if you are not doing well or get worse. Document Released: 05/03/2005 Document Revised: 09/17/2013 Document Reviewed: 05/18/2011 [facility] Patient Information 2015 [place], [place]. This information is not intended to replace advice given to you by your health care provider. Make sure you discuss any questions you have with your health care provider.      Standley Brooking. Jann Milkovich M.D.

## 2014-09-20 NOTE — Patient Instructions (Signed)
This is a viral respiratory infection conplicated by a sinus infection  .   Antibiotic for bacterial sinusitis .   Sinusitis Sinusitis is redness, soreness, and inflammation of the paranasal sinuses. Paranasal sinuses are air pockets within the bones of your face (beneath the eyes, the middle of the forehead, or above the eyes). In healthy paranasal sinuses, mucus is able to drain out, and air is able to circulate through them by way of your nose. However, when your paranasal sinuses are inflamed, mucus and air can become trapped. This can allow bacteria and other germs to grow and cause infection. Sinusitis can develop quickly and last only a short time (acute) or continue over a long period (chronic). Sinusitis that lasts for more than 12 weeks is considered chronic.  CAUSES  Causes of sinusitis include:  Allergies.  Structural abnormalities, such as displacement of the cartilage that separates your nostrils (deviated septum), which can decrease the air flow through your nose and sinuses and affect sinus drainage.  Functional abnormalities, such as when the small hairs (cilia) that line your sinuses and help remove mucus do not work properly or are not present. SIGNS AND SYMPTOMS  Symptoms of acute and chronic sinusitis are the same. The primary symptoms are pain and pressure around the affected sinuses. Other symptoms include:  Upper toothache.  Earache.  Headache.  Bad breath.  Decreased sense of smell and taste.  A cough, which worsens when you are lying flat.  Fatigue.  Fever.  Thick drainage from your nose, which often is green and may contain pus (purulent).  Swelling and warmth over the affected sinuses. DIAGNOSIS  Your health care provider will perform a physical exam. During the exam, your health care provider may:  Look in your nose for signs of abnormal growths in your nostrils (nasal polyps).  Tap over the affected sinus to check for signs of infection.  View  the inside of your sinuses (endoscopy) using an imaging device that has a light attached (endoscope). If your health care provider suspects that you have chronic sinusitis, one or more of the following tests may be recommended:  Allergy tests.  Nasal culture. A sample of mucus is taken from your nose, sent to a lab, and screened for bacteria.  Nasal cytology. A sample of mucus is taken from your nose and examined by your health care provider to determine if your sinusitis is related to an allergy. TREATMENT  Most cases of acute sinusitis are related to a viral infection and will resolve on their own within 10 days. Sometimes medicines are prescribed to help relieve symptoms (pain medicine, decongestants, nasal steroid sprays, or saline sprays).  However, for sinusitis related to a bacterial infection, your health care provider will prescribe antibiotic medicines. These are medicines that will help kill the bacteria causing the infection.  Rarely, sinusitis is caused by a fungal infection. In theses cases, your health care provider will prescribe antifungal medicine. For some cases of chronic sinusitis, surgery is needed. Generally, these are cases in which sinusitis recurs more than 3 times per year, despite other treatments. HOME CARE INSTRUCTIONS   Drink plenty of water. Water helps thin the mucus so your sinuses can drain more easily.  Use a humidifier.  Inhale steam 3 to 4 times a day (for example, sit in the bathroom with the shower running).  Apply a warm, moist washcloth to your face 3 to 4 times a day, or as directed by your health care provider.  Use  saline nasal sprays to help moisten and clean your sinuses.  Take medicines only as directed by your health care provider.  If you were prescribed either an antibiotic or antifungal medicine, finish it all even if you start to feel better. SEEK IMMEDIATE MEDICAL CARE IF:  You have increasing pain or severe headaches.  You have  nausea, vomiting, or drowsiness.  You have swelling around your face.  You have vision problems.  You have a stiff neck.  You have difficulty breathing. MAKE SURE YOU:   Understand these instructions.  Will watch your condition.  Will get help right away if you are not doing well or get worse. Document Released: 05/03/2005 Document Revised: 09/17/2013 Document Reviewed: 05/18/2011 [facility] Patient Information 2015 [place], [place]. This information is not intended to replace advice given to you by your health care provider. Make sure you discuss any questions you have with your health care provider.

## 2014-09-23 ENCOUNTER — Other Ambulatory Visit: Payer: Self-pay | Admitting: Family Medicine

## 2014-09-23 DIAGNOSIS — R8290 Unspecified abnormal findings in urine: Secondary | ICD-10-CM

## 2014-09-23 DIAGNOSIS — S52021K Displaced fracture of olecranon process without intraarticular extension of right ulna, subsequent encounter for closed fracture with nonunion: Secondary | ICD-10-CM | POA: Diagnosis not present

## 2014-09-25 ENCOUNTER — Ambulatory Visit (INDEPENDENT_AMBULATORY_CARE_PROVIDER_SITE_OTHER)
Admission: RE | Admit: 2014-09-25 | Discharge: 2014-09-25 | Disposition: A | Discharge status: Home / Self Care | Payer: Medicare Other | Source: Ambulatory Visit | Attending: Internal Medicine | Admitting: Internal Medicine

## 2014-09-25 DIAGNOSIS — E2839 Other primary ovarian failure: Secondary | ICD-10-CM | POA: Diagnosis not present

## 2014-09-27 DIAGNOSIS — M9905 Segmental and somatic dysfunction of pelvic region: Secondary | ICD-10-CM | POA: Diagnosis not present

## 2014-09-27 DIAGNOSIS — M9903 Segmental and somatic dysfunction of lumbar region: Secondary | ICD-10-CM | POA: Diagnosis not present

## 2014-09-27 DIAGNOSIS — M9904 Segmental and somatic dysfunction of sacral region: Secondary | ICD-10-CM | POA: Diagnosis not present

## 2014-09-27 DIAGNOSIS — M791 Myalgia: Secondary | ICD-10-CM | POA: Diagnosis not present

## 2014-09-27 DIAGNOSIS — M545 Low back pain: Secondary | ICD-10-CM | POA: Diagnosis not present

## 2014-09-27 DIAGNOSIS — M47817 Spondylosis without myelopathy or radiculopathy, lumbosacral region: Secondary | ICD-10-CM | POA: Diagnosis not present

## 2014-10-10 DIAGNOSIS — M9904 Segmental and somatic dysfunction of sacral region: Secondary | ICD-10-CM | POA: Diagnosis not present

## 2014-10-10 DIAGNOSIS — M47817 Spondylosis without myelopathy or radiculopathy, lumbosacral region: Secondary | ICD-10-CM | POA: Diagnosis not present

## 2014-10-10 DIAGNOSIS — M545 Low back pain: Secondary | ICD-10-CM | POA: Diagnosis not present

## 2014-10-10 DIAGNOSIS — M791 Myalgia: Secondary | ICD-10-CM | POA: Diagnosis not present

## 2014-10-10 DIAGNOSIS — M9903 Segmental and somatic dysfunction of lumbar region: Secondary | ICD-10-CM | POA: Diagnosis not present

## 2014-10-10 DIAGNOSIS — M9905 Segmental and somatic dysfunction of pelvic region: Secondary | ICD-10-CM | POA: Diagnosis not present

## 2014-10-23 ENCOUNTER — Encounter: Payer: Self-pay | Admitting: Internal Medicine

## 2014-10-23 ENCOUNTER — Ambulatory Visit (INDEPENDENT_AMBULATORY_CARE_PROVIDER_SITE_OTHER): Payer: Medicare Other | Admitting: Internal Medicine

## 2014-10-23 VITALS — BP 164/70 | Temp 98.5°F | Wt 204.8 lb

## 2014-10-23 DIAGNOSIS — M9905 Segmental and somatic dysfunction of pelvic region: Secondary | ICD-10-CM | POA: Diagnosis not present

## 2014-10-23 DIAGNOSIS — M9903 Segmental and somatic dysfunction of lumbar region: Secondary | ICD-10-CM | POA: Diagnosis not present

## 2014-10-23 DIAGNOSIS — I499 Cardiac arrhythmia, unspecified: Secondary | ICD-10-CM | POA: Diagnosis not present

## 2014-10-23 DIAGNOSIS — I10 Essential (primary) hypertension: Secondary | ICD-10-CM

## 2014-10-23 DIAGNOSIS — I498 Other specified cardiac arrhythmias: Secondary | ICD-10-CM

## 2014-10-23 DIAGNOSIS — M791 Myalgia: Secondary | ICD-10-CM | POA: Diagnosis not present

## 2014-10-23 DIAGNOSIS — M47817 Spondylosis without myelopathy or radiculopathy, lumbosacral region: Secondary | ICD-10-CM | POA: Diagnosis not present

## 2014-10-23 DIAGNOSIS — M9904 Segmental and somatic dysfunction of sacral region: Secondary | ICD-10-CM | POA: Diagnosis not present

## 2014-10-23 DIAGNOSIS — M129 Arthropathy, unspecified: Secondary | ICD-10-CM | POA: Diagnosis not present

## 2014-10-23 DIAGNOSIS — M545 Low back pain: Secondary | ICD-10-CM | POA: Diagnosis not present

## 2014-10-23 DIAGNOSIS — M1711 Unilateral primary osteoarthritis, right knee: Secondary | ICD-10-CM

## 2014-10-23 MED ORDER — LISINOPRIL 10 MG PO TABS: 10.0000 mg | ORAL_TABLET | Freq: Every day | ORAL | Status: DC

## 2014-10-23 NOTE — Progress Notes (Signed)
Pre visit review using our clinic review tool, if applicable. No additional management support is needed unless otherwise documented below in the visit note.  Chief Complaint  Patient presents with  . Elevated BP    HPI: Patient Marie Kelly  comes in today for SDA for problem evaluation.  elevated bp readings since last visit  Brings in monitor readings 140 - 683 range diastolic 70 - 419 ocass nl readings noted  Hard to walked exercise cause of knee pain  Lots of stress at work. Cannot take hctz allergic rx in past   ROS: See pertinent positives and negatives per HPI. No new cp sob  Here with child today  Past Medical History  Diagnosis Date  . Colon polyps   . GERD (gastroesophageal reflux disease)     had endo? taking nexium 3 x per week  . Diverticulosis   . Hyperlipidemia     had been on lipitor changes to pravachol cause of cost and tricor has since stopped  NO  se reported   . Anemia   . Impaired fasting glucose   . Hypothyroidism      on meds for years  . Asthma   . Olecranon fracture 10/25/2013    right arm  . Ventricular bigeminy seen on cardiac monitor     Cardiac work up Dr Stanford Breed [facility]  . Fracture of right olecranon process 10/26/2013  . Wears glasses   . Wears dentures     top-partial bottom  . Complication of anesthesia     2 days after surgery-muscles hurtand spasmed-needed a muscle relaxant   . 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 Stroke Mother   . Diabetes Father   . Alzheimer's disease    . Stomach cancer Maternal Aunt   . Colon cancer Neg Hx   . Pancreatic cancer Neg Hx   . Heart disease Father     History   Social History  . Marital Status: Married    Spouse Name: N/A  . Number of Children: 3  . Years of Education: N/A   Social History Main Topics  . Smoking status: Former Smoker    Quit date: 10/25/1973  . Smokeless tobacco: Never  Used  . Alcohol Use: Yes     Comment: socially  . Drug Use: No  . Sexual Activity: Not on file   Other Topics Concern  . 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    Gravida 3 para 3   Last td 6 2008   HS educated       REmote hx of tobacco 1.5 ppd for 6 years as a teen Q 20    Outpatient Prescriptions Prior to Visit  Medication Sig Dispense Refill  . levothyroxine (SYNTHROID, LEVOTHROID) 50 MCG tablet Take 1 tablet (50 mcg total) by mouth daily. 90 tablet 3  . montelukast (SINGULAIR) 10 MG tablet Take 1 tablet (10 mg total) by mouth at bedtime. 90 tablet 3  . tolterodine (DETROL LA) 2 MG 24 hr capsule Take 1 capsule (2 mg total) by mouth daily. 90 capsule 3  . Fluticasone-Salmeterol (ADVAIR) 250-50 MCG/DOSE AEPB Inhale 1  puff into the lungs every 12 (twelve) hours.    Marland Kitchen amoxicillin-clavulanate (AUGMENTIN) 875-125 MG per tablet Take 1 tablet by mouth every 12 (twelve) hours. For sinusitis 14 tablet 0  . fluticasone (FLONASE) 50 MCG/ACT nasal spray Place 2 sprays into the nose daily.    Marland Kitchen HYDROcodone-homatropine (HYCODAN) 5-1.5 MG/5ML syrup Take 5 mLs by mouth every 4 (four) hours as needed for cough. 180 mL 0   No facility-administered medications prior to visit.     EXAM:  BP 164/70 mmHg  Temp(Src) 98.5 F (36.9 C) (Oral)  Wt 204 lb 12.8 oz (92.897 kg)  Body mass index is 36.29 kg/(m^2).  GENERAL: vitals reviewed and listed above, alert, oriented, appears well hydrated and in no acute distress HEENT: atraumatic, conjunctiva  clear, no obvious abnormalities on inspection of external nose and ears CV: HR bigeminy , no clubbing cyanosis or  peripheral edema nl cap refill  MS: moves all extremities without noticeable focal  Abnormality limps  PSYCH: pleasant and cooperative, no obvious depression or anxiety BP  Readings from Last 3 Encounters:  10/23/14 164/70  09/20/14 150/74  09/18/14 150/76   Wt Readings from Last 3 Encounters:  10/23/14 204 lb 12.8 oz (92.897 kg)  09/20/14 200 lb 8 oz (90.946 kg)  09/18/14 201 lb 14.4 oz (91.581 kg)   Reviewed her machine and our monitor and readings  Log  ASSESSMENT AND PLAN:  Discussed the following assessment and plan:  Essential hypertension - monitor cw office readings all rx to hctz in past add acei with dic risk benefit may need 2 meds  but go slow   Bigeminy  Arthritis of right knee - problematic Add acei med dose and plan fu increase as tolerated . Se poss reviewed  -Patient advised to return or notify health care team  if symptoms worsen ,persist or new concerns arise. Total visit 68mins > 50% spent counseling and coordinating care as indicated in above note and in instructions to patient .     Patient Instructions  Your machine is good enough  To decide on blood pressure    Treatment .   Begin medication once a day   ROV  in about  6-8 weeks  Or in bp readings not coming down under 160 in 2 weeks .     Standley Brooking. Emigdio Wildeman M.D.

## 2014-10-23 NOTE — Patient Instructions (Signed)
Your machine is good enough  To decide on blood pressure    Treatment .   Begin medication once a day   ROV  in about  6-8 weeks  Or in bp readings not coming down under 160 in 2 weeks .

## 2014-11-04 DIAGNOSIS — S52021D Displaced fracture of olecranon process without intraarticular extension of right ulna, subsequent encounter for closed fracture with routine healing: Secondary | ICD-10-CM | POA: Diagnosis not present

## 2014-11-06 DIAGNOSIS — M47817 Spondylosis without myelopathy or radiculopathy, lumbosacral region: Secondary | ICD-10-CM | POA: Diagnosis not present

## 2014-11-06 DIAGNOSIS — M9905 Segmental and somatic dysfunction of pelvic region: Secondary | ICD-10-CM | POA: Diagnosis not present

## 2014-11-06 DIAGNOSIS — M545 Low back pain: Secondary | ICD-10-CM | POA: Diagnosis not present

## 2014-11-06 DIAGNOSIS — M9904 Segmental and somatic dysfunction of sacral region: Secondary | ICD-10-CM | POA: Diagnosis not present

## 2014-11-06 DIAGNOSIS — M9903 Segmental and somatic dysfunction of lumbar region: Secondary | ICD-10-CM | POA: Diagnosis not present

## 2014-11-06 DIAGNOSIS — M791 Myalgia: Secondary | ICD-10-CM | POA: Diagnosis not present

## 2014-12-04 ENCOUNTER — Other Ambulatory Visit: Payer: Self-pay | Admitting: Orthopedic Surgery

## 2014-12-04 DIAGNOSIS — M25521 Pain in right elbow: Secondary | ICD-10-CM | POA: Diagnosis not present

## 2014-12-06 ENCOUNTER — Ambulatory Visit
Admission: RE | Admit: 2014-12-06 | Discharge: 2014-12-06 | Disposition: A | Discharge status: Home / Self Care | Payer: Medicare Other | Source: Ambulatory Visit | Attending: Orthopedic Surgery | Admitting: Orthopedic Surgery

## 2014-12-06 DIAGNOSIS — M19121 Post-traumatic osteoarthritis, right elbow: Secondary | ICD-10-CM | POA: Diagnosis not present

## 2014-12-06 DIAGNOSIS — M25521 Pain in right elbow: Secondary | ICD-10-CM

## 2014-12-06 DIAGNOSIS — Z8781 Personal history of (healed) traumatic fracture: Secondary | ICD-10-CM | POA: Diagnosis not present

## 2014-12-06 DIAGNOSIS — L814 Other melanin hyperpigmentation: Secondary | ICD-10-CM | POA: Diagnosis not present

## 2014-12-11 ENCOUNTER — Encounter: Payer: Self-pay | Admitting: Internal Medicine

## 2014-12-11 ENCOUNTER — Ambulatory Visit (INDEPENDENT_AMBULATORY_CARE_PROVIDER_SITE_OTHER): Payer: Medicare Other | Admitting: Internal Medicine

## 2014-12-11 VITALS — BP 160/80 | Temp 97.8°F | Ht 63.0 in | Wt 206.9 lb

## 2014-12-11 DIAGNOSIS — I499 Cardiac arrhythmia, unspecified: Secondary | ICD-10-CM

## 2014-12-11 DIAGNOSIS — I10 Essential (primary) hypertension: Secondary | ICD-10-CM | POA: Diagnosis not present

## 2014-12-11 DIAGNOSIS — R8290 Unspecified abnormal findings in urine: Secondary | ICD-10-CM | POA: Diagnosis not present

## 2014-12-11 DIAGNOSIS — I498 Other specified cardiac arrhythmias: Secondary | ICD-10-CM

## 2014-12-11 DIAGNOSIS — R05 Cough: Secondary | ICD-10-CM

## 2014-12-11 DIAGNOSIS — R059 Cough, unspecified: Secondary | ICD-10-CM

## 2014-12-11 DIAGNOSIS — R8299 Other abnormal findings in urine: Secondary | ICD-10-CM | POA: Diagnosis not present

## 2014-12-11 LAB — URINALYSIS, ROUTINE W REFLEX MICROSCOPIC
BILIRUBIN URINE: NEGATIVE
HGB URINE DIPSTICK: NEGATIVE
Ketones, ur: NEGATIVE
Nitrite: NEGATIVE
Specific Gravity, Urine: <=1.005 — AB (ref 1.000–1.030)
TOTAL PROTEIN, URINE-UPE24: NEGATIVE
Urine Glucose: NEGATIVE
Urobilinogen, UA: 0.2 (ref 0.0–1.0)
pH: 6 (ref 5.0–8.0)

## 2014-12-11 MED ORDER — VALSARTAN 80 MG PO TABS: 80.0000 mg | ORAL_TABLET | Freq: Every day | ORAL | Status: DC

## 2014-12-11 NOTE — Patient Instructions (Signed)
Because of possible side effects  changing blood pressure medication .  To one less lile ly to cause cough .  Continue monitoring bp  . Urine tests today . ROV in 2-3 months  With 10 days of readings  Before return . Contact us if  bp readings 160 and above out of office readings.

## 2014-12-11 NOTE — Progress Notes (Signed)
Pre visit review using our clinic review tool, if applicable. No additional management support is needed unless otherwise documented below in the visit note.  Chief Complaint  Patient presents with  . Follow-up    HPI: Marie Kelly 69 y.o.  comes in for chronic disease/ medication management   Fu elevaetd bp  And adding medication tolerating medication well although is developing a nagging cough that she didn't have before taking the medication. Her blood pressure readings at home began at the 1 5180 level are now down to 1:30 12/16/1939 08/15/2017 135 level.  She is to have repeat surgery to have the pins taken out or the hardware taken out of her right arm tomorrow having continuing pain. Occasional narcotic otherwise Tylenol.  Did not get the repeat urine in the urine for protein when she was here last. Asks about getting it today. ROS: See pertinent positives and negatives per HPI.no cp sob cough as above  No new rash   Past Medical History  Diagnosis Date  . Colon polyps   . GERD (gastroesophageal reflux disease)     had endo? taking nexium 3 x per week  . Diverticulosis   . Hyperlipidemia     had been on lipitor changes to pravachol cause of cost and tricor has since stopped  NO  se reported   . Anemia   . Impaired fasting glucose   . Hypothyroidism      on meds for years  . Asthma   . Olecranon fracture 10/25/2013    right arm  . Ventricular bigeminy seen on cardiac monitor     Cardiac work up Dr Stanford Breed [facility]  . Fracture of right olecranon process 10/26/2013  . Wears glasses   . Wears dentures     top-partial bottom  . Complication of anesthesia     2 days after surgery-muscles hurtand spasmed-needed a muscle relaxant   . 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 Stroke Mother   . Diabetes Father   . Alzheimer's disease    . Stomach cancer Maternal Aunt   . Colon  cancer Neg Hx   . Pancreatic cancer Neg Hx   . Heart disease Father     History   Social History  . Marital Status: Married    Spouse Name: N/A  . Number of Children: 3  . Years of Education: N/A   Social History Main Topics  . Smoking status: Former Smoker    Quit date: 10/25/1973  . Smokeless tobacco: Never Used  . Alcohol Use: Yes     Comment: socially  . Drug Use: No  . Sexual Activity: Not on file   Other Topics Concern  . 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    Gravida 3 para 3   Last td 6 2008   HS educated       REmote hx of tobacco 1.5 ppd for 6 years as a teen Q 32    Outpatient Prescriptions Prior to Visit  Medication Sig Dispense Refill  . Fluticasone-Salmeterol (ADVAIR) 250-50 MCG/DOSE AEPB Inhale 1 puff into the lungs every 12 (twelve) hours.    Marland Kitchen  levothyroxine (SYNTHROID, LEVOTHROID) 50 MCG tablet Take 1 tablet (50 mcg total) by mouth daily. 90 tablet 3  . montelukast (SINGULAIR) 10 MG tablet Take 1 tablet (10 mg total) by mouth at bedtime. 90 tablet 3  . tolterodine (DETROL LA) 2 MG 24 hr capsule Take 1 capsule (2 mg total) by mouth daily. 90 capsule 3  . lisinopril (PRINIVIL,ZESTRIL) 10 MG tablet Take 1 tablet (10 mg total) by mouth daily. 90 tablet 1   No facility-administered medications prior to visit.     EXAM:  BP 160/80 mmHg  Temp(Src) 97.8 F (36.6 C) (Oral)  Ht 5\' 3"  (1.6 m)  Wt 206 lb 14.4 oz (93.849 kg)  BMI 36.66 kg/m2  Body mass index is 36.66 kg/(m^2).  GENERAL: vitals reviewed and listed above, alert, oriented, appears well hydrated and in no acute distress HEENT: atraumatic, conjunctiva  clear, no obvious abnormalities on inspection of external nose and ears NECK: no obvious masses on inspection palpation  LUNGS: clear to auscultation bilaterally, no wheezes,  rales or rhonchi, good air movement occasional dry cough noted no wheezing no respiratory distress. CV: HRRR, no clubbing cyanosis or  peripheral edema nl cap refill  MS: right arm in a sling  support . Ambulatory  PSYCH: pleasant and cooperative, no obvious depression or anxiety Lab Results  Component Value Date   WBC 8.1 09/18/2014   HGB 13.5 09/18/2014   HCT 40.1 09/18/2014   PLT 202.0 09/18/2014   GLUCOSE 93 09/18/2014   CHOL 242* 09/18/2014   TRIG 158.0* 09/18/2014   HDL 45.50 09/18/2014   LDLDIRECT 168.5 04/23/2013   LDLCALC 165* 09/18/2014   ALT 30 09/18/2014   AST 21 09/18/2014   NA 138 09/18/2014   K 4.1 09/18/2014   CL 102 09/18/2014   CREATININE 0.91 09/18/2014   BUN 15 09/18/2014   CO2 27 09/18/2014   TSH 3.40 09/18/2014   HGBA1C 5.7 09/18/2014    ASSESSMENT AND PLAN:  Discussed the following assessment and plan:  Essential hypertension - White coat readings with more elevation improved on ACEI still needs more intensification can start after her surgery.  Abnormal urinalysis - Plan: Urinalysis with Reflex Microscopic, Protein / Creatinine Ratio, Urine  Bigeminy  Cough - Seems fairly minor but probably from medication based on context and was experience will probably persist since changing dose anyway will switch to an ARB  -Patient advised to return or notify health care team  if symptoms worsen ,persist or new concerns arise.  Patient Instructions  Because of possible side effects  changing blood pressure medication .  To one less lile ly to cause cough .  Continue monitoring bp  . Urine tests today . ROV in 2-3 months  With 10 days of readings  Before return . Contact us if  bp readings 160 and above out of office readings.     Standley Brooking. Panosh M.D.

## 2014-12-12 DIAGNOSIS — G8918 Other acute postprocedural pain: Secondary | ICD-10-CM | POA: Diagnosis not present

## 2014-12-12 DIAGNOSIS — T84498A Other mechanical complication of other internal orthopedic devices, implants and grafts, initial encounter: Secondary | ICD-10-CM | POA: Diagnosis not present

## 2014-12-12 LAB — PROTEIN / CREATININE RATIO, URINE: CREATININE, URINE: 33.4 mg/dL

## 2014-12-19 DIAGNOSIS — T84498A Other mechanical complication of other internal orthopedic devices, implants and grafts, initial encounter: Secondary | ICD-10-CM | POA: Diagnosis not present

## 2015-01-01 DIAGNOSIS — H401232 Low-tension glaucoma, bilateral, moderate stage: Secondary | ICD-10-CM | POA: Diagnosis not present

## 2015-01-01 DIAGNOSIS — H25813 Combined forms of age-related cataract, bilateral: Secondary | ICD-10-CM | POA: Diagnosis not present

## 2015-01-01 DIAGNOSIS — H35373 Puckering of macula, bilateral: Secondary | ICD-10-CM | POA: Diagnosis not present

## 2015-01-01 DIAGNOSIS — H47323 Drusen of optic disc, bilateral: Secondary | ICD-10-CM | POA: Diagnosis not present

## 2015-01-09 DIAGNOSIS — H35341 Macular cyst, hole, or pseudohole, right eye: Secondary | ICD-10-CM | POA: Diagnosis not present

## 2015-01-09 DIAGNOSIS — H43813 Vitreous degeneration, bilateral: Secondary | ICD-10-CM | POA: Diagnosis not present

## 2015-01-09 DIAGNOSIS — H35373 Puckering of macula, bilateral: Secondary | ICD-10-CM | POA: Diagnosis not present

## 2015-01-24 DIAGNOSIS — M9905 Segmental and somatic dysfunction of pelvic region: Secondary | ICD-10-CM | POA: Diagnosis not present

## 2015-01-24 DIAGNOSIS — M545 Low back pain: Secondary | ICD-10-CM | POA: Diagnosis not present

## 2015-01-24 DIAGNOSIS — M47817 Spondylosis without myelopathy or radiculopathy, lumbosacral region: Secondary | ICD-10-CM | POA: Diagnosis not present

## 2015-01-24 DIAGNOSIS — M9903 Segmental and somatic dysfunction of lumbar region: Secondary | ICD-10-CM | POA: Diagnosis not present

## 2015-01-24 DIAGNOSIS — M791 Myalgia: Secondary | ICD-10-CM | POA: Diagnosis not present

## 2015-01-24 DIAGNOSIS — M9904 Segmental and somatic dysfunction of sacral region: Secondary | ICD-10-CM | POA: Diagnosis not present

## 2015-01-29 ENCOUNTER — Encounter: Payer: Self-pay | Admitting: Adult Health

## 2015-01-29 ENCOUNTER — Ambulatory Visit (INDEPENDENT_AMBULATORY_CARE_PROVIDER_SITE_OTHER): Payer: Medicare Other | Admitting: Adult Health

## 2015-01-29 DIAGNOSIS — M546 Pain in thoracic spine: Secondary | ICD-10-CM | POA: Diagnosis not present

## 2015-01-29 DIAGNOSIS — M9905 Segmental and somatic dysfunction of pelvic region: Secondary | ICD-10-CM | POA: Diagnosis not present

## 2015-01-29 DIAGNOSIS — M9904 Segmental and somatic dysfunction of sacral region: Secondary | ICD-10-CM | POA: Diagnosis not present

## 2015-01-29 DIAGNOSIS — M47817 Spondylosis without myelopathy or radiculopathy, lumbosacral region: Secondary | ICD-10-CM | POA: Diagnosis not present

## 2015-01-29 DIAGNOSIS — M9903 Segmental and somatic dysfunction of lumbar region: Secondary | ICD-10-CM | POA: Diagnosis not present

## 2015-01-29 DIAGNOSIS — M791 Myalgia: Secondary | ICD-10-CM | POA: Diagnosis not present

## 2015-01-29 DIAGNOSIS — M545 Low back pain: Secondary | ICD-10-CM | POA: Diagnosis not present

## 2015-01-29 MED ORDER — TIZANIDINE HCL 4 MG PO TABS: 4.0000 mg | ORAL_TABLET | Freq: Four times a day (QID) | ORAL | Status: DC | PRN

## 2015-01-29 MED ORDER — KETOROLAC TROMETHAMINE 60 MG/2ML IM SOLN
60.0000 mg | Freq: Once | INTRAMUSCULAR | Status: AC
  Administered 2015-01-29: 60 mg via INTRAMUSCULAR

## 2015-01-29 MED ORDER — KETOROLAC TROMETHAMINE 60 MG/2ML IM SOLN: 60.0000 mg | Freq: Once | INTRAMUSCULAR | Status: DC

## 2015-01-29 MED ORDER — TRAMADOL HCL 50 MG PO TABS: 50.0000 mg | ORAL_TABLET | Freq: Four times a day (QID) | ORAL | Status: DC | PRN

## 2015-01-29 NOTE — Progress Notes (Signed)
Subjective:    Patient ID: Marie Kelly, female    DOB: 1945-12-11, 69 y.o.   MRN: [id number]  HPI  69 year old female, patient of [name], who I am seeing in her absence. She presents to the office today for pain in her back that has been getting worse over the past week. She had been playing with her grandchildren ( ages 38 and 9 ) she was picking them up and playing with them at the playground the few days prior to her back hurting. The back pain is in the thoracicic and lumbar spine area. Pain is worse when changing positions, bending over, walking, twisting, and laying down. The only time it feels better is when she goes to the chiropractor. Pain is   She has been using Tylenol and Baclofen that she had left over from a previous surgery, which she does not endorse working well.   Her chiropractor would like her to have x rays of her back.   Review of Systems  Constitutional: Negative.   Respiratory: Negative.   Cardiovascular: Negative.   Musculoskeletal: Positive for myalgias and back pain. Negative for joint swelling, arthralgias, gait problem, neck pain and neck stiffness.  Neurological: Negative.   All other systems reviewed and are negative.  Past Medical History  Diagnosis Date  . Colon polyps   . GERD (gastroesophageal reflux disease)     had endo? taking nexium 3 x per week  . Diverticulosis   . Hyperlipidemia     had been on lipitor changes to pravachol cause of cost and tricor has since stopped  NO  se reported   . Anemia   . Impaired fasting glucose   . Hypothyroidism      on meds for years  . Asthma   . Olecranon fracture 10/25/2013    right arm  . Ventricular bigeminy seen on cardiac monitor     Cardiac work up Dr Stanford Breed [facility]  . Fracture of right olecranon process 10/26/2013  . Wears glasses   . Wears dentures     top-partial bottom  . Complication of anesthesia     2 days after surgery-muscles hurtand spasmed-needed a muscle relaxant   .  Closed disp fx of right olecranon with intraartic exten w nonunion 04/19/2014    Social History   Social History  . Marital Status: Married    Spouse Name: N/A  . Number of Children: 3  . Years of Education: N/A   Occupational History  . Not on file.   Social History Main Topics  . Smoking status: Former Smoker    Quit date: 10/25/1973  . Smokeless tobacco: Never Used  . Alcohol Use: Yes     Comment: socially  . Drug Use: No  . Sexual Activity: Not on file   Other Topics Concern  . 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    Gravida 3 para 3   Last td 6 2008   HS educated       REmote hx of tobacco 1.5 ppd for 6 years as a teen Q 41    Past Surgical History  Procedure Laterality Date  . Tubal ligation    . Cholecystectomy    .  Uterus repair    . Hemorroidectomy    . Tonsillectomy    . Belpharoptosis repair Bilateral   . Orif elbow fracture Right 10/26/2013    Procedure: RIGHT ELBOW FRACTUE OPEN TREATMENT ULNAR PROXIMAL END OLECRANON PROCESS INCLUDES INTERNAL FIXATION;  Surgeon: Johnny Bridge, MD;  Location: [place];  Service: Orthopedics;  Laterality: Right;  . Colonoscopy    . Orif elbow fracture Right 04/19/2014    Procedure: OPEN REDUCTION INTERNAL FIXATION (ORIF) RIGHT ELBOW/OLECRANON NONUNION FRACTURE WITH HARDWARE REMOVAL;  Surgeon: Johnny Bridge, MD;  Location: [place];  Service: Orthopedics;  Laterality: Right;  . Harvest bone graft Right 04/19/2014    Procedure: HARVEST LEFT TIBIA BONE GRAFT ;  Surgeon: Johnny Bridge, MD;  Location: [place];  Service: Orthopedics;  Laterality: Right;    Family History  Problem Relation Age of Onset  . Stroke Mother   . Diabetes Father   . Alzheimer's disease    . Stomach cancer Maternal Aunt   .  Colon cancer Neg Hx   . Pancreatic cancer Neg Hx   . Heart disease Father     Allergies  Allergen Reactions  . Hydrochlorothiazide     Muscles tightness and felt like she was hit by a truck    Current Outpatient Prescriptions on File Prior to Visit  Medication Sig Dispense Refill  . Fluticasone-Salmeterol (ADVAIR) 250-50 MCG/DOSE AEPB Inhale 1 puff into the lungs every 12 (twelve) hours.    Marland Kitchen levothyroxine (SYNTHROID, LEVOTHROID) 50 MCG tablet Take 1 tablet (50 mcg total) by mouth daily. 90 tablet 3  . montelukast (SINGULAIR) 10 MG tablet Take 1 tablet (10 mg total) by mouth at bedtime. 90 tablet 3  . tolterodine (DETROL LA) 2 MG 24 hr capsule Take 1 capsule (2 mg total) by mouth daily. 90 capsule 3  . valsartan (DIOVAN) 80 MG tablet Take 1 tablet (80 mg total) by mouth daily. 30 tablet 3   No current facility-administered medications on file prior to visit.    There were no vitals taken for this visit.       Objective:   Physical Exam  Constitutional: She is oriented to person, place, and time. She appears well-developed and well-nourished. No distress.  Cardiovascular: Normal rate, regular rhythm, normal heart sounds and intact distal pulses.  Exam reveals no gallop and no friction rub.   No murmur heard. Pulmonary/Chest: Effort normal and breath sounds normal. No respiratory distress. She has no wheezes. She has no rales. She exhibits no tenderness.  Musculoskeletal: Normal range of motion. She exhibits no edema or tenderness.  No tenderness with palpation.  No signs of trauma No redness, warmth or bruising  Neurological: She is alert and oriented to person, place, and time.  Skin: Skin is warm and dry. No rash noted. She is not diaphoretic. No erythema. No pallor.  Psychiatric: She has a normal mood and affect. Her behavior is normal. Judgment and thought content normal.  Nursing note and vitals reviewed.      Assessment & Plan:  1. Bilateral thoracic back pain -  traMADol (ULTRAM) 50 MG tablet; Take 1 tablet (50 mg total) by mouth every 6 (six) hours as needed.  Dispense: 30 tablet; Refill: 0 - tiZANidine (ZANAFLEX) 4 MG tablet; Take 1 tablet (4 mg total) by mouth every 6 (six) hours as needed for muscle spasms.  Dispense: 30 tablet; Refill: 0 - DG Lumbar Spine Complete; Future - DG Thoracic Spine W/Swimmers;  Future - ketorolac (TORADOL) injection 60 mg; Inject 2 mLs (60 mg total) into the muscle once. - Ice/heat/ibuprofen 600mg  Q8H PRN/ rest - Follow up if no improvement in 2-3 days.

## 2015-01-29 NOTE — Patient Instructions (Signed)
It was great meeting you today, I am sorry you are in so much pain.   Take the prescribed medications as directed. Take ibuprofen 600mg  as needed every 8 hours  I have placed orders for x rays of your back. You can go over there today or tomorrow morning.   Please alternate heat and ice on the area that hurts. Rest for the next few days.

## 2015-01-30 ENCOUNTER — Encounter: Payer: Self-pay | Admitting: Adult Health

## 2015-01-30 ENCOUNTER — Telehealth: Payer: Self-pay | Admitting: Adult Health

## 2015-01-30 ENCOUNTER — Ambulatory Visit (INDEPENDENT_AMBULATORY_CARE_PROVIDER_SITE_OTHER)
Admission: RE | Admit: 2015-01-30 | Discharge: 2015-01-30 | Disposition: A | Discharge status: Home / Self Care | Payer: Medicare Other | Source: Ambulatory Visit | Attending: Adult Health | Admitting: Adult Health

## 2015-01-30 DIAGNOSIS — M546 Pain in thoracic spine: Secondary | ICD-10-CM | POA: Diagnosis not present

## 2015-01-30 DIAGNOSIS — M549 Dorsalgia, unspecified: Secondary | ICD-10-CM | POA: Diagnosis not present

## 2015-01-30 NOTE — Telephone Encounter (Signed)
Call from  X-ray, patient is there for her x rays that were ordered. She is no longer having back pain. Ok to cancel

## 2015-02-05 DIAGNOSIS — M9904 Segmental and somatic dysfunction of sacral region: Secondary | ICD-10-CM | POA: Diagnosis not present

## 2015-02-05 DIAGNOSIS — M545 Low back pain: Secondary | ICD-10-CM | POA: Diagnosis not present

## 2015-02-05 DIAGNOSIS — M9905 Segmental and somatic dysfunction of pelvic region: Secondary | ICD-10-CM | POA: Diagnosis not present

## 2015-02-05 DIAGNOSIS — M47817 Spondylosis without myelopathy or radiculopathy, lumbosacral region: Secondary | ICD-10-CM | POA: Diagnosis not present

## 2015-02-05 DIAGNOSIS — M9903 Segmental and somatic dysfunction of lumbar region: Secondary | ICD-10-CM | POA: Diagnosis not present

## 2015-02-05 DIAGNOSIS — M791 Myalgia: Secondary | ICD-10-CM | POA: Diagnosis not present

## 2015-02-08 DIAGNOSIS — S335XXA Sprain of ligaments of lumbar spine, initial encounter: Secondary | ICD-10-CM | POA: Diagnosis not present

## 2015-02-12 DIAGNOSIS — M9903 Segmental and somatic dysfunction of lumbar region: Secondary | ICD-10-CM | POA: Diagnosis not present

## 2015-02-12 DIAGNOSIS — M545 Low back pain: Secondary | ICD-10-CM | POA: Diagnosis not present

## 2015-02-12 DIAGNOSIS — M9904 Segmental and somatic dysfunction of sacral region: Secondary | ICD-10-CM | POA: Diagnosis not present

## 2015-02-12 DIAGNOSIS — M9905 Segmental and somatic dysfunction of pelvic region: Secondary | ICD-10-CM | POA: Diagnosis not present

## 2015-02-12 DIAGNOSIS — M47817 Spondylosis without myelopathy or radiculopathy, lumbosacral region: Secondary | ICD-10-CM | POA: Diagnosis not present

## 2015-02-12 DIAGNOSIS — M791 Myalgia: Secondary | ICD-10-CM | POA: Diagnosis not present

## 2015-02-26 DIAGNOSIS — M9905 Segmental and somatic dysfunction of pelvic region: Secondary | ICD-10-CM | POA: Diagnosis not present

## 2015-02-26 DIAGNOSIS — M9903 Segmental and somatic dysfunction of lumbar region: Secondary | ICD-10-CM | POA: Diagnosis not present

## 2015-02-26 DIAGNOSIS — M545 Low back pain: Secondary | ICD-10-CM | POA: Diagnosis not present

## 2015-02-26 DIAGNOSIS — M791 Myalgia: Secondary | ICD-10-CM | POA: Diagnosis not present

## 2015-02-26 DIAGNOSIS — M9904 Segmental and somatic dysfunction of sacral region: Secondary | ICD-10-CM | POA: Diagnosis not present

## 2015-02-26 DIAGNOSIS — M47817 Spondylosis without myelopathy or radiculopathy, lumbosacral region: Secondary | ICD-10-CM | POA: Diagnosis not present

## 2015-03-10 DIAGNOSIS — Z23 Encounter for immunization: Secondary | ICD-10-CM | POA: Diagnosis not present

## 2015-03-19 DIAGNOSIS — M545 Low back pain: Secondary | ICD-10-CM | POA: Diagnosis not present

## 2015-03-19 DIAGNOSIS — M791 Myalgia: Secondary | ICD-10-CM | POA: Diagnosis not present

## 2015-03-19 DIAGNOSIS — M9905 Segmental and somatic dysfunction of pelvic region: Secondary | ICD-10-CM | POA: Diagnosis not present

## 2015-03-19 DIAGNOSIS — M9903 Segmental and somatic dysfunction of lumbar region: Secondary | ICD-10-CM | POA: Diagnosis not present

## 2015-03-19 DIAGNOSIS — M47817 Spondylosis without myelopathy or radiculopathy, lumbosacral region: Secondary | ICD-10-CM | POA: Diagnosis not present

## 2015-03-19 DIAGNOSIS — M9904 Segmental and somatic dysfunction of sacral region: Secondary | ICD-10-CM | POA: Diagnosis not present

## 2015-04-14 ENCOUNTER — Other Ambulatory Visit: Payer: Self-pay | Admitting: Family Medicine

## 2015-04-15 ENCOUNTER — Other Ambulatory Visit: Payer: Self-pay | Admitting: Internal Medicine

## 2015-04-17 ENCOUNTER — Telehealth: Payer: Self-pay | Admitting: Family Medicine

## 2015-04-17 MED ORDER — VALSARTAN 80 MG PO TABS: 80.0000 mg | ORAL_TABLET | Freq: Every day | ORAL | Status: DC

## 2015-04-17 NOTE — Telephone Encounter (Signed)
Duplicate Request.  Sent in earlier today.  This request was denied.  Other request approved for 30 days since pt is past due for appt.

## 2015-04-17 NOTE — Telephone Encounter (Signed)
Pt is past due for her blood pressure check with [facility].  Please contact the pt for an appointment.  Thanks!

## 2015-04-17 NOTE — Telephone Encounter (Signed)
Sent to the pharmacy by e-scribe for 30 days.  Pt is now past due for her bp follow up.  Message sent to scheduling.

## 2015-04-18 NOTE — Telephone Encounter (Signed)
Pt has an appt on 04-22-15

## 2015-04-22 ENCOUNTER — Telehealth: Payer: Self-pay | Admitting: Internal Medicine

## 2015-04-22 ENCOUNTER — Encounter: Payer: Medicare Other | Admitting: Internal Medicine

## 2015-04-22 NOTE — Progress Notes (Signed)
Document opened and reviewed for OV but appt  canceled same day . Moved appt

## 2015-04-30 NOTE — Telephone Encounter (Signed)
Error

## 2015-05-07 ENCOUNTER — Encounter: Payer: Self-pay | Admitting: Family Medicine

## 2015-05-07 ENCOUNTER — Ambulatory Visit (INDEPENDENT_AMBULATORY_CARE_PROVIDER_SITE_OTHER): Payer: Medicare Other | Admitting: Family Medicine

## 2015-05-07 VITALS — BP 160/78 | HR 42 | Temp 97.6°F | Ht 63.0 in | Wt 216.4 lb

## 2015-05-07 DIAGNOSIS — I10 Essential (primary) hypertension: Secondary | ICD-10-CM

## 2015-05-07 MED ORDER — CARVEDILOL 3.125 MG PO TABS: 3.1250 mg | ORAL_TABLET | Freq: Two times a day (BID) | ORAL | Status: DC

## 2015-05-07 MED ORDER — VALSARTAN 160 MG PO TABS: 160.0000 mg | ORAL_TABLET | Freq: Every day | ORAL | Status: DC

## 2015-05-07 NOTE — Progress Notes (Signed)
   Subjective:    Patient ID: Marie Kelly, female    DOB: Jun 20, 1945, 69 y.o.   MRN: [id number]  HPI Here for elevated BP readings. She was seen here in July with elevated BP readings, and she was switched from Lisinopril to Valsartan 80 mg a day. Over the past few months I see from her chart that the systolic readings have been in the 150s or 160s consistently. She has a BP monitor at home but had not been using it much, but she used it early this morning because she was feeling a little lightheaded. No chest pain or SOB or HA. She had one reading at home of 198/80 and another of 215/79. She walked into our clinic this morning and spoke to a triage nurse. Her BP at that time was 160/82 and she felt fine. We advised her to go home and take an additional Valsartan, which she did. Now we are seeing her in the late afternoon and she feels fine. Of note she saw Cardiology last year to evaluate frequent PVCs and some bigeminy. Her exam was otherwise normal and an ECHO showed normal LV function. She had labs this May showing normal renal function.    Review of Systems  Constitutional: Negative.   Respiratory: Negative.   Cardiovascular: Negative.   Neurological: Negative.        Objective:   Physical Exam  Constitutional: She is oriented to person, place, and time. She appears well-developed and well-nourished. No distress.  Cardiovascular: Normal rate, normal heart sounds and intact distal pulses.   No murmur heard. Frequent ectopic beats   Pulmonary/Chest: Effort normal and breath sounds normal. No respiratory distress. She has no wheezes. She has no rales.  Musculoskeletal: She exhibits no edema.  Neurological: She is alert and oriented to person, place, and time.          Assessment & Plan:  HTN with elevated readings. We will increase the Valsartan to 160 mg daily and add Carvedilol 3.125 mg bid. She is scheduled to see [name] on 05-20-15 and I urged her to keep this appt. In the  meantime she will let us know if there are problems. She will monitor the BP at home.

## 2015-05-07 NOTE — Progress Notes (Signed)
Pre visit review using our clinic review tool, if applicable. No additional management support is needed unless otherwise documented below in the visit note. 

## 2015-05-20 ENCOUNTER — Ambulatory Visit (INDEPENDENT_AMBULATORY_CARE_PROVIDER_SITE_OTHER): Payer: Medicare Other | Admitting: Internal Medicine

## 2015-05-20 ENCOUNTER — Encounter: Payer: Self-pay | Admitting: Internal Medicine

## 2015-05-20 VITALS — BP 148/78 | Temp 97.7°F | Ht 63.0 in | Wt 221.3 lb

## 2015-05-20 DIAGNOSIS — I10 Essential (primary) hypertension: Secondary | ICD-10-CM | POA: Diagnosis not present

## 2015-05-20 MED ORDER — CARVEDILOL 6.25 MG PO TABS: 6.2500 mg | ORAL_TABLET | Freq: Two times a day (BID) | ORAL | Status: DC

## 2015-05-20 NOTE — Progress Notes (Signed)
Pre visit review using our clinic review tool, if applicable. No additional management support is needed unless otherwise documented below in the visit note.  Chief Complaint  Patient presents with  . Follow-up  . Hypertension    HPI: Marie Kelly 70 y.o. comes in for fu of ht management she saw [name] in my absence on December 20 or thereabouts. At that time her blood pressures had gone up into the 170s and had a reading in the 190s and 200s. Her valsartan was increased to 120 and carvedilol at the law added see below  HTN with elevated readings. We will increase the Valsartan to 160 mg daily and add Carvedilol 3.125 mg bid. She is scheduled to see [name] on 05-20-15 and I urged her to keep this appt. In the meantime she will let us know if there are problems. She will monitor the BP at home.            Since that time her readings have come down some 150s 140s occasional 0000000 but her diastolics of gone up to the 100s her pulses sometimes reading 38-50 but she does have bigeminy and her blood pressure machine reads error a lot. She is a bit concerned about why her readings are high. She has had right elbow surgery in December and has had some pain but only taking Tylenol. ROS: See pertinent positives and negatives per HPI.  Past Medical History  Diagnosis Date  . Colon polyps   . GERD (gastroesophageal reflux disease)     had endo? taking nexium 3 x per week  . Diverticulosis   . Hyperlipidemia     had been on lipitor changes to pravachol cause of cost and tricor has since stopped  NO  se reported   . Anemia   . Impaired fasting glucose   . Hypothyroidism      on meds for years  . Asthma   . Olecranon fracture 10/25/2013    right arm  . Ventricular bigeminy seen on cardiac monitor     Cardiac work up Dr Stanford Breed [facility]  . Fracture of right olecranon process 10/26/2013  . Wears glasses   . Wears dentures     top-partial bottom  . Complication of anesthesia     2  days after surgery-muscles hurtand spasmed-needed a muscle relaxant   . 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 Stroke Mother   . Diabetes Father   . Alzheimer's disease    . Stomach cancer Maternal Aunt   . Colon cancer Neg Hx   . Pancreatic cancer Neg Hx   . Heart disease Father     Social History   Social History  . Marital Status: Married    Spouse Name: N/A  . Number of Children: 3  . Years of Education: N/A   Social History Main Topics  . Smoking status: Former Smoker    Quit date: 10/25/1973  . Smokeless tobacco: Never Used  . Alcohol Use: 0.0 oz/week    0 Standard drinks or equivalent per week     Comment: socially  . Drug Use: No  . Sexual Activity: Not Asked   Other Topics Concern  . 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    Gravida 3 para 3   Last td 6 2008   HS educated       REmote hx of tobacco 1.5 ppd for 6 years as a teen Q 71    Outpatient Prescriptions Prior to Visit  Medication Sig Dispense Refill  . carvedilol (COREG) 3.125 MG tablet Take 1 tablet (3.125 mg total) by mouth 2 (two) times daily with a meal. 60 tablet 3  . levothyroxine (SYNTHROID, LEVOTHROID) 50 MCG tablet Take 1 tablet (50 mcg total) by mouth daily. 90 tablet 3  . montelukast (SINGULAIR) 10 MG tablet Take 1 tablet (10 mg total) by mouth at bedtime. 90 tablet 3  . tiZANidine (ZANAFLEX) 4 MG tablet Take 1 tablet (4 mg total) by mouth every 6 (six) hours as needed for muscle spasms. 30 tablet 0  . tolterodine (DETROL LA) 2 MG 24 hr capsule Take 1 capsule (2 mg total) by mouth daily. 90 capsule 3  . valsartan (DIOVAN) 160 MG tablet Take 1 tablet (160 mg total) by mouth daily. 30 tablet 2  . Fluticasone-Salmeterol (ADVAIR) 250-50  MCG/DOSE AEPB Inhale 1 puff into the lungs every 12 (twelve) hours. Reported on 05/20/2015    . traMADol (ULTRAM) 50 MG tablet Take 1 tablet (50 mg total) by mouth every 6 (six) hours as needed. (Patient not taking: Reported on 05/07/2015) 30 tablet 0   No facility-administered medications prior to visit.     EXAM:  BP 148/78 mmHg  Temp(Src) 97.7 F (36.5 C) (Oral)  Ht 5\' 3"  (1.6 m)  Wt 221 lb 4.8 oz (100.381 kg)  BMI 39.21 kg/m2  Body mass index is 39.21 kg/(m^2). Sugar checked on the right irregular heartbeat. GENERAL: vitals reviewed and listed above, alert, oriented, appears well hydrated and in no acute distress HEENT: atraumatic, conjunctiva  clear, no obvious abnormalities on inspection of external nose and earsNECK: no obvious masses on inspection palpation  LUNGS: clear to auscultation bilaterally, no wheezes, rales or rhonchi,  CV: HRRR, no clubbing cyanosis or  peripheral edema nl cap refill  MS: moves all extremities without noticeable focal  abnormality PSYCH: pleasant and cooperative, no obvious depression or anxiety Lab Results  Component Value Date   WBC 8.1 09/18/2014   HGB 13.5 09/18/2014   HCT 40.1 09/18/2014   PLT 202.0 09/18/2014   GLUCOSE 93 09/18/2014   CHOL 242* 09/18/2014   TRIG 158.0* 09/18/2014   HDL 45.50 09/18/2014   LDLDIRECT 168.5 04/23/2013   LDLCALC 165* 09/18/2014   ALT 30 09/18/2014   AST 21 09/18/2014   NA 138 09/18/2014   K 4.1 09/18/2014   CL 102 09/18/2014   CREATININE 0.91 09/18/2014   BUN 15 09/18/2014   CO2 27 09/18/2014   TSH 3.40 09/18/2014   HGBA1C 5.7 09/18/2014   repeat blood pressure by me was 150/70. Reviewed bp logs ASSESSMENT AND PLAN:  Discussed the following assessment and plan:  Essential hypertension Discussed with patient how to take pulse I suspect her machine is not picking up full pulse rate with her bigeminy is causing an air reading. Indeed if her pulse rate is on the low side and she feels bad he to be  notified. I cannot say she has findings of secondary hypertension but needs follow-up. Continue same medicines to see if increasing better control however if not at goal in a few weeks she should increase the carvedilol. She did  have initial sleep disturbance when she started this medicine but it's better now and she has had no asthmatic flares. -Patient advised to return or notify health care team  if symptoms worsen ,persist or new concerns arise. Total visit 70mins > 50% spent counseling and coordinating care as indicated in above note and in instructions to patient .     Patient Instructions   Check BP  and pulse  Only 2 days of the week .  Contact us if pulse below 50  ( but would need to count for 60 seconds )  If readings not continue to come down in the next 3 weeks then we may increase the  Medication dosing depending on your heart rate and how you are doing.  Could increase to    Coreg 6.25  Twice a day .   Agree with   Trying weight watchers also .    Wt Readings from Last 3 Encounters:  05/20/15 221 lb 4.8 oz (100.381 kg)  05/07/15 216 lb 6.4 oz (98.158 kg)  12/11/14 206 lb 14.4 oz (93.849 kg)             Mariann Laster K. Clearnce Leja M.D.

## 2015-05-20 NOTE — Patient Instructions (Signed)
Check BP  and pulse  Only 2 days of the week .  Contact us if pulse below 50  ( but would need to count for 60 seconds )  If readings not continue to come down in the next 3 weeks then we may increase the  Medication dosing depending on your heart rate and how you are doing.  Could increase to    Coreg 6.25  Twice a day .   Agree with   Trying weight watchers also .    Wt Readings from Last 3 Encounters:  05/20/15 221 lb 4.8 oz (100.381 kg)  05/07/15 216 lb 6.4 oz (98.158 kg)  12/11/14 206 lb 14.4 oz (93.849 kg)

## 2015-07-01 ENCOUNTER — Ambulatory Visit (INDEPENDENT_AMBULATORY_CARE_PROVIDER_SITE_OTHER): Payer: Medicare Other | Admitting: Internal Medicine

## 2015-07-01 ENCOUNTER — Encounter: Payer: Self-pay | Admitting: Internal Medicine

## 2015-07-01 VITALS — BP 156/86 | Temp 98.0°F | Ht 63.0 in | Wt 211.2 lb

## 2015-07-01 DIAGNOSIS — Z79899 Other long term (current) drug therapy: Secondary | ICD-10-CM

## 2015-07-01 DIAGNOSIS — I10 Essential (primary) hypertension: Secondary | ICD-10-CM | POA: Diagnosis not present

## 2015-07-01 NOTE — Patient Instructions (Signed)
increaet the carvedilol slowly  As discussed .   Send in readings  My chart in about a month  With pulse rate  Fu depending on  Readings   Then wellness check in may  Will do labs at visit .

## 2015-07-01 NOTE — Progress Notes (Signed)
Pre visit review using our clinic review tool, if applicable. No additional management support is needed unless otherwise documented below in the visit note.  Chief Complaint  Patient presents with  . Follow-up    HPI: Marie Kelly  70 y.o.  Here for fu HT  Only taking the 3.25 was hesitant to  Increase  Concern if could get se low pulse  To retire tomorrow no se of med so far.  Trying to lose weight  ROS: See pertinent positives and negatives per HPI.  Past Medical History  Diagnosis Date  . Colon polyps   . GERD (gastroesophageal reflux disease)     had endo? taking nexium 3 x per week  . Diverticulosis   . Hyperlipidemia     had been on lipitor changes to pravachol cause of cost and tricor has since stopped  NO  se reported   . Anemia   . Impaired fasting glucose   . Hypothyroidism      on meds for years  . Asthma   . Olecranon fracture 10/25/2013    right arm  . Ventricular bigeminy seen on cardiac monitor     Cardiac work up Dr Stanford Breed [facility]  . Fracture of right olecranon process 10/26/2013  . Wears glasses   . Wears dentures     top-partial bottom  . Complication of anesthesia     2 days after surgery-muscles hurtand spasmed-needed a muscle relaxant   . 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 Stroke Mother   . Diabetes Father   . Alzheimer's disease    . Stomach cancer Maternal Aunt   . Colon cancer Neg Hx   . Pancreatic cancer Neg Hx   . Heart disease Father     Social History   Social History  . Marital Status: Married    Spouse Name: N/A  . Number of Children: 3  . Years of Education: N/A   Social History Main Topics  . Smoking status: Former Smoker    Quit date: 10/25/1973  . Smokeless tobacco: Never Used  . Alcohol Use: 0.0 oz/week    0 Standard drinks or equivalent per week     Comment: socially  . Drug Use: No  . Sexual Activity: Not Asked    Other Topics Concern  . 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    Gravida 3 para 3   Last td 6 2008   HS educated       REmote hx of tobacco 1.5 ppd for 6 years as a teen Q 50    Outpatient Prescriptions Prior to Visit  Medication Sig Dispense Refill  . carvedilol (COREG) 3.125 MG tablet Take 1 tablet (3.125 mg total) by mouth 2 (two) times daily with a meal. 60 tablet 3  . Fluticasone-Salmeterol (ADVAIR) 250-50 MCG/DOSE AEPB Inhale 1 puff into the lungs every 12 (twelve) hours. Reported on 05/20/2015    . levothyroxine (SYNTHROID, LEVOTHROID) 50 MCG tablet Take 1 tablet (50 mcg total) by mouth daily. 90 tablet 3  . montelukast (SINGULAIR) 10 MG tablet Take 1 tablet (10 mg total) by mouth at bedtime. 90 tablet  3  . tolterodine (DETROL LA) 2 MG 24 hr capsule Take 1 capsule (2 mg total) by mouth daily. 90 capsule 3  . valsartan (DIOVAN) 160 MG tablet Take 1 tablet (160 mg total) by mouth daily. 30 tablet 2  . carvedilol (COREG) 6.25 MG tablet Take 1 tablet (6.25 mg total) by mouth 2 (two) times daily with a meal. (Patient not taking: Reported on 07/01/2015) 60 tablet 3  . tiZANidine (ZANAFLEX) 4 MG tablet Take 1 tablet (4 mg total) by mouth every 6 (six) hours as needed for muscle spasms. (Patient not taking: Reported on 07/01/2015) 30 tablet 0  . traMADol (ULTRAM) 50 MG tablet Take 1 tablet (50 mg total) by mouth every 6 (six) hours as needed. (Patient not taking: Reported on 05/07/2015) 30 tablet 0   No facility-administered medications prior to visit.     EXAM:  BP 156/86 mmHg  Temp(Src) 98 F (36.7 C) (Oral)  Ht 5\' 3"  (1.6 m)  Wt 211 lb 3.2 oz (95.8 kg)  BMI 37.42 kg/m2  Body mass index is 37.42 kg/(m^2). Repeat bp  142/78 large 150/90 ref  oulse 60 GENERAL: vitals reviewed and listed above, alert, oriented,  appears well hydrated and in no acute distress CV: HRRR, today no clubbing cyanosis or  peripheral edema nl cap refill  MS: moves all extremities without noticeable focal  abnormality PSYCH: pleasant and cooperative, no obvious depression or anxiety Wt Readings from Last 3 Encounters:  07/01/15 211 lb 3.2 oz (95.8 kg)  05/20/15 221 lb 4.8 oz (100.381 kg)  05/07/15 216 lb 6.4 oz (98.158 kg)   BP Readings from Last 3 Encounters:  07/01/15 156/86  05/20/15 148/78  05/07/15 160/78   Lab Results  Component Value Date   WBC 8.1 09/18/2014   HGB 13.5 09/18/2014   HCT 40.1 09/18/2014   PLT 202.0 09/18/2014   GLUCOSE 93 09/18/2014   CHOL 242* 09/18/2014   TRIG 158.0* 09/18/2014   HDL 45.50 09/18/2014   LDLDIRECT 168.5 04/23/2013   LDLCALC 165* 09/18/2014   ALT 30 09/18/2014   AST 21 09/18/2014   NA 138 09/18/2014   K 4.1 09/18/2014   CL 102 09/18/2014   CREATININE 0.91 09/18/2014   BUN 15 09/18/2014   CO2 27 09/18/2014   TSH 3.40 09/18/2014   HGBA1C 5.7 09/18/2014     ASSESSMENT AND PLAN:  Discussed the following assessment and plan:  Essential hypertension - should  trial inc to 6.25 bid  doubt sig for metabolic effects     Medication management  -Patient advised to return or notify health care team  if symptoms worsen ,persist or new concerns arise.  Patient Instructions  increaet the carvedilol slowly  As discussed .   Send in readings  My chart in about a month  With pulse rate  Fu depending on  Readings   Then wellness check in may  Will do labs at visit .   Standley Brooking. Panosh M.D.

## 2015-07-04 DIAGNOSIS — L820 Inflamed seborrheic keratosis: Secondary | ICD-10-CM | POA: Diagnosis not present

## 2015-07-04 DIAGNOSIS — L821 Other seborrheic keratosis: Secondary | ICD-10-CM | POA: Diagnosis not present

## 2015-07-04 DIAGNOSIS — H401212 Low-tension glaucoma, right eye, moderate stage: Secondary | ICD-10-CM | POA: Diagnosis not present

## 2015-07-04 DIAGNOSIS — L304 Erythema intertrigo: Secondary | ICD-10-CM | POA: Diagnosis not present

## 2015-07-04 DIAGNOSIS — L814 Other melanin hyperpigmentation: Secondary | ICD-10-CM | POA: Diagnosis not present

## 2015-07-04 DIAGNOSIS — H401222 Low-tension glaucoma, left eye, moderate stage: Secondary | ICD-10-CM | POA: Diagnosis not present

## 2015-07-11 DIAGNOSIS — H401212 Low-tension glaucoma, right eye, moderate stage: Secondary | ICD-10-CM | POA: Diagnosis not present

## 2015-07-11 DIAGNOSIS — H25813 Combined forms of age-related cataract, bilateral: Secondary | ICD-10-CM | POA: Diagnosis not present

## 2015-07-11 DIAGNOSIS — H401222 Low-tension glaucoma, left eye, moderate stage: Secondary | ICD-10-CM | POA: Diagnosis not present

## 2015-07-11 DIAGNOSIS — Z0100 Encounter for examination of eyes and vision without abnormal findings: Secondary | ICD-10-CM | POA: Diagnosis not present

## 2015-08-08 ENCOUNTER — Other Ambulatory Visit: Payer: Self-pay

## 2015-08-08 DIAGNOSIS — Z1231 Encounter for screening mammogram for malignant neoplasm of breast: Secondary | ICD-10-CM

## 2015-08-18 ENCOUNTER — Ambulatory Visit
Admission: RE | Admit: 2015-08-18 | Discharge: 2015-08-18 | Disposition: A | Discharge status: Home / Self Care | Payer: Medicare Other | Source: Ambulatory Visit

## 2015-08-18 DIAGNOSIS — Z1231 Encounter for screening mammogram for malignant neoplasm of breast: Secondary | ICD-10-CM | POA: Diagnosis not present

## 2015-09-09 ENCOUNTER — Other Ambulatory Visit: Payer: Self-pay | Admitting: Family Medicine

## 2015-09-10 NOTE — Telephone Encounter (Signed)
Sent to the pharmacy by e-scribe.  Pt has upcoming appt on 11/17/15

## 2015-10-03 DIAGNOSIS — H401234 Low-tension glaucoma, bilateral, indeterminate stage: Secondary | ICD-10-CM | POA: Diagnosis not present

## 2015-10-03 DIAGNOSIS — H25813 Combined forms of age-related cataract, bilateral: Secondary | ICD-10-CM | POA: Diagnosis not present

## 2015-10-03 DIAGNOSIS — H35371 Puckering of macula, right eye: Secondary | ICD-10-CM | POA: Diagnosis not present

## 2015-10-03 DIAGNOSIS — H43813 Vitreous degeneration, bilateral: Secondary | ICD-10-CM | POA: Diagnosis not present

## 2015-10-07 DIAGNOSIS — H35373 Puckering of macula, bilateral: Secondary | ICD-10-CM | POA: Diagnosis not present

## 2015-10-07 DIAGNOSIS — H43813 Vitreous degeneration, bilateral: Secondary | ICD-10-CM | POA: Diagnosis not present

## 2015-10-07 DIAGNOSIS — H35341 Macular cyst, hole, or pseudohole, right eye: Secondary | ICD-10-CM | POA: Diagnosis not present

## 2015-10-09 DIAGNOSIS — H5315 Visual distortions of shape and size: Secondary | ICD-10-CM | POA: Diagnosis not present

## 2015-10-09 DIAGNOSIS — H25813 Combined forms of age-related cataract, bilateral: Secondary | ICD-10-CM | POA: Diagnosis not present

## 2015-10-09 DIAGNOSIS — H35371 Puckering of macula, right eye: Secondary | ICD-10-CM | POA: Diagnosis not present

## 2015-10-09 DIAGNOSIS — H4912 Fourth [trochlear] nerve palsy, left eye: Secondary | ICD-10-CM | POA: Diagnosis not present

## 2015-10-31 ENCOUNTER — Other Ambulatory Visit: Payer: Self-pay | Admitting: Internal Medicine

## 2015-10-31 NOTE — Telephone Encounter (Signed)
Sent to the pharmacy by e-scribe. 

## 2015-11-17 ENCOUNTER — Encounter: Payer: Self-pay | Admitting: Internal Medicine

## 2015-11-17 ENCOUNTER — Ambulatory Visit (INDEPENDENT_AMBULATORY_CARE_PROVIDER_SITE_OTHER): Payer: Medicare Other | Admitting: Internal Medicine

## 2015-11-17 VITALS — BP 144/78 | Temp 98.3°F | Ht 63.5 in | Wt 206.9 lb

## 2015-11-17 DIAGNOSIS — E785 Hyperlipidemia, unspecified: Secondary | ICD-10-CM

## 2015-11-17 DIAGNOSIS — R202 Paresthesia of skin: Secondary | ICD-10-CM | POA: Diagnosis not present

## 2015-11-17 DIAGNOSIS — Z0000 Encounter for general adult medical examination without abnormal findings: Secondary | ICD-10-CM | POA: Diagnosis not present

## 2015-11-17 DIAGNOSIS — H269 Unspecified cataract: Secondary | ICD-10-CM

## 2015-11-17 DIAGNOSIS — R7301 Impaired fasting glucose: Secondary | ICD-10-CM | POA: Diagnosis not present

## 2015-11-17 DIAGNOSIS — I10 Essential (primary) hypertension: Secondary | ICD-10-CM | POA: Diagnosis not present

## 2015-11-17 DIAGNOSIS — Z79899 Other long term (current) drug therapy: Secondary | ICD-10-CM

## 2015-11-17 DIAGNOSIS — N3281 Overactive bladder: Secondary | ICD-10-CM

## 2015-11-17 LAB — LIPID PANEL
CHOL/HDL RATIO: 5
Cholesterol: 258 mg/dL — ABNORMAL HIGH (ref 0–200)
HDL: 51.4 mg/dL (ref >39.00)
LDL Cholesterol: 172 mg/dL — ABNORMAL HIGH (ref 0–99)
NONHDL: 206.58
Triglycerides: 171 mg/dL — ABNORMAL HIGH (ref 0.0–149.0)
VLDL: 34.2 mg/dL (ref 0.0–40.0)

## 2015-11-17 LAB — CBC WITH DIFFERENTIAL/PLATELET
BASOS PCT: 0.4 % (ref 0.0–3.0)
Basophils Absolute: 0 10*3/uL (ref 0.0–0.1)
EOS PCT: 5.9 % — AB (ref 0.0–5.0)
Eosinophils Absolute: 0.3 10*3/uL (ref 0.0–0.7)
HCT: 39.1 % (ref 36.0–46.0)
Hemoglobin: 13.2 g/dL (ref 12.0–15.0)
LYMPHS ABS: 1.3 10*3/uL (ref 0.7–4.0)
Lymphocytes Relative: 24.8 % (ref 12.0–46.0)
MCHC: 33.8 g/dL (ref 30.0–36.0)
MCV: 84.6 fl (ref 78.0–100.0)
MONO ABS: 0.6 10*3/uL (ref 0.1–1.0)
Monocytes Relative: 11 % (ref 3.0–12.0)
NEUTROS PCT: 57.9 % (ref 43.0–77.0)
Neutro Abs: 3.1 10*3/uL (ref 1.4–7.7)
Platelets: 234 10*3/uL (ref 150.0–400.0)
RBC: 4.62 Mil/uL (ref 3.87–5.11)
RDW: 14.8 % (ref 11.5–15.5)
WBC: 5.3 10*3/uL (ref 4.0–10.5)

## 2015-11-17 LAB — TSH: TSH: 2.13 u[IU]/mL (ref 0.35–4.50)

## 2015-11-17 LAB — BASIC METABOLIC PANEL
BUN: 14 mg/dL (ref 6–23)
CHLORIDE: 109 meq/L (ref 96–112)
CO2: 27 mEq/L (ref 19–32)
CREATININE: 0.76 mg/dL (ref 0.40–1.20)
Calcium: 9.6 mg/dL (ref 8.4–10.5)
GFR: 79.83 mL/min (ref >60.00)
GLUCOSE: 97 mg/dL (ref 70–99)
POTASSIUM: 4.5 meq/L (ref 3.5–5.1)
Sodium: 137 mEq/L (ref 135–145)

## 2015-11-17 LAB — HEPATIC FUNCTION PANEL
ALBUMIN: 4.5 g/dL (ref 3.5–5.2)
ALT: 20 U/L (ref 0–35)
AST: 14 U/L (ref 0–37)
Alkaline Phosphatase: 49 U/L (ref 39–117)
Bilirubin, Direct: 0.1 mg/dL (ref 0.0–0.3)
TOTAL PROTEIN: 7.1 g/dL (ref 6.0–8.3)
Total Bilirubin: 0.6 mg/dL (ref 0.2–1.2)

## 2015-11-17 LAB — HEMOGLOBIN A1C: HEMOGLOBIN A1C: 5.6 % (ref 4.6–6.5)

## 2015-11-17 MED ORDER — MONTELUKAST SODIUM 10 MG PO TABS: 10.0000 mg | ORAL_TABLET | Freq: Every day | ORAL | Status: DC

## 2015-11-17 MED ORDER — LEVOTHYROXINE SODIUM 50 MCG PO TABS: 50.0000 ug | ORAL_TABLET | Freq: Every day | ORAL | Status: DC

## 2015-11-17 MED ORDER — TOLTERODINE TARTRATE ER 2 MG PO CP24: 2.0000 mg | ORAL_CAPSULE | Freq: Every day | ORAL | Status: DC

## 2015-11-17 NOTE — Progress Notes (Signed)
Pre visit review using our clinic review tool, if applicable. No additional management support is needed unless otherwise documented below in the visit note.  Chief Complaint  Patient presents with  . Medicare Wellness    medications  allergies  . Hypertension    HPI: Marie Kelly 70 y.o. comes in today for Preventive Medicare wellness visit . Disease management   She is just recently retired come back from vacation.    She brings [place] a copy of some blood pressure readings. The majority are in the 1 4150 range but there is some on the low side such as 1:15. She did an orthostatic test based on a Td recommendation to check for adrenal problem and had a 129/60 7 in the afternoon. With some orthostatic changes. She didn't feel bad at that time. This morning it was 160/79 yesterday it was 115/75 she is taking valsartan 160 carvedilol 6.25 twice a day  She needs a refill of her Singulair as this when taken daily helps her allergies. She has no need for inhaler such as Advair because she is not having any asthma symptoms.  Sees a dermatologist yearly no recent biopsies needed.  Sees Dr. Kathrin Penner for cataracts and to have surgery on July 11.  She has occasional transient numbness in the tips of her fingers on the right and wonders if it's related to her previous elbow upper extremity fracture etc. No weakness noted and she continues her arm.  Needs refill of the tetralogy takes a daily thinks it helps urinary symptoms.   Health Maintenance  Topic Date Due  . INFLUENZA VACCINE  12/16/2015  . MAMMOGRAM  08/17/2017  . COLONOSCOPY  06/29/2019  . TETANUS/TDAP  09/08/2021  . DEXA SCAN  Completed  . ZOSTAVAX  Completed  . Hepatitis C Screening  Completed  . PNA vac Low Risk Adult  Completed   Health Maintenance Review LIFESTYLE:  TAD soc etoh 5-6 per week.  Sugar beverages: n Sleep:  7-8 hours     MEDICARE DOCUMENT QUESTIONS  TO SCAN   Hearing: ok p  Vision:  No limitations  at present . Last eye check UTD to have cataract surgery Safety:  Has smoke detector and wears seat belts.  No firearms. No excess sun exposure. Sees dentist regularly.  Falls: n  Advance directive :  Reviewed  Has one.  Memory: Felt to be good  , no concern from her or her family.  Depression: No anhedonia unusual crying or depressive symptoms  Nutrition: Eats well balanced diet; adequate calcium and vitamin D. No swallowing chewing problems.  Injury: no major injuries in the last six months.  Other healthcare providers:  Reviewed today .  Social:  Lives with spouse married.  No pets  just retired    Preventive parameters: up-to-date  Reviewed   ADLS:   There are no problems or need for assistance  driving, feeding, obtaining food, dressing, toileting and bathing, managing money using phone. She is independent.    ROS:  GEN/ HEENT: No fever, significant weight changes sweats headaches vision problems hearing changes, CV/ PULM; No chest pain shortness of breath cough, syncope,edema  change in exercise tolerance. GI /GU: No adominal pain, vomiting, change in bowel habits. No blood in the stool. No significant GU symptoms. SKIN/HEME: ,no acute skin rashes suspicious lesions or bleeding. No lymphadenopathy, nodules, masses.  NEURO/ PSYCH:  No neurologic signs such as weakness numbness. No depression anxiety. IMM/ Allergy: No unusual infections.  Allergy .  REST of 12 system review negative except as per HPI   Past Medical History  Diagnosis Date  . Colon polyps   . GERD (gastroesophageal reflux disease)     had endo? taking nexium 3 x per week  . Diverticulosis   . Hyperlipidemia     had been on lipitor changes to pravachol cause of cost and tricor has since stopped  NO  se reported   . Anemia   . Impaired fasting glucose   . Hypothyroidism      on meds for years  . Asthma   . Olecranon fracture 10/25/2013    right arm  . Ventricular bigeminy seen on cardiac monitor      Cardiac work up Dr Stanford Breed [facility]  . Fracture of right olecranon process 10/26/2013  . Wears glasses   . Wears dentures     top-partial bottom  . Complication of anesthesia     2 days after surgery-muscles hurtand spasmed-needed a muscle relaxant   . 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 Stroke Mother   . Diabetes Father   . Alzheimer's disease    . Stomach cancer Maternal Aunt   . Colon cancer Neg Hx   . Pancreatic cancer Neg Hx   . Heart disease Father     Social History   Social History  . Marital Status: Married    Spouse Name: N/A  . Number of Children: 3  . Years of Education: N/A   Social History Main Topics  . Smoking status: Former Smoker    Quit date: 10/25/1973  . Smokeless tobacco: Never Used  . Alcohol Use: 0.0 oz/week    0 Standard drinks or equivalent per week     Comment: socially  . Drug Use: No  . Sexual Activity: Not Asked   Other Topics Concern  . 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    Gravida 3 para 3   Last td 6 2008   HS educated       REmote hx of tobacco 1.5 ppd for 6 years as a teen Q 32    Outpatient Encounter Prescriptions as of 11/17/2015  Medication Sig  . carvedilol (COREG) 6.25 MG tablet TAKE 1 TABLET BY MOUTH TWICE DAILY WITH A MEAL.  Marland Kitchen levothyroxine (SYNTHROID, LEVOTHROID) 50 MCG tablet Take 1 tablet (50 mcg total) by mouth daily.  . montelukast (SINGULAIR) 10 MG tablet Take 1 tablet (10 mg total) by mouth at bedtime.  . tolterodine (DETROL LA) 2 MG 24 hr capsule Take 1 capsule (2 mg total) by mouth daily.  . valsartan (DIOVAN) 160 MG tablet TAKE 1 TABLET (160 MG TOTAL) BY MOUTH DAILY.  . [DISCONTINUED] levothyroxine (SYNTHROID, LEVOTHROID) 50 MCG tablet Take 1 tablet  (50 mcg total) by mouth daily.  . [DISCONTINUED] montelukast (SINGULAIR) 10 MG tablet Take 1 tablet (10 mg total) by mouth at bedtime.  . [DISCONTINUED] tolterodine (DETROL LA) 2 MG 24 hr capsule Take 1 capsule (2 mg total) by mouth daily.  . [DISCONTINUED] carvedilol (COREG) 3.125 MG tablet Take 1 tablet (3.125 mg total) by mouth 2 (two) times daily with a meal.  . [  DISCONTINUED] Fluticasone-Salmeterol (ADVAIR) 250-50 MCG/DOSE AEPB Inhale 1 puff into the lungs every 12 (twelve) hours. Reported on 05/20/2015  . [DISCONTINUED] tiZANidine (ZANAFLEX) 4 MG tablet Take 1 tablet (4 mg total) by mouth every 6 (six) hours as needed for muscle spasms. (Patient not taking: Reported on 07/01/2015)  . [DISCONTINUED] traMADol (ULTRAM) 50 MG tablet Take 1 tablet (50 mg total) by mouth every 6 (six) hours as needed. (Patient not taking: Reported on 05/07/2015)   No facility-administered encounter medications on file as of 11/17/2015.    EXAM:  BP 144/78 mmHg  Temp(Src) 98.3 F (36.8 C) (Oral)  Ht 5' 3.5" (1.613 m)  Wt 206 lb 14.4 oz (93.849 kg)  BMI 36.07 kg/m2  Body mass index is 36.07 kg/(m^2).  Physical Exam: Vital signs reviewed [id number] is a well-developed well-nourished alert cooperative   who appears stated age in no acute distress.  HEENT: normocephalic atraumatic , Eyes: PERRL EOM's full, conjunctiva clear, Nares: paten,t no deformity discharge or tenderness., Ears: no deformity EAC's clear TMs with normal landmarks. Mouth: clear OP, no lesions, edema.  Moist mucous membranes. Dentition in adequate repair. NECK: supple without masses, thyromegaly or bruits. CHEST/PULM:  Clear to auscultation and percussion breath sounds equal no wheeze , rales or rhonchi. No chest wall deformities or tenderness.Breast: normal by inspection . No dimpling, discharge, masses, tenderness or discharge .CV: PMI is nondisplaced, S1 S2 no gallops, murmurs, rubs. Peripheral pulses are full without delay.No JVD .  ABDOMEN:  Bowel sounds normal nontender  No guard or rebound, no hepato splenomegal no CVA tenderness.  Extremtities:  No clubbing cyanosis or edema, no acute joint swelling or redness no focal atrophy noted right hand  NEURO:  Oriented x3, cranial nerves 3-12 appear to be intact, no obvious focal weakness,gait within normal limits SKIN: No acute rashes normal turgor, color, no bruising or petechiae. PSYCH: Oriented, good eye contact, no obvious depression anxiety, cognition and judgment appear normal. LN: no cervical axillary inguinal adenopathy No noted deficits in memory, attention, and speech.    BP Readings from Last 3 Encounters:  11/17/15 144/78  07/01/15 156/86  05/20/15 148/78   Wt Readings from Last 3 Encounters:  11/17/15 206 lb 14.4 oz (93.849 kg)  07/01/15 211 lb 3.2 oz (95.8 kg)  05/20/15 221 lb 4.8 oz (100.381 kg)     ASSESSMENT AND PLAN:  Discussed the following assessment and plan:  Medicare annual wellness visit, subsequent - Plan: Basic metabolic panel  Essential hypertension - Plan: Basic metabolic panel, CBC with Differential/Platelet, Hemoglobin A1c, Lipid panel, Hepatic function panel, TSH  Medication management - Plan: Basic metabolic panel, CBC with Differential/Platelet, Hemoglobin A1c, Lipid panel, Hepatic function panel, TSH  Hyperlipidemia - Plan: Basic metabolic panel, CBC with Differential/Platelet, Hemoglobin A1c, Lipid panel, Hepatic function panel, TSH  Right hand paresthesia - History right extremity fracture no obvious weakness if having persistent symptoms let us know context etc. - Plan: Basic metabolic panel, CBC with Differential/Platelet, Hemoglobin A1c, Lipid panel, Hepatic function panel, TSH  Impaired fasting glucose - Plan: Basic metabolic panel, CBC with Differential/Platelet, Hemoglobin A1c, Lipid panel, Hepatic function panel, TSH  OAB (overactive bladder)  Cataract - to have surgery  Handout given from the pharmacy company about alpha  blockers and cataract surgery. Told patient is a very mild alpha blocker but she should contact her surgeon about this her surgeon should receive this note is to. If she needs [place] to change her blood pressure medicine temporarily for the surgery we can do  this and she can notify us.( doubt)     bp control  If not better    Send in message my chart or rov in 3 months   Patient Care Team: Burnis Medin, MD as PCP - General (Internal Medicine) Shon Hough, MD (Ophthalmology) Marchia Bond, MD as Consulting Physician (Orthopedic Surgery)  Patient Instructions  Continue to monitor BP readings   Ask your eye surgeon if we need to temporarily change  the carvedol  Intensify lifestyle interventions. If BP readings not at goal then we may need to adjust your bp meds   Or add low dose of another  med ,  ( 12.5 once in am and  6.25 at night    Coreg)   Will notify you  of labs when available. Plan fu in 3 months about bp or depending on labs   Health Maintenance, Female Adopting a healthy lifestyle and getting preventive care can go a long way to promote health and wellness. Talk with your health care provider about what schedule of regular examinations is right for you. This is a good chance for you to check in with your provider about disease prevention and staying healthy. In between checkups, there are plenty of things you can do on your own. Experts have done a lot of research about which lifestyle changes and preventive measures are most likely to keep you healthy. Ask your health care provider for more information. WEIGHT AND DIET  Eat a healthy diet  Be sure to include plenty of vegetables, fruits, low-fat dairy products, and lean protein.  Do not eat a lot of foods high in solid fats, added sugars, or salt.  Get regular exercise. This is one of the most important things you can do for your health.  Most adults should exercise for at least 150 minutes each week. The exercise should  increase your heart rate and make you sweat (moderate-intensity exercise).  Most adults should also do strengthening exercises at least twice a week. This is in addition to the moderate-intensity exercise.  Maintain a healthy weight  Body mass index (BMI) is a measurement that can be used to identify possible weight problems. It estimates body fat based on height and weight. Your health care provider can help determine your BMI and help you achieve or maintain a healthy weight.  For females 66 years of age and older:   A BMI below 18.5 is considered underweight.  A BMI of 18.5 to 24.9 is normal.  A BMI of 25 to 29.9 is considered overweight.  A BMI of 30 and above is considered obese.  Watch levels of cholesterol and blood lipids  You should start having your blood tested for lipids and cholesterol at 71 years of age, then have this test every 5 years.  You may need to have your cholesterol levels checked more often if:  Your lipid or cholesterol levels are high.  You are older than 71 years of age.  You are at high risk for heart disease.  CANCER SCREENING   Lung Cancer  Lung cancer screening is recommended for adults 66-87 years old who are at high risk for lung cancer because of a history of smoking.  A yearly low-dose CT scan of the lungs is recommended for people who:  Currently smoke.  Have quit within the past 15 years.  Have at least a 30-pack-year history of smoking. A pack year is smoking an average of one pack of cigarettes a day for  1 year.  Yearly screening should continue until it has been 15 years since you quit.  Yearly screening should stop if you develop a health problem that would prevent you from having lung cancer treatment.  Breast Cancer  Practice breast self-awareness. This means understanding how your breasts normally appear and feel.  It also means doing regular breast self-exams. Let your health care provider know about any changes, no  matter how small.  If you are in your 20s or 30s, you should have a clinical breast exam (CBE) by a health care provider every 1-3 years as part of a regular health exam.  If you are 6 or older, have a CBE every year. Also consider having a breast X-ray (mammogram) every year.  If you have a family history of breast cancer, talk to your health care provider about genetic screening.  If you are at high risk for breast cancer, talk to your health care provider about having an MRI and a mammogram every year.  Breast cancer gene (BRCA) assessment is recommended for women who have family members with BRCA-related cancers. BRCA-related cancers include:  Breast.  Ovarian.  Tubal.  Peritoneal cancers.  Results of the assessment will determine the need for genetic counseling and BRCA1 and BRCA2 testing. Cervical Cancer Your health care provider may recommend that you be screened regularly for cancer of the pelvic organs (ovaries, uterus, and vagina). This screening involves a pelvic examination, including checking for microscopic changes to the surface of your cervix (Pap test). You may be encouraged to have this screening done every 3 years, beginning at age 52.  For women ages 67-65, health care providers may recommend pelvic exams and Pap testing every 3 years, or they may recommend the Pap and pelvic exam, combined with testing for human papilloma virus (HPV), every 5 years. Some types of HPV increase your risk of cervical cancer. Testing for HPV may also be done on women of any age with unclear Pap test results.  Other health care providers may not recommend any screening for nonpregnant women who are considered low risk for pelvic cancer and who do not have symptoms. Ask your health care provider if a screening pelvic exam is right for you.  If you have had past treatment for cervical cancer or a condition that could lead to cancer, you need Pap tests and screening for cancer for at least  20 years after your treatment. If Pap tests have been discontinued, your risk factors (such as having a new sexual partner) need to be reassessed to determine if screening should resume. Some women have medical problems that increase the chance of getting cervical cancer. In these cases, your health care provider may recommend more frequent screening and Pap tests. Colorectal Cancer  This type of cancer can be detected and often prevented.  Routine colorectal cancer screening usually begins at 71 years of age and continues through 71 years of age.  Your health care provider may recommend screening at an earlier age if you have risk factors for colon cancer.  Your health care provider may also recommend using home test kits to check for hidden blood in the stool.  A small camera at the end of a tube can be used to examine your colon directly (sigmoidoscopy or colonoscopy). This is done to check for the earliest forms of colorectal cancer.  Routine screening usually begins at age 3.  Direct examination of the colon should be repeated every 5-10 years through 71 years of  age. However, you may need to be screened more often if early forms of precancerous polyps or small growths are found. Skin Cancer  Check your skin from head to toe regularly.  Tell your health care provider about any new moles or changes in moles, especially if there is a change in a mole's shape or color.  Also tell your health care provider if you have a mole that is larger than the size of a pencil eraser.  Always use sunscreen. Apply sunscreen liberally and repeatedly throughout the day.  Protect yourself by wearing long sleeves, pants, a wide-brimmed hat, and sunglasses whenever you are outside. HEART DISEASE, DIABETES, AND HIGH BLOOD PRESSURE   High blood pressure causes heart disease and increases the risk of stroke. High blood pressure is more likely to develop in:  People who have blood pressure in the high end  of the normal range (130-139/85-89 mm Hg).  People who are overweight or obese.  People who are African American.  If you are 22-48 years of age, have your blood pressure checked every 3-5 years. If you are 45 years of age or older, have your blood pressure checked every year. You should have your blood pressure measured twice--once when you are at a hospital or clinic, and once when you are not at a hospital or clinic. Record the average of the two measurements. To check your blood pressure when you are not at a hospital or clinic, you can use:  An automated blood pressure machine at a pharmacy.  A home blood pressure monitor.  If you are between 2 years and 57 years old, ask your health care provider if you should take aspirin to prevent strokes.  Have regular diabetes screenings. This involves taking a blood sample to check your fasting blood sugar level.  If you are at a normal weight and have a low risk for diabetes, have this test once every three years after 71 years of age.  If you are overweight and have a high risk for diabetes, consider being tested at a younger age or more often. PREVENTING INFECTION  Hepatitis B  If you have a higher risk for hepatitis B, you should be screened for this virus. You are considered at high risk for hepatitis B if:  You were born in a country where hepatitis B is common. Ask your health care provider which countries are considered high risk.  Your parents were born in a high-risk country, and you have not been immunized against hepatitis B (hepatitis B vaccine).  You have HIV or AIDS.  You use needles to inject street drugs.  You live with someone who has hepatitis B.  You have had sex with someone who has hepatitis B.  You get hemodialysis treatment.  You take certain medicines for conditions, including cancer, organ transplantation, and autoimmune conditions. Hepatitis C  Blood testing is recommended for:  Everyone born from  [birth year] through 1966.  Anyone with known risk factors for hepatitis C. Sexually transmitted infections (STIs)  You should be screened for sexually transmitted infections (STIs) including gonorrhea and chlamydia if:  You are sexually active and are younger than 71 years of age.  You are older than 71 years of age and your health care provider tells you that you are at risk for this type of infection.  Your sexual activity has changed since you were last screened and you are at an increased risk for chlamydia or gonorrhea. Ask your health care provider if you  are at risk.  If you do not have HIV, but are at risk, it may be recommended that you take a prescription medicine daily to prevent HIV infection. This is called pre-exposure prophylaxis (PrEP). You are considered at risk if:  You are sexually active and do not regularly use condoms or know the HIV status of your partner(s).  You take drugs by injection.  You are sexually active with a partner who has HIV. Talk with your health care provider about whether you are at high risk of being infected with HIV. If you choose to begin PrEP, you should first be tested for HIV. You should then be tested every 3 months for as long as you are taking PrEP.  PREGNANCY   If you are premenopausal and you may become pregnant, ask your health care provider about preconception counseling.  If you may become pregnant, take 400 to 800 micrograms (mcg) of folic acid every day.  If you want to prevent pregnancy, talk to your health care provider about birth control (contraception). OSTEOPOROSIS AND MENOPAUSE   Osteoporosis is a disease in which the bones lose minerals and strength with aging. This can result in serious bone fractures. Your risk for osteoporosis can be identified using a bone density scan.  If you are 37 years of age or older, or if you are at risk for osteoporosis and fractures, ask your health care provider if you should be screened.  Ask  your health care provider whether you should take a calcium or vitamin D supplement to lower your risk for osteoporosis.  Menopause may have certain physical symptoms and risks.  Hormone replacement therapy may reduce some of these symptoms and risks. Talk to your health care provider about whether hormone replacement therapy is right for you.  HOME CARE INSTRUCTIONS   Schedule regular health, dental, and eye exams.  Stay current with your immunizations.   Do not use any tobacco products including cigarettes, chewing tobacco, or electronic cigarettes.  If you are pregnant, do not drink alcohol.  If you are breastfeeding, limit how much and how often you drink alcohol.  Limit alcohol intake to no more than 1 drink per day for nonpregnant women. One drink equals 12 ounces of beer, 5 ounces of wine, or 1 ounces of hard liquor.  Do not use street drugs.  Do not share needles.  Ask your health care provider for help if you need support or information about quitting drugs.  Tell your health care provider if you often feel depressed.  Tell your health care provider if you have ever been abused or do not feel safe at home.   This information is not intended to replace advice given to you by your health care provider. Make sure you discuss any questions you have with your health care provider.   Document Released: 11/16/2010 Document Revised: 05/24/2014 Document Reviewed: 04/04/2013 Elsevier Interactive Patient Education 2016 [place] K. Panosh M.D.

## 2015-11-17 NOTE — Patient Instructions (Addendum)
Continue to monitor BP readings   Ask your eye surgeon if we need to temporarily change  the carvedol  Intensify lifestyle interventions. If BP readings not at goal then we may need to adjust your bp meds   Or add low dose of another  med ,  ( 12.5 once in am and  6.25 at night    Coreg)   Will notify you  of labs when available. Plan fu in 3 months about bp or depending on labs   Health Maintenance, Female Adopting a healthy lifestyle and getting preventive care can go a long way to promote health and wellness. Talk with your health care provider about what schedule of regular examinations is right for you. This is a good chance for you to check in with your provider about disease prevention and staying healthy. In between checkups, there are plenty of things you can do on your own. Experts have done a lot of research about which lifestyle changes and preventive measures are most likely to keep you healthy. Ask your health care provider for more information. WEIGHT AND DIET  Eat a healthy diet  Be sure to include plenty of vegetables, fruits, low-fat dairy products, and lean protein.  Do not eat a lot of foods high in solid fats, added sugars, or salt.  Get regular exercise. This is one of the most important things you can do for your health.  Most adults should exercise for at least 150 minutes each week. The exercise should increase your heart rate and make you sweat (moderate-intensity exercise).  Most adults should also do strengthening exercises at least twice a week. This is in addition to the moderate-intensity exercise.  Maintain a healthy weight  Body mass index (BMI) is a measurement that can be used to identify possible weight problems. It estimates body fat based on height and weight. Your health care provider can help determine your BMI and help you achieve or maintain a healthy weight.  For females 18 years of age and older:   A BMI below 18.5 is considered  underweight.  A BMI of 18.5 to 24.9 is normal.  A BMI of 25 to 29.9 is considered overweight.  A BMI of 30 and above is considered obese.  Watch levels of cholesterol and blood lipids  You should start having your blood tested for lipids and cholesterol at 71 years of age, then have this test every 5 years.  You may need to have your cholesterol levels checked more often if:  Your lipid or cholesterol levels are high.  You are older than 71 years of age.  You are at high risk for heart disease.  CANCER SCREENING   Lung Cancer  Lung cancer screening is recommended for adults 66-9 years old who are at high risk for lung cancer because of a history of smoking.  A yearly low-dose CT scan of the lungs is recommended for people who:  Currently smoke.  Have quit within the past 15 years.  Have at least a 30-pack-year history of smoking. A pack year is smoking an average of one pack of cigarettes a day for 1 year.  Yearly screening should continue until it has been 15 years since you quit.  Yearly screening should stop if you develop a health problem that would prevent you from having lung cancer treatment.  Breast Cancer  Practice breast self-awareness. This means understanding how your breasts normally appear and feel.  It also means doing regular breast self-exams.  Let your health care provider know about any changes, no matter how small.  If you are in your 20s or 30s, you should have a clinical breast exam (CBE) by a health care provider every 1-3 years as part of a regular health exam.  If you are 55 or older, have a CBE every year. Also consider having a breast X-ray (mammogram) every year.  If you have a family history of breast cancer, talk to your health care provider about genetic screening.  If you are at high risk for breast cancer, talk to your health care provider about having an MRI and a mammogram every year.  Breast cancer gene (BRCA) assessment is  recommended for women who have family members with BRCA-related cancers. BRCA-related cancers include:  Breast.  Ovarian.  Tubal.  Peritoneal cancers.  Results of the assessment will determine the need for genetic counseling and BRCA1 and BRCA2 testing. Cervical Cancer Your health care provider may recommend that you be screened regularly for cancer of the pelvic organs (ovaries, uterus, and vagina). This screening involves a pelvic examination, including checking for microscopic changes to the surface of your cervix (Pap test). You may be encouraged to have this screening done every 3 years, beginning at age 33.  For women ages 62-65, health care providers may recommend pelvic exams and Pap testing every 3 years, or they may recommend the Pap and pelvic exam, combined with testing for human papilloma virus (HPV), every 5 years. Some types of HPV increase your risk of cervical cancer. Testing for HPV may also be done on women of any age with unclear Pap test results.  Other health care providers may not recommend any screening for nonpregnant women who are considered low risk for pelvic cancer and who do not have symptoms. Ask your health care provider if a screening pelvic exam is right for you.  If you have had past treatment for cervical cancer or a condition that could lead to cancer, you need Pap tests and screening for cancer for at least 20 years after your treatment. If Pap tests have been discontinued, your risk factors (such as having a new sexual partner) need to be reassessed to determine if screening should resume. Some women have medical problems that increase the chance of getting cervical cancer. In these cases, your health care provider may recommend more frequent screening and Pap tests. Colorectal Cancer  This type of cancer can be detected and often prevented.  Routine colorectal cancer screening usually begins at 71 years of age and continues through 71 years of  age.  Your health care provider may recommend screening at an earlier age if you have risk factors for colon cancer.  Your health care provider may also recommend using home test kits to check for hidden blood in the stool.  A small camera at the end of a tube can be used to examine your colon directly (sigmoidoscopy or colonoscopy). This is done to check for the earliest forms of colorectal cancer.  Routine screening usually begins at age 69.  Direct examination of the colon should be repeated every 5-10 years through 71 years of age. However, you may need to be screened more often if early forms of precancerous polyps or small growths are found. Skin Cancer  Check your skin from head to toe regularly.  Tell your health care provider about any new moles or changes in moles, especially if there is a change in a mole's shape or color.  Also tell  your health care provider if you have a mole that is larger than the size of a pencil eraser.  Always use sunscreen. Apply sunscreen liberally and repeatedly throughout the day.  Protect yourself by wearing long sleeves, pants, a wide-brimmed hat, and sunglasses whenever you are outside. HEART DISEASE, DIABETES, AND HIGH BLOOD PRESSURE   High blood pressure causes heart disease and increases the risk of stroke. High blood pressure is more likely to develop in:  People who have blood pressure in the high end of the normal range (130-139/85-89 mm Hg).  People who are overweight or obese.  People who are African American.  If you are 65-25 years of age, have your blood pressure checked every 3-5 years. If you are 13 years of age or older, have your blood pressure checked every year. You should have your blood pressure measured twice--once when you are at a hospital or clinic, and once when you are not at a hospital or clinic. Record the average of the two measurements. To check your blood pressure when you are not at a hospital or clinic, you can  use:  An automated blood pressure machine at a pharmacy.  A home blood pressure monitor.  If you are between 2 years and 47 years old, ask your health care provider if you should take aspirin to prevent strokes.  Have regular diabetes screenings. This involves taking a blood sample to check your fasting blood sugar level.  If you are at a normal weight and have a low risk for diabetes, have this test once every three years after 71 years of age.  If you are overweight and have a high risk for diabetes, consider being tested at a younger age or more often. PREVENTING INFECTION  Hepatitis B  If you have a higher risk for hepatitis B, you should be screened for this virus. You are considered at high risk for hepatitis B if:  You were born in a country where hepatitis B is common. Ask your health care provider which countries are considered high risk.  Your parents were born in a high-risk country, and you have not been immunized against hepatitis B (hepatitis B vaccine).  You have HIV or AIDS.  You use needles to inject street drugs.  You live with someone who has hepatitis B.  You have had sex with someone who has hepatitis B.  You get hemodialysis treatment.  You take certain medicines for conditions, including cancer, organ transplantation, and autoimmune conditions. Hepatitis C  Blood testing is recommended for:  Everyone born from [birth year] through 1966.  Anyone with known risk factors for hepatitis C. Sexually transmitted infections (STIs)  You should be screened for sexually transmitted infections (STIs) including gonorrhea and chlamydia if:  You are sexually active and are younger than 71 years of age.  You are older than 71 years of age and your health care provider tells you that you are at risk for this type of infection.  Your sexual activity has changed since you were last screened and you are at an increased risk for chlamydia or gonorrhea. Ask your health care  provider if you are at risk.  If you do not have HIV, but are at risk, it may be recommended that you take a prescription medicine daily to prevent HIV infection. This is called pre-exposure prophylaxis (PrEP). You are considered at risk if:  You are sexually active and do not regularly use condoms or know the HIV status of your partner(s).  You take drugs by injection.  You are sexually active with a partner who has HIV. Talk with your health care provider about whether you are at high risk of being infected with HIV. If you choose to begin PrEP, you should first be tested for HIV. You should then be tested every 3 months for as long as you are taking PrEP.  PREGNANCY   If you are premenopausal and you may become pregnant, ask your health care provider about preconception counseling.  If you may become pregnant, take 400 to 800 micrograms (mcg) of folic acid every day.  If you want to prevent pregnancy, talk to your health care provider about birth control (contraception). OSTEOPOROSIS AND MENOPAUSE   Osteoporosis is a disease in which the bones lose minerals and strength with aging. This can result in serious bone fractures. Your risk for osteoporosis can be identified using a bone density scan.  If you are 65 years of age or older, or if you are at risk for osteoporosis and fractures, ask your health care provider if you should be screened.  Ask your health care provider whether you should take a calcium or vitamin D supplement to lower your risk for osteoporosis.  Menopause may have certain physical symptoms and risks.  Hormone replacement therapy may reduce some of these symptoms and risks. Talk to your health care provider about whether hormone replacement therapy is right for you.  HOME CARE INSTRUCTIONS   Schedule regular health, dental, and eye exams.  Stay current with your immunizations.   Do not use any tobacco products including cigarettes, chewing tobacco, or  electronic cigarettes.  If you are pregnant, do not drink alcohol.  If you are breastfeeding, limit how much and how often you drink alcohol.  Limit alcohol intake to no more than 1 drink per day for nonpregnant women. One drink equals 12 ounces of beer, 5 ounces of wine, or 1 ounces of hard liquor.  Do not use street drugs.  Do not share needles.  Ask your health care provider for help if you need support or information about quitting drugs.  Tell your health care provider if you often feel depressed.  Tell your health care provider if you have ever been abused or do not feel safe at home.   This information is not intended to replace advice given to you by your health care provider. Make sure you discuss any questions you have with your health care provider.   Document Released: 11/16/2010 Document Revised: 05/24/2014 Document Reviewed: 04/04/2013 Elsevier Interactive Patient Education 2016 [employer] Inc[employer]  

## 2015-11-25 DIAGNOSIS — H268 Other specified cataract: Secondary | ICD-10-CM | POA: Diagnosis not present

## 2015-11-25 DIAGNOSIS — H5211 Myopia, right eye: Secondary | ICD-10-CM | POA: Diagnosis not present

## 2015-11-25 DIAGNOSIS — H25811 Combined forms of age-related cataract, right eye: Secondary | ICD-10-CM | POA: Diagnosis not present

## 2015-12-02 DIAGNOSIS — H268 Other specified cataract: Secondary | ICD-10-CM | POA: Diagnosis not present

## 2015-12-02 DIAGNOSIS — H25812 Combined forms of age-related cataract, left eye: Secondary | ICD-10-CM | POA: Diagnosis not present

## 2015-12-03 ENCOUNTER — Other Ambulatory Visit: Payer: Self-pay | Admitting: Internal Medicine

## 2015-12-03 NOTE — Telephone Encounter (Signed)
Sent to the pharmacy by e-scribe. 

## 2016-01-05 DIAGNOSIS — M25511 Pain in right shoulder: Secondary | ICD-10-CM | POA: Diagnosis not present

## 2016-01-10 DIAGNOSIS — M25511 Pain in right shoulder: Secondary | ICD-10-CM | POA: Diagnosis not present

## 2016-01-12 DIAGNOSIS — M25511 Pain in right shoulder: Secondary | ICD-10-CM | POA: Diagnosis not present

## 2016-01-14 DIAGNOSIS — H35371 Puckering of macula, right eye: Secondary | ICD-10-CM | POA: Diagnosis not present

## 2016-02-05 DIAGNOSIS — K640 First degree hemorrhoids: Secondary | ICD-10-CM | POA: Diagnosis not present

## 2016-02-05 DIAGNOSIS — K5730 Diverticulosis of large intestine without perforation or abscess without bleeding: Secondary | ICD-10-CM | POA: Diagnosis not present

## 2016-02-05 DIAGNOSIS — D125 Benign neoplasm of sigmoid colon: Secondary | ICD-10-CM | POA: Diagnosis not present

## 2016-02-05 DIAGNOSIS — Z86010 Personal history of colonic polyps: Secondary | ICD-10-CM | POA: Diagnosis not present

## 2016-02-05 DIAGNOSIS — K635 Polyp of colon: Secondary | ICD-10-CM | POA: Diagnosis not present

## 2016-02-05 LAB — HM COLONOSCOPY

## 2016-02-10 DIAGNOSIS — K635 Polyp of colon: Secondary | ICD-10-CM | POA: Diagnosis not present

## 2016-02-24 ENCOUNTER — Encounter: Payer: Self-pay | Admitting: Family Medicine

## 2016-02-25 ENCOUNTER — Other Ambulatory Visit (INDEPENDENT_AMBULATORY_CARE_PROVIDER_SITE_OTHER): Payer: Medicare Other

## 2016-02-25 DIAGNOSIS — E785 Hyperlipidemia, unspecified: Secondary | ICD-10-CM | POA: Diagnosis not present

## 2016-02-25 LAB — LIPID PANEL
CHOL/HDL RATIO: 5
Cholesterol: 210 mg/dL — ABNORMAL HIGH (ref 0–200)
HDL: 43.7 mg/dL (ref >39.00)
LDL Cholesterol: 130 mg/dL — ABNORMAL HIGH (ref 0–99)
NONHDL: 166.15
Triglycerides: 179 mg/dL — ABNORMAL HIGH (ref 0.0–149.0)
VLDL: 35.8 mg/dL (ref 0.0–40.0)

## 2016-02-27 NOTE — Progress Notes (Signed)
Pre visit review using our clinic review tool, if applicable. No additional management support is needed unless otherwise documented below in the visit note.  Chief Complaint  Patient presents with  . Follow-up    HPI: Marie Kelly 69 y.o. here for fu  Of ht  And lipids Has lost weight although exercise problematic cause of knee and elbow  Asks about krill oil and taking flexogenics some help with knee.  No cp sob  Has bp log  ROS: See pertinent positives and negatives per HPI.  Past Medical History:  Diagnosis Date  . Anemia   . Asthma   . 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 Colon polyps   . Complication of anesthesia    2 days after surgery-muscles hurtand spasmed-needed a muscle relaxant   . Diverticulosis   . Fracture of right olecranon process 10/26/2013  . GERD (gastroesophageal reflux disease)    had endo? taking nexium 3 x per week  . Hyperlipidemia    had been on lipitor changes to pravachol cause of cost and tricor has since stopped  NO  se reported   . Hypothyroidism     on meds for years  . Impaired fasting glucose   . Olecranon fracture 10/25/2013   right arm  . Ventricular bigeminy seen on cardiac monitor    Cardiac work up Dr Stanford Breed [facility]  . Wears dentures    top-partial bottom  . Wears glasses     Family History  Problem Relation Age of Onset  . Stroke Mother   . Diabetes Father   . Alzheimer's disease    . Stomach cancer Maternal Aunt   . Colon cancer Neg Hx   . Pancreatic cancer Neg Hx   . Heart disease Father     Social History   Social History  . Marital status: Married    Spouse name: N/A  . Number of children: 3  . Years of education: N/A   Social History Main Topics  . Smoking status: Former Smoker    Quit date: 10/25/1973  . Smokeless tobacco: Never Used  . Alcohol use 0.0 oz/week     Comment: socially  . Drug use: No  . Sexual activity: Not Asked   Other Topics Concern    . 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    Gravida 3 para 3   Last td 6 2008   HS educated       REmote hx of tobacco 1.5 ppd for 6 years as a teen Q 57    Outpatient Medications Prior to Visit  Medication Sig Dispense Refill  . carvedilol (COREG) 6.25 MG tablet TAKE 1 TABLET BY MOUTH TWICE DAILY WITH A MEAL. 60 tablet 3  . levothyroxine (SYNTHROID, LEVOTHROID) 50 MCG tablet Take 1 tablet (50 mcg total) by mouth daily. 90 tablet 3  . montelukast (SINGULAIR) 10 MG tablet Take 1 tablet (10 mg total) by mouth at bedtime. 90 tablet 3  . tolterodine (DETROL LA) 2 MG 24 hr capsule Take 1 capsule (2 mg total) by mouth daily. 90 capsule 3  . valsartan (DIOVAN) 160 MG tablet TAKE ONE TABLET BY MOUTH DAILY 30 tablet 3   No  facility-administered medications prior to visit.      EXAM:  BP (!) 154/80 (BP Location: Right Arm, Patient Position: Sitting, Cuff Size: Normal)   Temp 98.2 F (36.8 C) (Oral)   Ht 5' 3.5" (1.613 m)   Wt 191 lb 3.2 oz (86.7 kg)   BMI 33.34 kg/m   Body mass index is 33.34 kg/m.  GENERAL: vitals reviewed and listed above, alert, oriented, appears well hydrated and in no acute distress HEENT: atraumatic, conjunctiva  clear, no obvious abnormalities on inspection of external nose and ears NECK: no obvious masses on inspection palpation  LUNGS: clear to auscultation bilaterally, no wheezes, rales or rhonchi,  CV: HRRR, no clubbing cyanosis or  peripheral edema nl cap refill  MS: moves all extremities  PSYCH: pleasant and cooperative, no obvious depression or anxiety Bp log reveiwed mostly 140 - [id number] range  Diastolic in  60 range    Lab Results  Component Value Date   WBC 5.3 11/17/2015   HGB 13.2 11/17/2015   HCT 39.1 11/17/2015   PLT 234.0 11/17/2015   GLUCOSE 97 11/17/2015   CHOL 210 (H)  02/25/2016   TRIG 179.0 (H) 02/25/2016   HDL 43.70 02/25/2016   LDLDIRECT 168.5 04/23/2013   LDLCALC 130 (H) 02/25/2016   ALT 20 11/17/2015   AST 14 11/17/2015   NA 137 11/17/2015   K 4.5 11/17/2015   CL 109 11/17/2015   CREATININE 0.76 11/17/2015   BUN 14 11/17/2015   CO2 27 11/17/2015   TSH 2.13 11/17/2015   HGBA1C 5.6 11/17/2015   Wt Readings from Last 3 Encounters:  03/01/16 191 lb 3.2 oz (86.7 kg)  11/17/15 206 lb 14.4 oz (93.8 kg)  07/01/15 211 lb 3.2 oz (95.8 kg)    ASSESSMENT AND PLAN:  Discussed the following assessment and plan:  Essential hypertension - not wiute at goal prefer 140 and below goal  in valsartan tio 320 send in readings  rov in 3 months  Hyperlipidemia, unspecified hyperlipidemia type - improved  with lis but still could be better prefers no med  Arthritis of right knee Lipids disc   Fish  In diet she has cut out simple carbs  Such as breads etc .  Good weight loss can help  To geg flu vaccinea t HT. -Patient advised to return or notify health care team  if symptoms worsen ,persist or new concerns arise.  Patient Instructions  Keep up the life style intervention.   bp still a bit high for your   increase valsartan .   320  Mg     Keep monitoring your bp.   And send in reading after a month .  To see if better controlled .  Then plan follow up .       Standley Brooking. Panosh M.D.

## 2016-03-01 ENCOUNTER — Encounter: Payer: Self-pay | Admitting: Internal Medicine

## 2016-03-01 ENCOUNTER — Ambulatory Visit (INDEPENDENT_AMBULATORY_CARE_PROVIDER_SITE_OTHER): Payer: Medicare Other | Admitting: Internal Medicine

## 2016-03-01 VITALS — BP 154/80 | Temp 98.2°F | Ht 63.5 in | Wt 191.2 lb

## 2016-03-01 DIAGNOSIS — M1711 Unilateral primary osteoarthritis, right knee: Secondary | ICD-10-CM

## 2016-03-01 DIAGNOSIS — E785 Hyperlipidemia, unspecified: Secondary | ICD-10-CM

## 2016-03-01 DIAGNOSIS — I10 Essential (primary) hypertension: Secondary | ICD-10-CM | POA: Diagnosis not present

## 2016-03-01 MED ORDER — VALSARTAN 320 MG PO TABS: 320.0000 mg | ORAL_TABLET | Freq: Every day | ORAL | 2 refills | Status: DC

## 2016-03-01 NOTE — Patient Instructions (Addendum)
Keep up the life style intervention.   bp still a bit high for your   increase valsartan .   320  Mg     Keep monitoring your bp.   And send in reading after a month .  To see if better controlled .  Then plan follow up .

## 2016-03-05 DIAGNOSIS — Z23 Encounter for immunization: Secondary | ICD-10-CM | POA: Diagnosis not present

## 2016-03-18 DIAGNOSIS — H35372 Puckering of macula, left eye: Secondary | ICD-10-CM | POA: Diagnosis not present

## 2016-03-18 DIAGNOSIS — H35371 Puckering of macula, right eye: Secondary | ICD-10-CM | POA: Diagnosis not present

## 2016-03-22 ENCOUNTER — Other Ambulatory Visit: Payer: Self-pay | Admitting: Internal Medicine

## 2016-03-23 NOTE — Telephone Encounter (Signed)
Sent to the pharmacy by e-scribe for 3 months.  Pt has upcoming follow up on 06/01/16 for bp.

## 2016-04-13 ENCOUNTER — Encounter: Payer: Self-pay | Admitting: Internal Medicine

## 2016-04-13 ENCOUNTER — Ambulatory Visit (INDEPENDENT_AMBULATORY_CARE_PROVIDER_SITE_OTHER): Payer: Medicare Other | Admitting: Internal Medicine

## 2016-04-13 VITALS — BP 136/72 | Temp 98.2°F | Ht 63.5 in | Wt 187.0 lb

## 2016-04-13 DIAGNOSIS — N764 Abscess of vulva: Secondary | ICD-10-CM | POA: Diagnosis not present

## 2016-04-13 DIAGNOSIS — I10 Essential (primary) hypertension: Secondary | ICD-10-CM

## 2016-04-13 MED ORDER — DOXYCYCLINE HYCLATE 100 MG PO TABS: 100.0000 mg | ORAL_TABLET | Freq: Two times a day (BID) | ORAL | 0 refills | Status: DC

## 2016-04-13 NOTE — Progress Notes (Signed)
Pre visit review using our clinic review tool, if applicable. No additional management support is needed unless otherwise documented below in the visit note.  Chief Complaint  Patient presents with  . Recurrent Skin Infections    Left labia.    HPI: Marie Kelly 70 y.o.   sda    Onset 2-3 days bump that is now tender and larger   No draingage or fever   But hurts    Never had this before   No dc dysuria  hruts to sit  ROS: See pertinent positives and negatives per HPI. bp is better and losing weight  Past Medical History:  Diagnosis Date  . Anemia   . Asthma   . 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 Colon polyps   . Complication of anesthesia    2 days after surgery-muscles hurtand spasmed-needed a muscle relaxant   . Diverticulosis   . Fracture of right olecranon process 10/26/2013  . GERD (gastroesophageal reflux disease)    had endo? taking nexium 3 x per week  . Hyperlipidemia    had been on lipitor changes to pravachol cause of cost and tricor has since stopped  NO  se reported   . Hypothyroidism     on meds for years  . Impaired fasting glucose   . Olecranon fracture 10/25/2013   right arm  . Ventricular bigeminy seen on cardiac monitor    Cardiac work up Dr Stanford Breed [facility]  . Wears dentures    top-partial bottom  . Wears glasses     Family History  Problem Relation Age of Onset  . Stroke Mother   . Diabetes Father   . Alzheimer's disease    . Stomach cancer Maternal Aunt   . Colon cancer Neg Hx   . Pancreatic cancer Neg Hx   . Heart disease Father     Social History   Social History  . Marital status: Married    Spouse name: N/A  . Number of children: 3  . Years of education: N/A   Social History Main Topics  . Smoking status: Former Smoker    Quit date: 10/25/1973  . Smokeless tobacco: Never Used  . Alcohol use 0.0 oz/week     Comment: socially  . Drug use: No  . Sexual activity: Not Asked    Other Topics Concern  . 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    Gravida 3 para 3   Last td 6 2008   HS educated       REmote hx of tobacco 1.5 ppd for 6 years as a teen Q 52    Outpatient Medications Prior to Visit  Medication Sig Dispense Refill  . carvedilol (COREG) 6.25 MG tablet TAKE 1 TABLET BY MOUTH TWICE DAILY WITH A MEAL. 60 tablet 2  . levothyroxine (SYNTHROID, LEVOTHROID) 50 MCG tablet Take 1 tablet (50 mcg total) by mouth daily. 90 tablet 3  . montelukast (SINGULAIR) 10 MG tablet Take 1 tablet (10 mg total) by mouth at bedtime. 90 tablet 3  . tolterodine (DETROL LA) 2 MG 24 hr capsule Take 1 capsule (2 mg total) by mouth daily. 90 capsule 3  . valsartan (DIOVAN) 320  MG tablet Take 1 tablet (320 mg total) by mouth daily. 90 tablet 2   No facility-administered medications prior to visit.      EXAM:  BP 136/72 (BP Location: Right Arm, Patient Position: Sitting, Cuff Size: Normal)   Temp 98.2 F (36.8 C) (Oral)   Ht 5' 3.5" (1.613 m)   Wt 187 lb (84.8 kg)   BMI 32.61 kg/m   Body mass index is 32.61 kg/m.  GENERAL: vitals reviewed and listed above, alert, oriented, appears well hydrated and in no acute distress HEENT: atraumatic, conjunctiva  clear, no obvious abnormalities on inspection of external nose and ears  Ext GU   Left  Labia inferior with quarter sized  Superficial boil cyst   With center dark point   But  Tender but non fluctuant?   No adenopathy no other lesions  Wt Readings from Last 3 Encounters:  04/13/16 187 lb (84.8 kg)  03/01/16 191 lb 3.2 oz (86.7 kg)  11/17/15 206 lb 14.4 oz (93.8 kg)   BP Readings from Last 3 Encounters:  04/13/16 136/72  03/01/16 (!) 154/80  11/17/15 (!) 144/78    ASSESSMENT AND PLAN:  Discussed the following assessment and plan:  Labium boil - Local  warm antibiotic don't think I&D would be successful although if gets larger contact us for  reeval   Essential hypertension Expectant management local care and plan for follow-up. She notes that her blood pressure is better and her weight is coming down she is trying to lose weight. -Patient advised to return or notify health care team  if symptoms worsen ,persist or new concerns arise.  Patient Instructions  This is a boil   Sometimes is an infected cyst   ( not rare)  Warm/hot compresses as much as possible  Add antibiotic  Expect improvement ment in the next 48 -72 hours    Fu if worse fever etc .       Standley Brooking. Panosh M.D.

## 2016-04-13 NOTE — Patient Instructions (Signed)
This is a boil   Sometimes is an infected cyst   ( not rare)  Warm/hot compresses as much as possible  Add antibiotic  Expect improvement ment in the next 48 -72 hours    Fu if worse fever etc .

## 2016-04-16 DIAGNOSIS — H35373 Puckering of macula, bilateral: Secondary | ICD-10-CM | POA: Diagnosis not present

## 2016-04-16 DIAGNOSIS — H401224 Low-tension glaucoma, left eye, indeterminate stage: Secondary | ICD-10-CM | POA: Diagnosis not present

## 2016-04-16 DIAGNOSIS — Z961 Presence of intraocular lens: Secondary | ICD-10-CM | POA: Diagnosis not present

## 2016-04-16 DIAGNOSIS — H401214 Low-tension glaucoma, right eye, indeterminate stage: Secondary | ICD-10-CM | POA: Diagnosis not present

## 2016-05-28 NOTE — Progress Notes (Signed)
Pre visit review using our clinic review tool, if applicable. No additional management support is needed unless otherwise documented below in the visit note.  Chief Complaint  Patient presents with  . Follow-up    HPI: Marie Kelly 71 y.o.   3 mos fu for ht management She is taking both medications without problem and no side effect she is also trying to do healthy diet and weight loss. Last blood pressure check was a couple months ago was good so she hasn't checked it. This morning had a number of stressful situations rushing into the appointment. ROS: See pertinent positives and negatives per HPI. No cp sob   Past Medical History:  Diagnosis Date  . Anemia   . Asthma   . 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 Colon polyps   . Complication of anesthesia    2 days after surgery-muscles hurtand spasmed-needed a muscle relaxant   . Diverticulosis   . Fracture of right olecranon process 10/26/2013  . GERD (gastroesophageal reflux disease)    had endo? taking nexium 3 x per week  . Hyperlipidemia    had been on lipitor changes to pravachol cause of cost and tricor has since stopped  NO  se reported   . Hypothyroidism     on meds for years  . Impaired fasting glucose   . Olecranon fracture 10/25/2013   right arm  . Ventricular bigeminy seen on cardiac monitor    Cardiac work up Dr Stanford Breed [facility]  . Wears dentures    top-partial bottom  . Wears glasses     Family History  Problem Relation Age of Onset  . Stroke Mother   . Diabetes Father   . Alzheimer's disease    . Stomach cancer Maternal Aunt   . Colon cancer Neg Hx   . Pancreatic cancer Neg Hx   . Heart disease Father     Social History   Social History  . Marital status: Married    Spouse name: N/A  . Number of children: 3  . Years of education: N/A   Social History Main Topics  . Smoking status: Former Smoker    Quit date: 10/25/1973  . Smokeless tobacco: Never  Used  . Alcohol use 0.0 oz/week     Comment: socially  . Drug use: No  . Sexual activity: Not Asked   Other Topics Concern  . 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    Gravida 3 para 3   Last td 6 2008   HS educated       REmote hx of tobacco 1.5 ppd for 6 years as a teen Q 64    Outpatient Medications Prior to Visit  Medication Sig Dispense Refill  . carvedilol (COREG) 6.25 MG tablet TAKE 1 TABLET BY MOUTH TWICE DAILY WITH A MEAL. 60 tablet 2  . levothyroxine (SYNTHROID, LEVOTHROID) 50 MCG tablet Take 1 tablet (50 mcg total) by mouth daily. 90 tablet 3  . montelukast (SINGULAIR) 10 MG tablet Take 1 tablet (10 mg total) by mouth at bedtime. 90 tablet 3  . tolterodine (DETROL LA) 2 MG 24 hr capsule Take 1 capsule (2 mg total) by mouth daily. 90 capsule  3  . valsartan (DIOVAN) 320 MG tablet Take 1 tablet (320 mg total) by mouth daily. 90 tablet 2  . doxycycline (VIBRA-TABS) 100 MG tablet Take 1 tablet (100 mg total) by mouth 2 (two) times daily. 20 tablet 0   No facility-administered medications prior to visit.      EXAM:  BP 128/78   Temp 98.2 F (36.8 C) (Oral)   Ht 5' 3.5" (1.613 m)   Wt 183 lb (83 kg)   BMI 31.91 kg/m   Body mass index is 31.91 kg/m.  GENERAL: vitals reviewed and listed above, alert, oriented, appears well hydrated and in no acute distress HEENT: atraumatic, conjunctiva  clear, no obvious abnormalities on inspection of external  CV rr  No CCedema  PSYCH: pleasant and cooperative, no obvious depression or anxiety BP Readings from Last 3 Encounters:  06/01/16 128/78  04/13/16 136/72  03/01/16 (!) 154/80   Wt Readings from Last 3 Encounters:  06/01/16 183 lb (83 kg)  04/13/16 187 lb (84.8 kg)  03/01/16 191 lb 3.2 oz (86.7 kg)   Lab Results  Component Value Date   WBC 5.3  11/17/2015   HGB 13.2 11/17/2015   HCT 39.1 11/17/2015   PLT 234.0 11/17/2015   GLUCOSE 97 11/17/2015   CHOL 210 (H) 02/25/2016   TRIG 179.0 (H) 02/25/2016   HDL 43.70 02/25/2016   LDLDIRECT 168.5 04/23/2013   LDLCALC 130 (H) 02/25/2016   ALT 20 11/17/2015   AST 14 11/17/2015   NA 137 11/17/2015   K 4.5 11/17/2015   CL 109 11/17/2015   CREATININE 0.76 11/17/2015   BUN 14 11/17/2015   CO2 27 11/17/2015   TSH 2.13 11/17/2015   HGBA1C 5.6 11/17/2015   BP Readings from Last 3 Encounters:  06/01/16 128/78  04/13/16 136/72  03/01/16 (!) 154/80    ASSESSMENT AND PLAN:  Discussed the following assessment and plan:  Essential hypertension  Medication management Blood pressure rechecked by provider. Improved at goal no significant side effects of medicines after intensification of medication and intensification of lifestyle intervention. Encourage current strategies plan. Labs at her wellness yearly checkup visit in July. Contact us in the meantime if concern side effects blood pressure rising again. -Patient advised to return or notify health care team  if symptoms worsen ,persist or new concerns arise.  Patient Instructions   Your repeat blood pressure in the office was much better 128/78 130/80. Continue same medicine and healthy weight loss. Check your blood pressure readings 1-2 days a week to make sure at goal. Best is 130/80 and below. Wt Readings from Last 3 Encounters:  06/01/16 183 lb (83 kg)  04/13/16 187 lb (84.8 kg)  03/01/16 191 lb 3.2 oz (86.7 kg)    Medicare wellness exam in July or as needed.you will be due for blood work at that time but it probably should be done at the visit. This is to avoid Medicare rejection of lab tests coverage .      Standley Brooking. Panosh M.D.

## 2016-06-01 ENCOUNTER — Ambulatory Visit (INDEPENDENT_AMBULATORY_CARE_PROVIDER_SITE_OTHER): Payer: Medicare Other | Admitting: Internal Medicine

## 2016-06-01 ENCOUNTER — Encounter: Payer: Self-pay | Admitting: Internal Medicine

## 2016-06-01 VITALS — BP 128/78 | Temp 98.2°F | Ht 63.5 in | Wt 183.0 lb

## 2016-06-01 DIAGNOSIS — Z79899 Other long term (current) drug therapy: Secondary | ICD-10-CM | POA: Diagnosis not present

## 2016-06-01 DIAGNOSIS — I10 Essential (primary) hypertension: Secondary | ICD-10-CM

## 2016-06-01 NOTE — Patient Instructions (Signed)
Your repeat blood pressure in the office was much better 128/78 130/80. Continue same medicine and healthy weight loss. Check your blood pressure readings 1-2 days a week to make sure at goal. Best is 130/80 and below. Wt Readings from Last 3 Encounters:  06/01/16 183 lb (83 kg)  04/13/16 187 lb (84.8 kg)  03/01/16 191 lb 3.2 oz (86.7 kg)    Medicare wellness exam in July or as needed.you will be due for blood work at that time but it probably should be done at the visit. This is to avoid Medicare rejection of lab tests coverage .

## 2016-06-28 IMAGING — CR DG LUMBAR SPINE COMPLETE 4+V
5 series · 5 of 5 positions shown · non-contrast
Comparison: None.

CLINICAL DATA: Low back pain for 2 weeks. Lumbago. No injury. No
sciatica. Persistent back pain.

EXAM:
LUMBAR SPINE - COMPLETE 4+ VIEW

[view not recorded (1 of 5)]
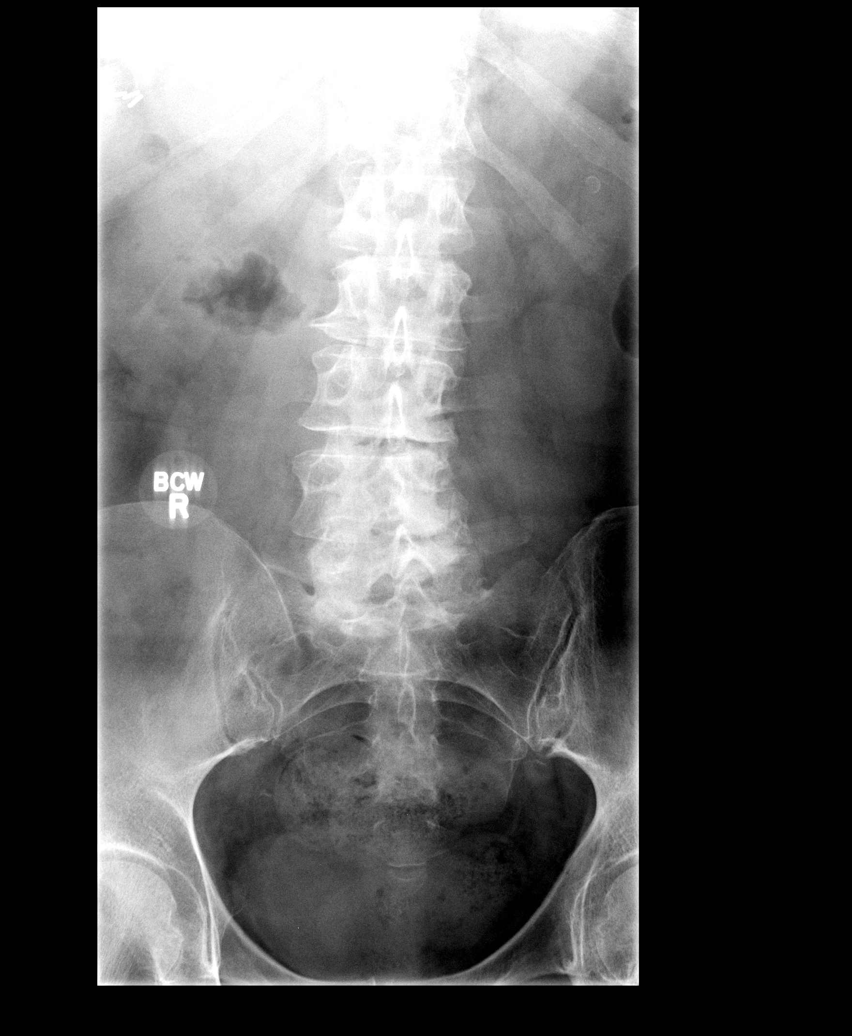

[view not recorded (2 of 5)]
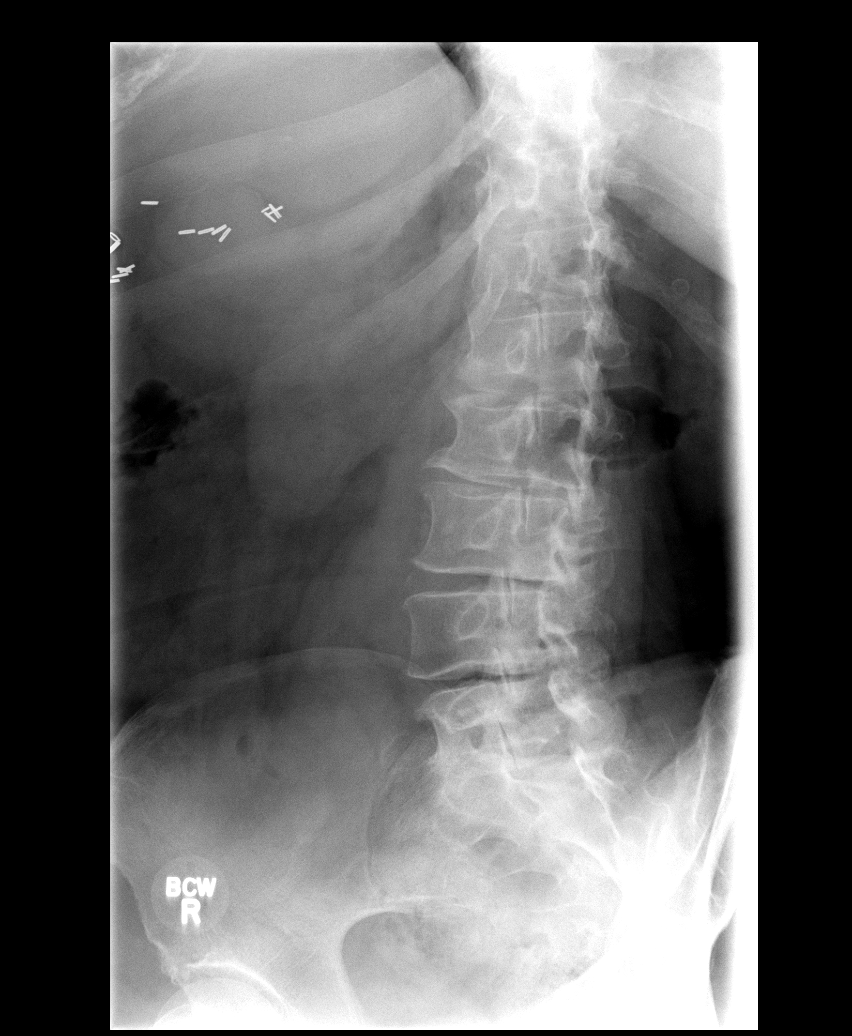

[view not recorded (3 of 5)]
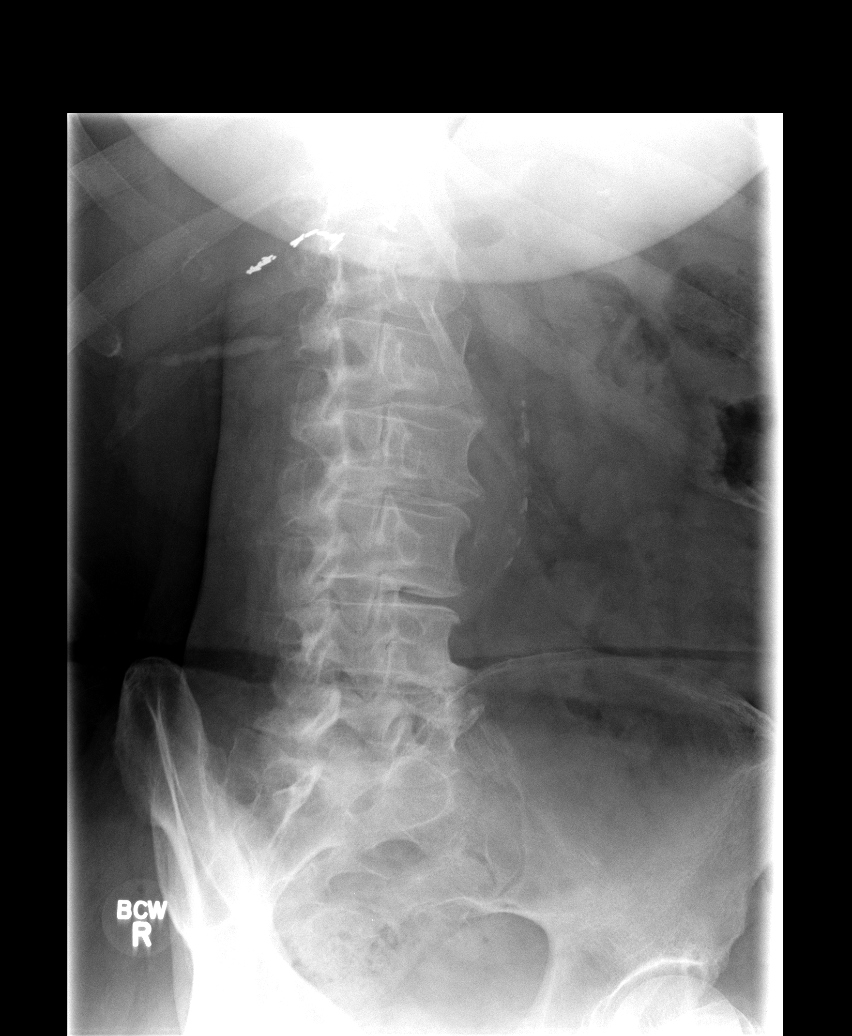

[view not recorded (4 of 5)]
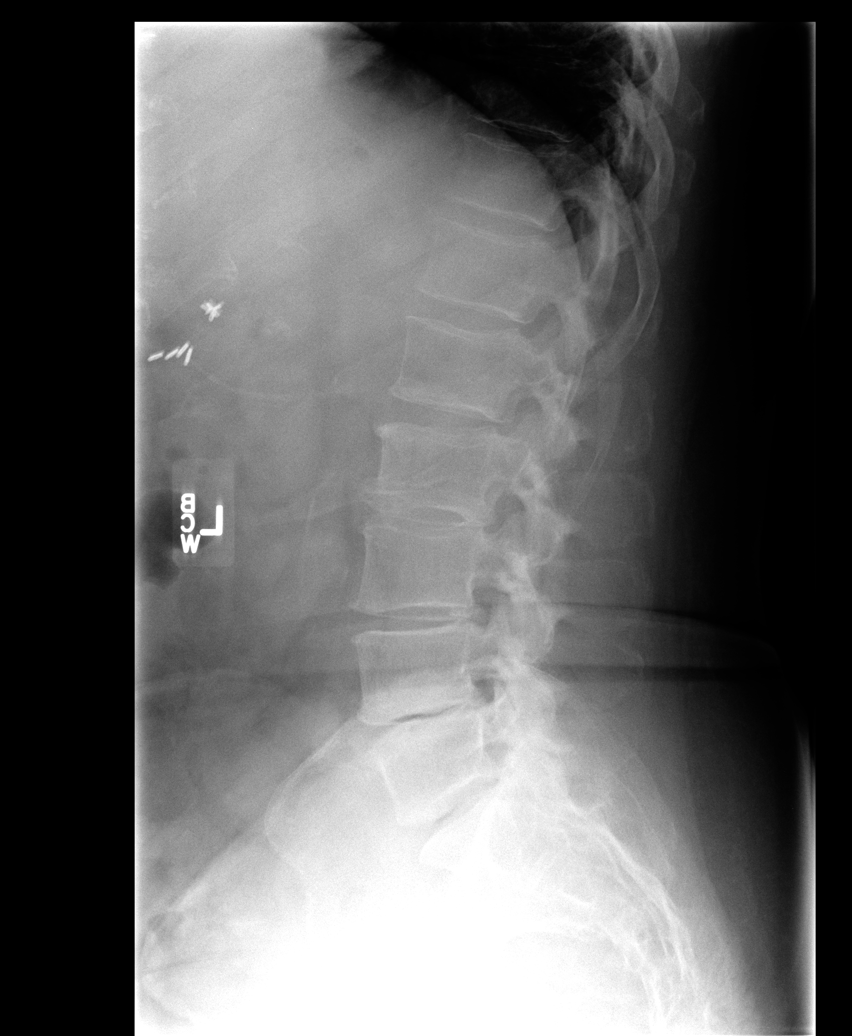

[view not recorded (5 of 5)]
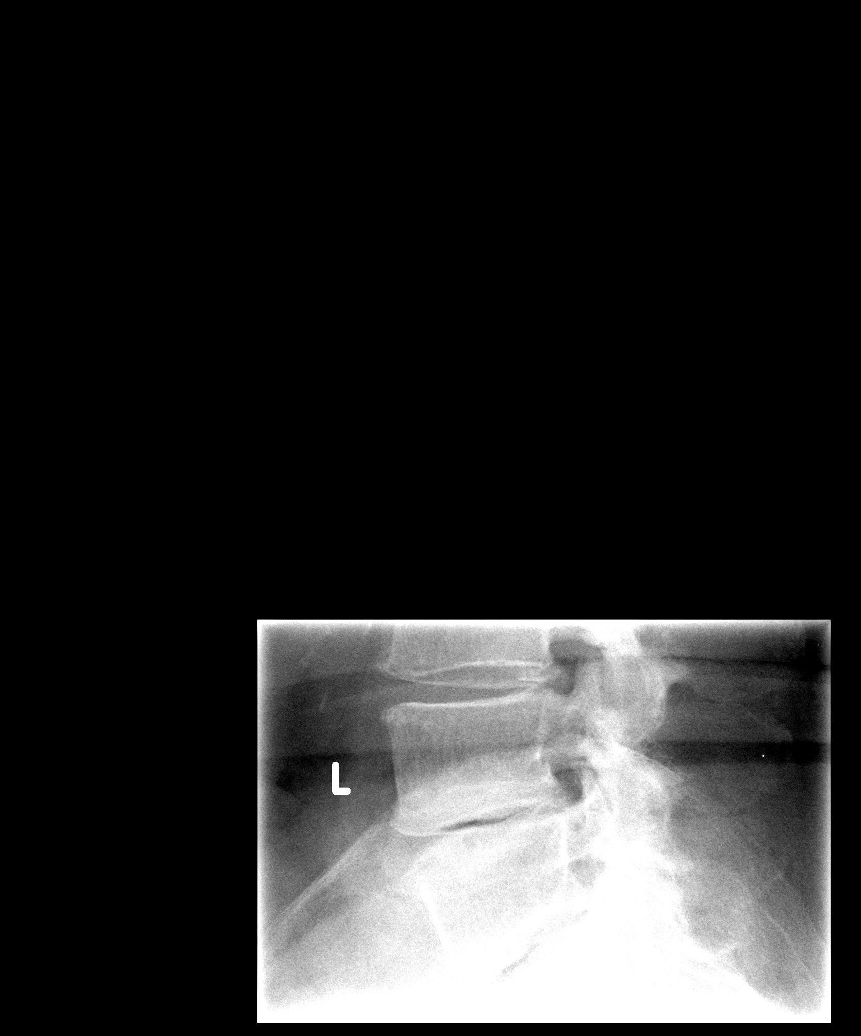

[5 of 5 positions shown; findings below may reference images not displayed]

FINDINGS: Mild dextroconvex curve is present with the apex at L4. Five lumbar
type vertebral bodies. Cholecystectomy clips are present in the
right upper quadrant.

Severe L4-L5 and L5-S1 degenerative disc disease is present. Vacuum
disc is present at both levels. Endplate sclerosis is present at
L4-L5. Mild degenerative disc disease at L2-L3 and L3-L4. Aortic
atherosclerosis is incidentally noted. Lower lumbar facet arthrosis
accompanies the degenerative disc disease.
IMPRESSION: Moderate lumbar degenerative disc and facet disease, most pronounced
at L4-L5 and L5-S1. No acute abnormality.

## 2016-06-30 ENCOUNTER — Other Ambulatory Visit: Payer: Self-pay | Admitting: Internal Medicine

## 2016-07-01 ENCOUNTER — Telehealth: Payer: Self-pay | Admitting: Family Medicine

## 2016-07-01 NOTE — Telephone Encounter (Signed)
Pt has been sch

## 2016-07-01 NOTE — Telephone Encounter (Signed)
Sent to the pharmacy by e-scribe.  Pt is scheduled for wellness with Manuela Schwartz in July.  Message sent to scheduling to help her make cpx with [name].

## 2016-07-01 NOTE — Telephone Encounter (Signed)
Pt is scheduled for a medicare wellness with Manuela Schwartz in July.  Also needs cpx with [name].  Have her come fasting for labs at visit.  Please help her to make appt.  Thanks!!!

## 2016-07-06 DIAGNOSIS — D485 Neoplasm of uncertain behavior of skin: Secondary | ICD-10-CM | POA: Diagnosis not present

## 2016-07-06 DIAGNOSIS — L814 Other melanin hyperpigmentation: Secondary | ICD-10-CM | POA: Diagnosis not present

## 2016-07-06 DIAGNOSIS — D235 Other benign neoplasm of skin of trunk: Secondary | ICD-10-CM | POA: Diagnosis not present

## 2016-07-06 DIAGNOSIS — D1801 Hemangioma of skin and subcutaneous tissue: Secondary | ICD-10-CM | POA: Diagnosis not present

## 2016-07-06 DIAGNOSIS — L820 Inflamed seborrheic keratosis: Secondary | ICD-10-CM | POA: Diagnosis not present

## 2016-07-06 DIAGNOSIS — L821 Other seborrheic keratosis: Secondary | ICD-10-CM | POA: Diagnosis not present

## 2016-09-02 ENCOUNTER — Other Ambulatory Visit: Payer: Self-pay | Admitting: Internal Medicine

## 2016-09-02 DIAGNOSIS — Z1231 Encounter for screening mammogram for malignant neoplasm of breast: Secondary | ICD-10-CM

## 2016-09-14 DIAGNOSIS — H43813 Vitreous degeneration, bilateral: Secondary | ICD-10-CM | POA: Diagnosis not present

## 2016-09-14 DIAGNOSIS — H35373 Puckering of macula, bilateral: Secondary | ICD-10-CM | POA: Diagnosis not present

## 2016-09-17 DIAGNOSIS — B009 Herpesviral infection, unspecified: Secondary | ICD-10-CM | POA: Diagnosis not present

## 2016-09-17 DIAGNOSIS — L0291 Cutaneous abscess, unspecified: Secondary | ICD-10-CM | POA: Diagnosis not present

## 2016-09-24 ENCOUNTER — Ambulatory Visit: Payer: Medicare Other

## 2016-10-08 ENCOUNTER — Ambulatory Visit: Payer: Medicare Other

## 2016-10-14 ENCOUNTER — Ambulatory Visit: Payer: Medicare Other

## 2016-10-15 ENCOUNTER — Ambulatory Visit
Admission: RE | Admit: 2016-10-15 | Discharge: 2016-10-15 | Disposition: A | Discharge status: Home / Self Care | Payer: Medicare Other | Source: Ambulatory Visit | Attending: Internal Medicine | Admitting: Internal Medicine

## 2016-10-15 DIAGNOSIS — H35341 Macular cyst, hole, or pseudohole, right eye: Secondary | ICD-10-CM | POA: Diagnosis not present

## 2016-10-15 DIAGNOSIS — H35371 Puckering of macula, right eye: Secondary | ICD-10-CM | POA: Diagnosis not present

## 2016-10-15 DIAGNOSIS — Z961 Presence of intraocular lens: Secondary | ICD-10-CM | POA: Diagnosis not present

## 2016-10-15 DIAGNOSIS — Z1231 Encounter for screening mammogram for malignant neoplasm of breast: Secondary | ICD-10-CM | POA: Diagnosis not present

## 2016-10-27 ENCOUNTER — Other Ambulatory Visit: Payer: Self-pay | Admitting: Internal Medicine

## 2016-11-14 HISTORY — PX: CATARACT EXTRACTION: SUR2

## 2016-11-18 NOTE — Progress Notes (Addendum)
Pre-visit review using our clinic review tool, if applicable. No additional management support is needed unless otherwise documented below in the visit note.  Above noted reviewed and agree. PANOSH,WANDA KOTVAN, MD   

## 2016-11-18 NOTE — Progress Notes (Signed)
Subjective:   Marie Kelly is a 71 y.o. female who presents for Medicare Annual (Subsequent) preventive examination.  Review of Systems:  No ROS.  Medicare Wellness Visit. Additional risk factors are reflected in the social history.  Cardiac Risk Factors include: diabetes mellitus;advanced age (>28men, >70 women);dyslipidemia;hypertension;obesity (BMI >30kg/m2)   Sleep patterns:  7 hrs/night. Gets up feeling tired. Husband wakes up early and wakes her up. Gets up every once in awhile to use restroom.  Home Safety/Smoke Alarms: Feels safe in home. Smoke alarms in place.  Living environment; residence and Firearm Safety: 3 steps to get into home. One story home. Lives with husband.  Seat Belt Safety/Bike Helmet: Wears seat belt.    Counseling:   Dental- Every 6 months. Upper dentures and partial lower.  Female:   Pap-  Aged out. Pt considering going to OBGYN.      Mammo-      10/15/2016 Negative.  Dexa scan-  09/25/2014  Osteopenia.    CCS- 02/05/2016 polyps removed.      Objective:     Vitals: BP 130/80 (BP Location: Right Arm, Patient Position: Sitting, Cuff Size: Normal)   Pulse (!) 58   Resp 16   Ht 5\' 4"  (1.626 m)   Wt 195 lb 6.4 oz (88.6 kg)   SpO2 97%   BMI 33.54 kg/m   Body mass index is 33.54 kg/m.   Tobacco History  Smoking Status  . Former Smoker  . Quit date: 10/25/1973  Smokeless Tobacco  . Never Used     Counseling given: Not Answered   Past Medical History:  Diagnosis Date  . Anemia   . Asthma   . 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 Colon polyps   . Complication of anesthesia    2 days after surgery-muscles hurtand spasmed-needed a muscle relaxant   . Diverticulosis   . Fracture of right olecranon process 10/26/2013  . GERD (gastroesophageal reflux disease)    had endo? taking nexium 3 x per week  . Hyperlipidemia    had been on lipitor changes to pravachol cause of cost and tricor has since stopped   NO  se reported   . Hypothyroidism     on meds for years  . Impaired fasting glucose   . Olecranon fracture 10/25/2013   right arm  . Ventricular bigeminy seen on cardiac monitor    Cardiac work up Dr Stanford Breed [facility]  . Wears dentures    top-partial bottom  . Wears glasses    Past Surgical History:  Procedure Laterality Date  . BELPHAROPTOSIS REPAIR Bilateral   . CHOLECYSTECTOMY    . COLONOSCOPY    . HARVEST BONE GRAFT Right 04/19/2014   Procedure: HARVEST LEFT TIBIA BONE GRAFT ;  Surgeon: Johnny Bridge, MD;  Location: [facility];  Service: Orthopedics;  Laterality: Right;  . HEMORROIDECTOMY    . ORIF ELBOW FRACTURE Right 10/26/2013   Procedure: RIGHT ELBOW FRACTUE OPEN TREATMENT ULNAR PROXIMAL END OLECRANON PROCESS INCLUDES INTERNAL FIXATION;  Surgeon: Johnny Bridge, MD;  Location: [facility];  Service: Orthopedics;  Laterality: Right;  . ORIF ELBOW FRACTURE Right 04/19/2014   Procedure: OPEN REDUCTION INTERNAL FIXATION (ORIF) RIGHT ELBOW/OLECRANON NONUNION FRACTURE WITH HARDWARE REMOVAL;  Surgeon: Johnny Bridge, MD;  Location: [facility];  Service: Orthopedics;  Laterality: Right;  . TONSILLECTOMY    . TUBAL LIGATION    . uterus repair  Family History  Problem Relation Age of Onset  . Stroke Mother   . Diabetes Father   . Heart disease Father   . Alzheimer's disease Unknown   . Stomach cancer Maternal Aunt   . Colon cancer Neg Hx   . Pancreatic cancer Neg Hx    History  Sexual Activity  . Sexual activity: Not on file    Outpatient Encounter Prescriptions as of 11/22/2016  Medication Sig  . carvedilol (COREG) 6.25 MG tablet TAKE 1 TABLET BY MOUTH TWICE DAILY WITH A MEAL.  Marland Kitchen levothyroxine (SYNTHROID, LEVOTHROID) 50 MCG tablet TAKE 1 TABLET DAILY  . montelukast (SINGULAIR) 10 MG tablet TAKE 1 TABLET AT BEDTIME  . tolterodine (DETROL LA) 2 MG 24 hr capsule TAKE 1 CAPSULE DAILY  . valsartan (DIOVAN) 320 MG tablet Take  1 tablet (320 mg total) by mouth daily.   No facility-administered encounter medications on file as of 11/22/2016.     Activities of Daily Living In your present state of health, do you have any difficulty performing the following activities: 11/22/2016  Hearing? N  Vision? N  Difficulty concentrating or making decisions? N  Walking or climbing stairs? Y  Dressing or bathing? N  Doing errands, shopping? N  Preparing Food and eating ? N  Using the Toilet? N  In the past six months, have you accidently leaked urine? N  Do you have problems with loss of bowel control? N  Managing your Medications? N  Managing your Finances? N  Housekeeping or managing your Housekeeping? N  Some recent data might be hidden    Patient Care Team: Panosh, Standley Brooking, MD as PCP - General (Internal Medicine) Shon Hough, MD (Ophthalmology) Marchia Bond, MD as Consulting Physician (Orthopedic Surgery)    Assessment:    Physical assessment deferred to PCP.  Exercise Activities and Dietary recommendations Current Exercise Habits: The patient does not participate in regular exercise at present, Exercise limited by: None identified   Diet (meal preparation, eat out, water intake, caffeinated beverages, dairy products, fruits and vegetables):1-2 meals/day. Tries to to intermittent fasting. Pt states she drinks a lot of water throughout day. Drinks sugar free lemonade during day. Breakfast: Skips. 1-2 cups black coffee.  Lunch: Tuna and avocado or salad. Limits bread Dinner:  Shrimp with rice pasta.  Husband recently had open heart surgery so she has been changing her diet to a heart healthy diet with him.   Pt currently reading [employer].  Goals    . Get down to 150 lbs.      Fall Risk Fall Risk  11/22/2016 11/17/2015 09/18/2014 09/18/2014 04/23/2013  Falls in the past year? No No Yes Yes Yes  Number falls in past yr: - - 1 1 1   Injury with Fall? - - Yes Yes Yes  Follow up - - Falls evaluation  completed - -   Depression Screen PHQ 2/9 Scores 11/22/2016 11/17/2015 09/18/2014 04/23/2013  PHQ - 2 Score 0 0 0 0     Cognitive Function   Ad8 score reviewed for issues:  Issues making decisions:no  Less interest in hobbies / activities:no  Repeats questions, stories (family complaining):no  Trouble using ordinary gadgets (microwave, computer, phone):no  Forgets the month or year: no  Mismanaging finances: no  Remembering appts:no  Daily problems with thinking and/or memory:no Ad8 score is=0      Immunization History  Administered Date(s) Administered  . Influenza Split 02/14/2013  . Influenza, High Dose Seasonal PF 03/05/2016  . Influenza-Unspecified  01/15/2014  . Pneumococcal Conjugate-13 04/23/2013  . Pneumococcal Polysaccharide-23 09/18/2014  . Tdap 09/09/2011   Screening Tests Health Maintenance  Topic Date Due  . INFLUENZA VACCINE  12/15/2016  . MAMMOGRAM  10/16/2018  . TETANUS/TDAP  09/08/2021  . COLONOSCOPY  02/04/2026  . DEXA SCAN  Completed  . Hepatitis C Screening  Completed  . PNA vac Low Risk Adult  Completed      Plan:    Bring a copy of your advance directives to your next office visit.  Schedule Bone Density exam.  Pt is going to see an OBGYN.   I have personally reviewed and noted the following in the patient's chart:   . Medical and social history . Use of alcohol, tobacco or illicit drugs  . Current medications and supplements . Functional ability and status . Nutritional status . Physical activity . Advanced directives . List of other physicians . Vitals . Screenings to include cognitive, depression, and falls . Referrals and appointments  In addition, I have reviewed and discussed with patient certain preventive protocols, quality metrics, and best practice recommendations. A written personalized care plan for preventive services as well as general preventive health recommendations were provided to patient.     Ree Edman, RN  11/22/2016

## 2016-11-22 ENCOUNTER — Ambulatory Visit (INDEPENDENT_AMBULATORY_CARE_PROVIDER_SITE_OTHER): Payer: Medicare Other

## 2016-11-22 VITALS — BP 130/80 | HR 58 | Resp 16 | Ht 64.0 in | Wt 195.4 lb

## 2016-11-22 DIAGNOSIS — Z0000 Encounter for general adult medical examination without abnormal findings: Secondary | ICD-10-CM

## 2016-11-22 DIAGNOSIS — Z780 Asymptomatic menopausal state: Secondary | ICD-10-CM

## 2016-11-22 NOTE — Patient Instructions (Addendum)
Bring a copy of your advance directives to your next office visit. Schedule your bone density.   Preventive Care 72 Years and Older, Female Preventive care refers to lifestyle choices and visits with your health care provider that can promote health and wellness. What does preventive care include?  A yearly physical exam. This is also called an annual well check.  Dental exams once or twice a year.  Routine eye exams. Ask your health care provider how often you should have your eyes checked.  Personal lifestyle choices, including: ? Daily care of your teeth and gums. ? Regular physical activity. ? Eating a healthy diet. ? Avoiding tobacco and drug use. ? Limiting alcohol use. ? Practicing safe sex. ? Taking low-dose aspirin every day. ? Taking vitamin and mineral supplements as recommended by your health care provider. What happens during an annual well check? The services and screenings done by your health care provider during your annual well check will depend on your age, overall health, lifestyle risk factors, and family history of disease. Counseling Your health care provider may ask you questions about your:  Alcohol use.  Tobacco use.  Drug use.  Emotional well-being.  Home and relationship well-being.  Sexual activity.  Eating habits.  History of falls.  Memory and ability to understand (cognition).  Work and work Statistician.  Reproductive health.  Screening You may have the following tests or measurements:  Height, weight, and BMI.  Blood pressure.  Lipid and cholesterol levels. These may be checked every 5 years, or more frequently if you are over 45 years old.  Skin check.  Lung cancer screening. You may have this screening every year starting at age 13 if you have a 30-pack-year history of smoking and currently smoke or have quit within the past 15 years.  Fecal occult blood test (FOBT) of the stool. You may have this test every year starting  at age 63.  Flexible sigmoidoscopy or colonoscopy. You may have a sigmoidoscopy every 5 years or a colonoscopy every 10 years starting at age 47.  Hepatitis C blood test.  Hepatitis B blood test.  Sexually transmitted disease (STD) testing.  Diabetes screening. This is done by checking your blood sugar (glucose) after you have not eaten for a while (fasting). You may have this done every 1-3 years.  Bone density scan. This is done to screen for osteoporosis. You may have this done starting at age 13.  Mammogram. This may be done every 1-2 years. Talk to your health care provider about how often you should have regular mammograms.  Talk with your health care provider about your test results, treatment options, and if necessary, the need for more tests. Vaccines Your health care provider may recommend certain vaccines, such as:  Influenza vaccine. This is recommended every year.  Tetanus, diphtheria, and acellular pertussis (Tdap, Td) vaccine. You may need a Td booster every 10 years.  Varicella vaccine. You may need this if you have not been vaccinated.  Zoster vaccine. You may need this after age 26.  Measles, mumps, and rubella (MMR) vaccine. You may need at least one dose of MMR if you were born in 1957 or later. You may also need a second dose.  Pneumococcal 13-valent conjugate (PCV13) vaccine. One dose is recommended after age 87.  Pneumococcal polysaccharide (PPSV23) vaccine. One dose is recommended after age 45.  Meningococcal vaccine. You may need this if you have certain conditions.  Hepatitis A vaccine. You may need this if you  have certain conditions or if you travel or work in places where you may be exposed to hepatitis A.  Hepatitis B vaccine. You may need this if you have certain conditions or if you travel or work in places where you may be exposed to hepatitis B.  Haemophilus influenzae type b (Hib) vaccine. You may need this if you have certain  conditions.  Talk to your health care provider about which screenings and vaccines you need and how often you need them. This information is not intended to replace advice given to you by your health care provider. Make sure you discuss any questions you have with your health care provider. Document Released: 05/30/2015 Document Revised: 01/21/2016 Document Reviewed: 03/04/2015 Elsevier Interactive Patient Education  2017 [place] [facility]-healthy meal planning includes:  Limiting unhealthy fats.  Increasing healthy fats.  Making other small dietary changes.  You may need to talk with your doctor or a diet specialist (dietitian) to create an eating plan that is right for you. What types of fat should I choose?  Choose healthy fats. These include olive oil and canola oil, flaxseeds, walnuts, almonds, and seeds.  Eat more omega-3 fats. These include salmon, mackerel, sardines, tuna, flaxseed oil, and ground flaxseeds. Try to eat fish at least twice each week.  Limit saturated fats. ? Saturated fats are often found in animal products, such as meats, butter, and cream. ? Plant sources of saturated fats include palm oil, palm kernel oil, and coconut oil.  Avoid foods with partially hydrogenated oils in them. These include stick margarine, some tub margarines, cookies, crackers, and other baked goods. These contain trans fats. What general guidelines do I need to follow?  Check food labels carefully. Identify foods with trans fats or high amounts of saturated fat.  Fill one half of your plate with vegetables and green salads. Eat 4-5 servings of vegetables per day. A serving of vegetables is: ? 1 cup of raw leafy vegetables. ?  cup of raw or cooked cut-up vegetables. ?  cup of vegetable juice.  Fill one fourth of your plate with whole grains. Look for the word "whole" as the first word in the ingredient list.  Fill one fourth of your plate with lean  protein foods.  Eat 4-5 servings of fruit per day. A serving of fruit is: ? One medium whole fruit. ?  cup of dried fruit. ?  cup of fresh, frozen, or canned fruit. ?  cup of 100% fruit juice.  Eat more foods that contain soluble fiber. These include apples, broccoli, carrots, beans, peas, and barley. Try to get 20-30 g of fiber per day.  Eat more home-cooked food. Eat less restaurant, buffet, and fast food.  Limit or avoid alcohol.  Limit foods high in starch and sugar.  Avoid fried foods.  Avoid frying your food. Try baking, boiling, grilling, or broiling it instead. You can also reduce fat by: ? Removing the skin from poultry. ? Removing all visible fats from meats. ? Skimming the fat off of stews, soups, and gravies before serving them. ? Steaming vegetables in water or broth.  Lose weight if you are overweight.  Eat 4-5 servings of nuts, legumes, and seeds per week: ? One serving of dried beans or legumes equals  cup after being cooked. ? One serving of nuts equals 1 ounces. ? One serving of seeds equals  ounce or one tablespoon.  You may need to keep track of how much salt or  sodium you eat. This is especially true if you have high blood pressure. Talk with your doctor or dietitian to get more information. What foods can I eat? Grains Breads, including [place], white, pita, wheat, raisin, rye, oatmeal, and [place]. Tortillas that are neither fried nor made with lard or trans fat. Low-fat rolls, including hotdog and hamburger buns and English muffins. Biscuits. Muffins. Waffles. Pancakes. Light popcorn. Whole-grain cereals. Flatbread. Melba toast. Pretzels. Breadsticks. Rusks. Low-fat snacks. Low-fat crackers, including oyster, saltine, matzo, graham, animal, and rye. Rice and pasta, including brown rice and pastas that are made with whole wheat. Vegetables All vegetables. Fruits All fruits, but limit coconut. Meats and Other Protein Sources Lean, well-trimmed beef,  veal, pork, and lamb. Chicken and [place] without skin. All fish and shellfish. Wild duck, rabbit, pheasant, and venison. Egg whites or low-cholesterol egg substitutes. Dried beans, peas, lentils, and tofu. Seeds and most nuts. Dairy Low-fat or nonfat cheeses, including ricotta, string, and mozzarella. Skim or 1% milk that is liquid, powdered, or evaporated. Buttermilk that is made with low-fat milk. Nonfat or low-fat yogurt. Beverages Mineral water. Diet carbonated beverages. Sweets and Desserts Sherbets and fruit ices. Honey, jam, marmalade, jelly, and syrups. Meringues and gelatins. Pure sugar candy, such as hard candy, jelly beans, gumdrops, mints, marshmallows, and small amounts of dark chocolate. [employer] Eat all sweets and desserts in moderation. Fats and Oils Nonhydrogenated (trans-free) margarines. Vegetable oils, including soybean, sesame, sunflower, olive, peanut, safflower, corn, canola, and cottonseed. Salad dressings or mayonnaise made with a vegetable oil. Limit added fats and oils that you use for cooking, baking, salads, and as spreads. Other Cocoa powder. Coffee and tea. All seasonings and condiments. The items listed above may not be a complete list of recommended foods or beverages. Contact your dietitian for more options. What foods are not recommended? Grains Breads that are made with saturated or trans fats, oils, or whole milk. Croissants. Butter rolls. Cheese breads. Sweet rolls. Donuts. Buttered popcorn. Chow mein noodles. High-fat crackers, such as cheese or butter crackers. Meats and Other Protein Sources Fatty meats, such as hotdogs, short ribs, sausage, spareribs, bacon, rib eye roast or steak, and mutton. High-fat deli meats, such as salami and bologna. Caviar. Domestic duck and goose. Organ meats, such as kidney, liver, sweetbreads, and heart. Dairy Cream, sour cream, cream cheese, and creamed cottage cheese. Whole-milk cheeses, including blue (bleu),  [place], [place], [place], American, [place], Swiss, cheddar, [place], and [place]. Whole or 2% milk that is liquid, evaporated, or condensed. Whole buttermilk. Cream sauce or high-fat cheese sauce. Yogurt that is made from whole milk. Beverages Regular sodas and juice drinks with added sugar. Sweets and Desserts Frosting. Pudding. Cookies. Cakes other than angel food cake. Candy that has milk chocolate or white chocolate, hydrogenated fat, butter, coconut, or unknown ingredients. Buttered syrups. Full-fat ice cream or ice cream drinks. Fats and Oils Gravy that has suet, meat fat, or shortening. Cocoa butter, hydrogenated oils, palm oil, coconut oil, palm kernel oil. These can often be found in baked products, candy, fried foods, nondairy creamers, and whipped toppings. Solid fats and shortenings, including bacon fat, salt pork, lard, and butter. Nondairy cream substitutes, such as coffee creamers and sour cream substitutes. Salad dressings that are made of unknown oils, cheese, or sour cream. The items listed above may not be a complete list of foods and beverages to avoid. Contact your dietitian for more information. This information is not intended to replace advice given to you by your health  care provider. Make sure you discuss any questions you have with your health care provider. Document Released: 11/02/2011 Document Revised: 10/09/2015 Document Reviewed: 10/25/2013 Elsevier Interactive Patient Education  [employer].

## 2016-11-24 ENCOUNTER — Other Ambulatory Visit: Payer: Self-pay | Admitting: Internal Medicine

## 2016-11-29 NOTE — Progress Notes (Signed)
Chief Complaint  Patient presents with  . Hypertension  . Hypothyroidism  . Hyperlipidemia  . Medication Management    HPI: Marie Kelly 71 y.o. come in for  Yearly visit  Chronic disease management and meds   Since her last visit her husband has had coronary artery bypass may begin health problems but has been difficult because he is hesitant to do the healthy things. She is not walking as much does swim. Blood pressure taking medications readings brought in log. Some are at goal summer elevated. No side effects of medicines reported. Thyroid taking medicines daily no side effects noted. Planning to see GYN has a past history of abnormal Pap may be overdue. Neg td ocass etoh sleep 7 hours  No rd  ROS: See pertinent positives and negatives per HPI.  Past Medical History:  Diagnosis Date  . Anemia   . Asthma   . 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 Colon polyps   . Complication of anesthesia    2 days after surgery-muscles hurtand spasmed-needed a muscle relaxant   . Diverticulosis   . Fracture of right olecranon process 10/26/2013  . GERD (gastroesophageal reflux disease)    had endo? taking nexium 3 x per week  . Hyperlipidemia    had been on lipitor changes to pravachol cause of cost and tricor has since stopped  NO  se reported   . Hypothyroidism     on meds for years  . Impaired fasting glucose   . Olecranon fracture 10/25/2013   right arm  . Ventricular bigeminy seen on cardiac monitor    Cardiac work up Dr Stanford Breed [facility]  . Wears dentures    top-partial bottom  . Wears glasses     Family History  Problem Relation Age of Onset  . Stroke Mother   . Diabetes Father   . Heart disease Father   . Alzheimer's disease Unknown   . Stomach cancer Maternal Aunt   . Colon cancer Neg Hx   . Pancreatic cancer Neg Hx     Social History   Social History  . Marital status: Married    Spouse name: N/A  . Number of  children: 3  . Years of education: N/A   Social History Main Topics  . Smoking status: Former Smoker    Quit date: 10/25/1973  . Smokeless tobacco: Never Used  . Alcohol use 0.0 oz/week     Comment: socially  . Drug use: No  . Sexual activity: Not Asked   Other Topics Concern  . 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    Gravida 3 para 3   Last td 6 2008   HS educated       REmote hx of tobacco 1.5 ppd for 6 years as a teen Q 60    Outpatient Medications Prior to Visit  Medication Sig Dispense Refill  . carvedilol (COREG) 6.25 MG tablet TAKE 1 TABLET BY MOUTH TWICE DAILY WITH A MEAL. 180 tablet 1  . levothyroxine (SYNTHROID, LEVOTHROID) 50 MCG tablet TAKE 1 TABLET DAILY 90 tablet 0  . montelukast (SINGULAIR) 10 MG tablet TAKE 1 TABLET AT BEDTIME 90 tablet 0  . tolterodine (DETROL  LA) 2 MG 24 hr capsule TAKE 1 CAPSULE DAILY 90 capsule 0  . valsartan (DIOVAN) 320 MG tablet TAKE ONE TABLET BY MOUTH DAILY 30 tablet 1   No facility-administered medications prior to visit.      EXAM:  BP 130/70 (BP Location: Right Arm, Patient Position: Sitting, Cuff Size: Normal)   Pulse (!) 53   Temp 97.6 F (36.4 C) (Oral)   Ht 5\' 3"  (1.6 m)   Wt 189 lb 9.6 oz (86 kg)   BMI 33.59 kg/m   Body mass index is 33.59 kg/m.   Physical Exam: Vital signs reviewed ZWC:HENI is a well-developed well-nourished alert cooperative  female who appears her stated age in no acute distress.  HEENT: normocephalic atraumatic , Eyes: PERRL EOM's full, conjunctiva clear, Nares: paten,t no deformity discharge or tenderness., Ears: no deformity EAC's clear TMs with normal landmarks. Mouth: clear OP, no lesions, edema.  Moist mucous membranes. Dentition in adequate repair. NECK: supple without masses, thyromegaly or bruits.Breast: normal by  inspection . No dimpling, discharge, masses, tenderness or discharge . CHEST/PULM:  Clear to auscultation and percussion breath sounds equal no wheeze , rales or rhonchi. No chest wall deformities or tenderness. CV: PMI is nondisplaced, S1 S2 no gallops, murmurs, rubs. Peripheral pulses are full without delay.No JVD .  ABDOMEN: Bowel sounds normal nontender  No guard or rebound, no hepato splenomegal no CVA tenderness.   Extremtities:  No clubbing cyanosis or edema, no acute joint swelling or redness no focal atrophy NEURO:  Oriented x3, cranial nerves 3-12 appear to be intact, no obvious focal weakness,gait within normal limits no abnormal reflexes or asymmetrical SKIN: No acute rashes normal turgor, color, no bruising or petechiae. PSYCH: Oriented, good eye contact, no obvious depression anxiety, cognition and judgment appear normal. LN: no cervical axillary inguinal adenopathy    bp log reviewed  Lab Results  Component Value Date   WBC 5.3 11/17/2015   HGB 13.2 11/17/2015   HCT 39.1 11/17/2015   PLT 234.0 11/17/2015   GLUCOSE 97 11/17/2015   CHOL 210 (H) 02/25/2016   TRIG 179.0 (H) 02/25/2016   HDL 43.70 02/25/2016   LDLDIRECT 168.5 04/23/2013   LDLCALC 130 (H) 02/25/2016   ALT 20 11/17/2015   AST 14 11/17/2015   NA 137 11/17/2015   K 4.5 11/17/2015   CL 109 11/17/2015   CREATININE 0.76 11/17/2015   BUN 14 11/17/2015   CO2 27 11/17/2015   TSH 2.13 11/17/2015   HGBA1C 5.6 11/17/2015   BP Readings from Last 3 Encounters:  11/30/16 130/70  11/22/16 130/80  06/01/16 128/78   The 10-year ASCVD risk score Mikey Bussing DC Jr., et al., 2013) is: 15.1%   Values used to calculate the score:     Age: 85 years     Sex: Female     Is Non-Hispanic African American: No     Diabetic: No     Tobacco smoker: No     Systolic Blood Pressure: 778 mmHg     Is BP treated: Yes     HDL Cholesterol: 43.7 mg/dL     Total Cholesterol: 210 mg/dL  ASSESSMENT AND PLAN:  Discussed the following  assessment and plan:  Essential hypertension - mostly controlled up some recent family illnses stress - Plan: Basic metabolic panel, CBC with Differential/Platelet, Hemoglobin A1c, Hepatic function panel, Lipid panel, TSH  Medication management - Plan: Basic metabolic panel, CBC with Differential/Platelet, Hemoglobin A1c, Hepatic function panel, Lipid panel, TSH  Hyperlipidemia, unspecified hyperlipidemia  type - hesitant to use medication will follow lsio  - Plan: Basic metabolic panel, CBC with Differential/Platelet, Hemoglobin A1c, Hepatic function panel, Lipid panel, TSH  Impaired fasting glucose - Plan: Basic metabolic panel, CBC with Differential/Platelet, Hemoglobin A1c, Hepatic function panel, Lipid panel, TSH  Hypothyroidism, unspecified type - monitor - Plan: Basic metabolic panel, CBC with Differential/Platelet, Hemoglobin A1c, Hepatic function panel, Lipid panel, TSH [place] gyne check  rov depending  on labs  And how doing  -Patient advised to return or notify health care team  if  new concerns arise.  Patient Instructions  Will notify you  of labs when available.  New shingles vaccine  bp goal is 120- 130 /80if possible .  consdieration of statin medication  If lipids are elevated again .   The 10-year ASCVD risk score Mikey Bussing DC Brooke Bonito., et al., 2013) is: 15.1%   Values used to calculate the score:     Age: 46 years     Sex: Female     Is Non-Hispanic African American: No     Diabetic: No     Tobacco smoker: No     Systolic Blood Pressure: 115 mmHg     Is BP treated: Yes     HDL Cholesterol: 43.7 mg/dL     Total Cholesterol: 210 mg/dL       Standley Brooking. Chibuikem Thang M.D.

## 2016-11-30 ENCOUNTER — Ambulatory Visit (INDEPENDENT_AMBULATORY_CARE_PROVIDER_SITE_OTHER): Payer: Medicare Other | Admitting: Internal Medicine

## 2016-11-30 ENCOUNTER — Telehealth: Payer: Self-pay | Admitting: Internal Medicine

## 2016-11-30 ENCOUNTER — Encounter: Payer: Self-pay | Admitting: Internal Medicine

## 2016-11-30 VITALS — BP 130/70 | HR 53 | Temp 97.6°F | Ht 63.0 in | Wt 189.6 lb

## 2016-11-30 DIAGNOSIS — E785 Hyperlipidemia, unspecified: Secondary | ICD-10-CM

## 2016-11-30 DIAGNOSIS — R7301 Impaired fasting glucose: Secondary | ICD-10-CM | POA: Diagnosis not present

## 2016-11-30 DIAGNOSIS — Z79899 Other long term (current) drug therapy: Secondary | ICD-10-CM | POA: Diagnosis not present

## 2016-11-30 DIAGNOSIS — E039 Hypothyroidism, unspecified: Secondary | ICD-10-CM | POA: Diagnosis not present

## 2016-11-30 DIAGNOSIS — I10 Essential (primary) hypertension: Secondary | ICD-10-CM | POA: Diagnosis not present

## 2016-11-30 LAB — LIPID PANEL
CHOLESTEROL: 206 mg/dL — AB (ref 0–200)
HDL: 48.7 mg/dL (ref >39.00)
LDL Cholesterol: 135 mg/dL — ABNORMAL HIGH (ref 0–99)
NONHDL: 157.14
TRIGLYCERIDES: 111 mg/dL (ref 0.0–149.0)
Total CHOL/HDL Ratio: 4
VLDL: 22.2 mg/dL (ref 0.0–40.0)

## 2016-11-30 LAB — HEMOGLOBIN A1C: HEMOGLOBIN A1C: 5.5 % (ref 4.6–6.5)

## 2016-11-30 LAB — BASIC METABOLIC PANEL
BUN: 9 mg/dL (ref 6–23)
CALCIUM: 9.9 mg/dL (ref 8.4–10.5)
CO2: 29 mEq/L (ref 19–32)
Chloride: 101 mEq/L (ref 96–112)
Creatinine, Ser: 0.77 mg/dL (ref 0.40–1.20)
GFR: 78.41 mL/min (ref >60.00)
GLUCOSE: 113 mg/dL — AB (ref 70–99)
POTASSIUM: 4.7 meq/L (ref 3.5–5.1)
Sodium: 135 mEq/L (ref 135–145)

## 2016-11-30 LAB — CBC WITH DIFFERENTIAL/PLATELET
Basophils Absolute: 0 10*3/uL (ref 0.0–0.1)
Basophils Relative: 0.4 % (ref 0.0–3.0)
EOS PCT: 10.2 % — AB (ref 0.0–5.0)
Eosinophils Absolute: 0.5 10*3/uL (ref 0.0–0.7)
HCT: 38.2 % (ref 36.0–46.0)
Hemoglobin: 12.9 g/dL (ref 12.0–15.0)
LYMPHS ABS: 1.2 10*3/uL (ref 0.7–4.0)
Lymphocytes Relative: 27.4 % (ref 12.0–46.0)
MCHC: 33.8 g/dL (ref 30.0–36.0)
MCV: 85.7 fl (ref 78.0–100.0)
MONO ABS: 0.5 10*3/uL (ref 0.1–1.0)
Monocytes Relative: 10.5 % (ref 3.0–12.0)
NEUTROS PCT: 51.5 % (ref 43.0–77.0)
Neutro Abs: 2.3 10*3/uL (ref 1.4–7.7)
Platelets: 253 10*3/uL (ref 150.0–400.0)
RBC: 4.45 Mil/uL (ref 3.87–5.11)
RDW: 13.6 % (ref 11.5–15.5)
WBC: 4.5 10*3/uL (ref 4.0–10.5)

## 2016-11-30 LAB — HEPATIC FUNCTION PANEL
ALBUMIN: 4.4 g/dL (ref 3.5–5.2)
ALK PHOS: 41 U/L (ref 39–117)
ALT: 26 U/L (ref 0–35)
AST: 23 U/L (ref 0–37)
Bilirubin, Direct: 0.1 mg/dL (ref 0.0–0.3)
Total Bilirubin: 0.7 mg/dL (ref 0.2–1.2)
Total Protein: 7.1 g/dL (ref 6.0–8.3)

## 2016-11-30 LAB — TSH: TSH: 3.76 u[IU]/mL (ref 0.35–4.50)

## 2016-11-30 NOTE — Telephone Encounter (Signed)
Last refilled 01/15/2015

## 2016-11-30 NOTE — Telephone Encounter (Signed)
Left a detailed VM for patient to contact the pharmacy first and too see when manufacture is making her medication and then give the office a call back.

## 2016-11-30 NOTE — Telephone Encounter (Signed)
Have her  document that  She has contacted her  pharmacy  And that states that her particular batch was a recall in question.   If they can change the valsartan to another manufacturer that would be fine   otherwise we would have to change her medicine altogether. Reassured her that valsartan has no evidence of causing cancer.

## 2016-11-30 NOTE — Patient Instructions (Addendum)
Will notify you  of labs when available.  New shingles vaccine  bp goal is 120- 130 /80if possible .  consdieration of statin medication  If lipids are elevated again .   The 10-year ASCVD risk score Mikey Bussing DC Brooke Bonito., et al., 2013) is: 15.1%   Values used to calculate the score:     Age: 71 years     Sex: Female     Is Non-Hispanic African American: No     Diabetic: No     Tobacco smoker: No     Systolic Blood Pressure: 672 mmHg     Is BP treated: Yes     HDL Cholesterol: 43.7 mg/dL     Total Cholesterol: 210 mg/dL

## 2016-11-30 NOTE — Telephone Encounter (Signed)
Pt medication is recalled valsartan. [facility] [address]. Please advise. Pt was seen today

## 2016-12-01 NOTE — Telephone Encounter (Signed)
PT is calling to let Marie Kelly her manufacture of valsartan is not on recall list

## 2016-12-01 NOTE — Telephone Encounter (Signed)
FYI

## 2016-12-01 NOTE — Telephone Encounter (Signed)
Noted and thus no reason to change

## 2016-12-03 ENCOUNTER — Encounter: Payer: Self-pay | Admitting: Internal Medicine

## 2016-12-06 NOTE — Telephone Encounter (Signed)
Do not really thing thyroid is causing the problems  However  We could try  Higher dose of thyroid med and repeat  tsh in 2-3 months     If too much will have to adjust back down .   Avoid simple carbs sugars  Sweets  breads rice  Processed carbs      And  Increase exercise  Look at [employer] eating  Diet

## 2016-12-24 ENCOUNTER — Other Ambulatory Visit: Payer: Self-pay | Admitting: Internal Medicine

## 2016-12-27 ENCOUNTER — Telehealth: Payer: Self-pay | Admitting: Internal Medicine

## 2016-12-27 NOTE — Telephone Encounter (Signed)
Pt calling stating that she has received a letter stating that her valsartan has been recalled and would like to see what [name] want to do.

## 2016-12-27 NOTE — Telephone Encounter (Signed)
Patient states that she was notified by her pharmacy that her Valsartan is part of the recall. She would like to have a different medication sent in. She states that she previously was on Losartan and did not tolerate it well.  [name] - Please advise in Dr. Velora Mediate absence. Thanks!

## 2016-12-28 ENCOUNTER — Other Ambulatory Visit: Payer: Self-pay | Admitting: Emergency Medicine

## 2016-12-28 MED ORDER — IRBESARTAN 300 MG PO TABS: 300.0000 mg | ORAL_TABLET | Freq: Every day | ORAL | 0 refills | Status: DC

## 2016-12-28 NOTE — Telephone Encounter (Signed)
Spoke with patient regarding medication switch. Patient agreed to start Irbesartan 300 mg tablet. Medication has been sent to the pharmacy.

## 2016-12-28 NOTE — Telephone Encounter (Signed)
Switch to Irbesartan 300 mg daily. Call in #[phone] with no rf

## 2017-01-17 ENCOUNTER — Other Ambulatory Visit: Payer: Self-pay | Admitting: Family Medicine

## 2017-01-18 NOTE — Telephone Encounter (Signed)
Pt's valsartan has been recalled and in Dr Regis Bill absence Dr Sarajane Jews gave a 30 day refill for irbesartan (AVAPRO) 300 MG tablet  Pt states this med is doing well for her, but she has no refills and is going out of town on Friday.   [place], [place]

## 2017-01-18 NOTE — Telephone Encounter (Signed)
Rx sent Nothing further needed.  

## 2017-01-20 ENCOUNTER — Other Ambulatory Visit: Payer: Self-pay

## 2017-01-20 MED ORDER — TOLTERODINE TARTRATE ER 2 MG PO CP24: 2.0000 mg | ORAL_CAPSULE | Freq: Every day | ORAL | 3 refills | Status: DC

## 2017-01-20 MED ORDER — MONTELUKAST SODIUM 10 MG PO TABS: 10.0000 mg | ORAL_TABLET | Freq: Every day | ORAL | 3 refills | Status: DC

## 2017-01-24 ENCOUNTER — Telehealth: Payer: Self-pay | Admitting: Emergency Medicine

## 2017-01-24 NOTE — Telephone Encounter (Signed)
Left a VM for patient regarding starting Synthroid medication?

## 2017-01-25 ENCOUNTER — Other Ambulatory Visit: Payer: Self-pay | Admitting: Internal Medicine

## 2017-01-31 NOTE — Telephone Encounter (Signed)
Pt is returning Marie Kelly call °

## 2017-02-01 ENCOUNTER — Telehealth: Payer: Self-pay | Admitting: *Deleted

## 2017-02-01 NOTE — Telephone Encounter (Signed)
Spoke with patient regarding Thyroid medication and explained I needed clarification on which pharmacy she was using. [facility]. Nothing further needed

## 2017-02-01 NOTE — Telephone Encounter (Signed)
Patient came in today with her husband and stated Torrie called her and she is not sure why.  I looked at the chart and there is a note from 9/10 regarding starting thyroid medication.  I asked the pt about this and she stated she has been taking the same dose of thyroid medication for years and does not know why she would get a call for this? Patient stated her blood pressure medication was recalled.  Message sent to Torrie to call the pt.

## 2017-02-01 NOTE — Telephone Encounter (Signed)
Spoke with patient regarding thyroid medication and verified pharmacy. Patient states that since she has started the new blood pressure medication that her blood pressure has been a little more elevated than normal. 01/31/2017 blood pressure was 160/70, 145/70. Patient wants to continue blood pressure medication for a few weeks to see if her pressure comes down. Advise patient to record readings and to send in readings within the next two weeks through my chart. Patient agreed.

## 2017-02-04 ENCOUNTER — Encounter: Payer: Self-pay | Admitting: Internal Medicine

## 2017-02-23 ENCOUNTER — Telehealth: Payer: Self-pay | Admitting: Internal Medicine

## 2017-02-23 NOTE — Telephone Encounter (Signed)
° ° ° °  Pt said her bp has been all over the place and would like a call back

## 2017-02-24 NOTE — Telephone Encounter (Signed)
Spoke with patient, BPs are ranging from 116/50 - 164/69. Lowest has been 109/58. Pt denies headaches or dizziness. Pt states that there seems to be no rhyme or reason as to why her readings are so random and all over the place. Pt has checked BP machine for accuracy and it matches another doctor office BP cuff and calibrates. Pt advised that she may need an appt but that I will call her back with rec's  Please advise Dr Regis Bill, thanks.

## 2017-02-25 NOTE — Telephone Encounter (Signed)
Please update medicine list. And discontinue the valsartan.  Please review that she is taking her blood pressure readings under appropriate circumstances Sitting for 3-5 minutes legs uncrossed  Relaxed   take first reading discard  Take a second and/or third reading and use that as an average. Do 1 in the morning and 1 in the evening for  5 days. Record this and may be come back in to see me as an office visit with her readings.  Thanks

## 2017-03-01 NOTE — Telephone Encounter (Signed)
Pt aware of recommendations per Dr Regis Bill, Pt has not been taking her readings as directed below. Pt will start today/tomorrow recording readings as requested and will call to schedule an appt once she is close to having days recorded. Nothing further needed.

## 2017-03-08 ENCOUNTER — Ambulatory Visit (INDEPENDENT_AMBULATORY_CARE_PROVIDER_SITE_OTHER): Payer: Medicare Other | Admitting: Internal Medicine

## 2017-03-08 ENCOUNTER — Encounter: Payer: Self-pay | Admitting: Internal Medicine

## 2017-03-08 VITALS — BP 102/64 | HR 60 | Temp 98.3°F | Wt 193.8 lb

## 2017-03-08 DIAGNOSIS — Z79899 Other long term (current) drug therapy: Secondary | ICD-10-CM

## 2017-03-08 DIAGNOSIS — I10 Essential (primary) hypertension: Secondary | ICD-10-CM

## 2017-03-08 NOTE — Progress Notes (Signed)
Chief Complaint  Patient presents with  . Hypertension    Discuss BP readings    HPI: Marie Kelly 71 y.o. come in  With SDA   For  FU  bp issues On carvedilol and   Valsartan   has a log of blood pressure readings as instructed twice a day.  Most of the readings have been in the range some 06/06/1928 in the last couple days has been in the 150 occasional higher range.  Pulse is in the 50-65 range. She feels fine except for the stress in her family. Husband encouraged her to come in for a visit because of her readings.  He has had heart surgery. She does have a history of bigeminy but has no symptoms of syncope chest pain shortness of breath. Going on vacation to [place] for a couple weeks and will be back at the end of November.  ROS: See pertinent positives and negatives per HPI.  Past Medical History:  Diagnosis Date  . Anemia   . Asthma   . 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 Colon polyps   . Complication of anesthesia    2 days after surgery-muscles hurtand spasmed-needed a muscle relaxant   . Diverticulosis   . Fracture of right olecranon process 10/26/2013  . GERD (gastroesophageal reflux disease)    had endo? taking nexium 3 x per week  . Hyperlipidemia    had been on lipitor changes to pravachol cause of cost and tricor has since stopped  NO  se reported   . Hypothyroidism     on meds for years  . Impaired fasting glucose   . Olecranon fracture 10/25/2013   right arm  . Ventricular bigeminy seen on cardiac monitor    Cardiac work up Dr Stanford Breed [facility]  . Wears dentures    top-partial bottom  . Wears glasses     Family History  Problem Relation Age of Onset  . Stroke Mother   . Diabetes Father   . Heart disease Father   . Alzheimer's disease Unknown   . Stomach cancer Maternal Aunt   . Colon cancer Neg Hx   . Pancreatic cancer Neg Hx     Social History   Social History  . Marital status: Married      Spouse name: N/A  . Number of children: 3  . Years of education: N/A   Social History Main Topics  . Smoking status: Former Smoker    Quit date: 10/25/1973  . Smokeless tobacco: Never Used  . Alcohol use 0.0 oz/week     Comment: socially  . Drug use: No  . Sexual activity: Not Asked   Other Topics Concern  . 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    Gravida 3 para 3   Last td 6 2008   HS educated       REmote hx of tobacco 1.5 ppd for 6 years as a teen Q 62    Outpatient Medications Prior to Visit  Medication Sig Dispense Refill  . carvedilol (COREG) 6.25 MG tablet TAKE ONE TABLET BY MOUTH TWICE A DAY WITH A MEAL 60 tablet 3  . irbesartan (AVAPRO) 300 MG tablet  TAKE ONE TABLET BY MOUTH DAILY 90 tablet 3  . levothyroxine (SYNTHROID, LEVOTHROID) 50 MCG tablet TAKE 1 TABLET DAILY 90 tablet 1  . montelukast (SINGULAIR) 10 MG tablet Take 1 tablet (10 mg total) by mouth at bedtime. 90 tablet 3  . tolterodine (DETROL LA) 2 MG 24 hr capsule Take 1 capsule (2 mg total) by mouth daily. 90 capsule 3   No facility-administered medications prior to visit.      EXAM:  BP 102/64 (BP Location: Right Arm, Patient Position: Sitting, Cuff Size: Normal)   Pulse 60   Temp 98.3 F (36.8 C) (Oral)   Wt 193 lb 12.8 oz (87.9 kg)   BMI 34.33 kg/m   Body mass index is 34.33 kg/m. Repeat blood pressure 140/60 right arm 142/60 left arm pulse 54 no bigeminy today.  Heart no g or murmur  GENERAL: vitals reviewed and listed above, alert, oriented, appears well hydrated and in no acute distress HEENT: atraumatic, conjunctiva  clear, no obvious abnormalities on inspection of external nose and ears MS: moves all extremities without noticeable focal  abnormality PSYCH: pleasant and cooperative, no obvious depression or anxiety Lab  Results  Component Value Date   WBC 4.5 11/30/2016   HGB 12.9 11/30/2016   HCT 38.2 11/30/2016   PLT 253.0 11/30/2016   GLUCOSE 113 (H) 11/30/2016   CHOL 206 (H) 11/30/2016   TRIG 111.0 11/30/2016   HDL 48.70 11/30/2016   LDLDIRECT 168.5 04/23/2013   LDLCALC 135 (H) 11/30/2016   ALT 26 11/30/2016   AST 23 11/30/2016   NA 135 11/30/2016   K 4.7 11/30/2016   CL 101 11/30/2016   CREATININE 0.77 11/30/2016   BUN 9 11/30/2016   CO2 29 11/30/2016   TSH 3.76 11/30/2016   HGBA1C 5.5 11/30/2016   BP Readings from Last 3 Encounters:  03/08/17 102/64  11/30/16 130/70  11/22/16 130/80   Wt Readings from Last 3 Encounters:  03/08/17 193 lb 12.8 oz (87.9 kg)  11/30/16 189 lb 9.6 oz (86 kg)  11/22/16 195 lb 6.4 oz (88.6 kg)     ASSESSMENT AND PLAN:  Discussed the following assessment and plan:  Essential hypertension - see text  Medication management Blood pressure readings are up and down would not intensify treatment before she goes out of town. Weight that she gets back and take 3-5 days of reading send him by my chart. Next step would be to increase the carvedilol but I would be concerned about bradycardia.  We will have her take the irbesartan at night.  See if it makes a difference in her control. Consideration of changing to [facility]. -Patient advised to return or notify health care team  if  new concerns arise.  Patient Instructions  At this time same medication but take the  irbesartan at night     After  You get back from vacation  Take 3-5 days of readings  And send in  Via my chart  .   Want to avoid  Too low BP  And side effects of    Fainting etc.         Standley Brooking. Esti Demello M.D.

## 2017-03-08 NOTE — Patient Instructions (Signed)
At this time same medication but take the  irbesartan at night     After  You get back from vacation  Take 3-5 days of readings  And send in  Via my chart  .   Want to avoid  Too low BP  And side effects of    Fainting etc.

## 2017-04-15 DIAGNOSIS — H47323 Drusen of optic disc, bilateral: Secondary | ICD-10-CM | POA: Diagnosis not present

## 2017-04-15 DIAGNOSIS — H5340 Unspecified visual field defects: Secondary | ICD-10-CM | POA: Diagnosis not present

## 2017-04-15 DIAGNOSIS — H401213 Low-tension glaucoma, right eye, severe stage: Secondary | ICD-10-CM | POA: Diagnosis not present

## 2017-04-15 NOTE — Progress Notes (Signed)
[facility] Report   Patient Details  Name: Marie Kelly MRN: [id number] Date of Birth: 13-Apr-1946 Age: 71 y.o. PCP: Burnis Medin, MD  Vitals:   04/11/17 1313  BP: (!) 168/80  Pulse: 62  Resp: 18  Weight: 200 lb 12.8 oz (91.1 kg)     [facility] Eval - 04/15/17 1300      Referral    Referring Provider  self/panosh    Reason for referral  Orthopedic;Obesitity/Overweight;Inactivity;Hypertension    Program Start Date  04/11/17      Measurement   Waist Circumference  42.5 inches    Hip Circumference  44.5 inches    Body fat  44.8 percent      Information for Trainer   Goals  "lose weight, feel better, build endurance, gain energy"    Current Exercise  "some walking"    Orthopedic Concerns  RT knee arthritis, has to stop often    Pertinent Medical History  HTN, sinus congestion    Current Barriers  RT knee      Timed Up and Go (TUGS)   Timed Up and Go  Low risk <9 seconds 8.34sec   8.34sec     Mobility and Daily Activities   I find it easy to walk up or down two or more flights of stairs.  2    I have no trouble taking out the trash.  4    I do housework such as vacuuming and dusting on my own without difficulty.  4    I can easily lift a gallon of milk (8lbs).  4    I can easily walk a mile.  2    I have no trouble reaching into high cupboards or reaching down to pick up something from the floor.  3    I do not have trouble doing out-door work such as Armed forces logistics/support/administrative officer, raking leaves, or gardening.  4      Mobility and Daily Activities   I feel younger than my age.  2    I feel independent.  4    I feel energetic.  2    I live an active life.   2    I feel strong.  1    I feel healthy.  2    I feel active as other people my age.  1      How fit and strong are you.   Fit and Strong Total Score  37      Past Medical History:  Diagnosis Date  . Anemia   . Asthma   . Closed disp fx of right olecranon with intraartic exten w nonunion 04/19/2014   . Colon polyps   . Complication of anesthesia    2 days after surgery-muscles hurtand spasmed-needed a muscle relaxant   . Diverticulosis   . Fracture of right olecranon process 10/26/2013  . GERD (gastroesophageal reflux disease)    had endo? taking nexium 3 x per week  . Hyperlipidemia    had been on lipitor changes to pravachol cause of cost and tricor has since stopped  NO  se reported   . Hypothyroidism     on meds for years  . Impaired fasting glucose   . Olecranon fracture 10/25/2013   right arm  . Ventricular bigeminy seen on cardiac monitor    Cardiac work up Dr Stanford Breed [facility]  . Wears dentures    top-partial bottom  . Wears glasses    Past  Surgical History:  Procedure Laterality Date  . BELPHAROPTOSIS REPAIR Bilateral   . CHOLECYSTECTOMY    . COLONOSCOPY    . HARVEST BONE GRAFT Right 04/19/2014   Procedure: HARVEST LEFT TIBIA BONE GRAFT ;  Surgeon: Johnny Bridge, MD;  Location: [place];  Service: Orthopedics;  Laterality: Right;  . HEMORROIDECTOMY    . ORIF ELBOW FRACTURE Right 10/26/2013   Procedure: RIGHT ELBOW FRACTUE OPEN TREATMENT ULNAR PROXIMAL END OLECRANON PROCESS INCLUDES INTERNAL FIXATION;  Surgeon: Johnny Bridge, MD;  Location: [place];  Service: Orthopedics;  Laterality: Right;  . ORIF ELBOW FRACTURE Right 04/19/2014   Procedure: OPEN REDUCTION INTERNAL FIXATION (ORIF) RIGHT ELBOW/OLECRANON NONUNION FRACTURE WITH HARDWARE REMOVAL;  Surgeon: Johnny Bridge, MD;  Location: [place];  Service: Orthopedics;  Laterality: Right;  . TONSILLECTOMY    . TUBAL LIGATION    . uterus repair     Social History   Tobacco Use  Smoking Status Former Smoker  . Last attempt to quit: 10/25/1973  . 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   Marie Kelly 04/15/2017, 1:18 PM

## 2017-04-21 DIAGNOSIS — H35373 Puckering of macula, bilateral: Secondary | ICD-10-CM | POA: Diagnosis not present

## 2017-04-21 DIAGNOSIS — H43813 Vitreous degeneration, bilateral: Secondary | ICD-10-CM | POA: Diagnosis not present

## 2017-04-22 NOTE — Progress Notes (Signed)
[facility] PREP Weekly Session   Patient Details  Name: Marie Kelly MRN: [id number] Date of Birth: 11-12-1945 Age: 71 y.o. PCP: Burnis Medin, MD  Vitals:   04/20/17 1232  Weight: 196 lb 12.8 oz (89.3 kg)    [facility] Weekly seesion - 04/22/17 1200      Weekly Session   Topic Discussed  Hitting roadblocks    Minutes exercised this week  -- chair yoga class   chair yoga class   Classes attended to date  1      Fun things you did since last meeting:"went and marched in parade" Things you are grateful for:"grandkids" Nutrition celebrations:"just started" Barriers:"staying on track"  [name] 04/22/2017, 12:32 PM

## 2017-05-04 DIAGNOSIS — M1711 Unilateral primary osteoarthritis, right knee: Secondary | ICD-10-CM | POA: Diagnosis not present

## 2017-05-13 DIAGNOSIS — H401213 Low-tension glaucoma, right eye, severe stage: Secondary | ICD-10-CM | POA: Diagnosis not present

## 2017-05-13 DIAGNOSIS — Z961 Presence of intraocular lens: Secondary | ICD-10-CM | POA: Diagnosis not present

## 2017-05-13 DIAGNOSIS — H04123 Dry eye syndrome of bilateral lacrimal glands: Secondary | ICD-10-CM | POA: Diagnosis not present

## 2017-05-13 DIAGNOSIS — H401222 Low-tension glaucoma, left eye, moderate stage: Secondary | ICD-10-CM | POA: Diagnosis not present

## 2017-05-20 NOTE — Progress Notes (Signed)
[facility] PREP Weekly Session   Patient Details  Name: Marie Kelly MRN: [id number] Date of Birth: 1946/02/20 Age: 72 y.o. PCP: Burnis Medin, MD  Vitals:   05/18/17 1015  Weight: 203 lb (92.1 kg)    [facility] Weekly seesion - 05/20/17 1000      Weekly Session   Topic Discussed  Restaurant Eating    Classes attended to date  2      Things you are grateful for:"New Year" Nutrition celebrations for the week:"Not good" Barriers:"Bill"  [name] 05/20/2017, 10:16 AM

## 2017-05-30 NOTE — Progress Notes (Signed)
[facility] PREP Weekly Session   Patient Details  Name: Zani Kyllonen MRN: [id number] Date of Birth: 08/11/1945 Age: 72 y.o. PCP: Burnis Medin, MD  Vitals:   05/30/17 0944  Weight: 199 lb 12.8 oz (90.6 kg)    [facility] Weekly seesion - 05/30/17 0900      Weekly Session   Topic Discussed  Stress management and problem solving    Minutes exercised this week  -- First meeting w/ trainer and did strength   First meeting w/ trainer and did strength   Classes attended to date  3      Fun things you did since last meeting:"joined 40day sugar fast" Things you are grateful for"family" Nutrition celebrations:"stayed away from sugar"   [name] 05/30/2017, 9:46 AM

## 2017-05-30 NOTE — Progress Notes (Deleted)
No chief complaint on file.   HPI: Marie Kelly 72 y.o. come in for    Pre op eva;luation and "clearance "  For  tka to be done  ROS: See pertinent positives and negatives per HPI.  Past Medical History:  Diagnosis Date  . Anemia   . Asthma   . 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 Colon polyps   . Complication of anesthesia    2 days after surgery-muscles hurtand spasmed-needed a muscle relaxant   . Diverticulosis   . Fracture of right olecranon process 10/26/2013  . GERD (gastroesophageal reflux disease)    had endo? taking nexium 3 x per week  . Hyperlipidemia    had been on lipitor changes to pravachol cause of cost and tricor has since stopped  NO  se reported   . Hypothyroidism     on meds for years  . Impaired fasting glucose   . Olecranon fracture 10/25/2013   right arm  . Ventricular bigeminy seen on cardiac monitor    Cardiac work up Dr Stanford Breed [facility]  . Wears dentures    top-partial bottom  . Wears glasses     Family History  Problem Relation Age of Onset  . Stroke Mother   . Diabetes Father   . Heart disease Father   . Alzheimer's disease Unknown   . Stomach cancer Maternal Aunt   . Colon cancer Neg Hx   . Pancreatic cancer Neg Hx     Social History   Socioeconomic History  . Marital status: Married    Spouse name: Not on file  . Number of children: 3  . Years of education: Not on file  . Highest education level: Not on file  Social Needs  . Financial resource strain: Not on file  . Food insecurity - worry: Not on file  . Food insecurity - inability: Not on file  . Transportation needs - medical: Not on file  . Transportation needs - non-medical: Not on file  Occupational History  . Not on file  Tobacco Use  . Smoking status: Former Smoker    Last attempt to quit: 10/25/1973    Years since quitting: 43.6  . Smokeless tobacco: Never Used  Substance and Sexual Activity  . Alcohol use: Yes    Alcohol/week: 0.0 oz    Comment: socially  . Drug use: No  . Sexual activity: Not on file  Other Topics Concern  . 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    Gravida 3 para 3   Last td 6 2008   HS educated       REmote hx of tobacco 1.5 ppd for 6 years as a teen Q 11    Outpatient Medications Prior to Visit  Medication Sig Dispense Refill  . carvedilol (COREG) 6.25 MG tablet TAKE ONE TABLET BY MOUTH TWICE A DAY WITH A MEAL 60 tablet 3  . irbesartan (AVAPRO) 300 MG tablet TAKE ONE TABLET BY MOUTH DAILY 90 tablet 3  . latanoprost (XALATAN) 0.005 % ophthalmic solution Place 1 drop into both eyes at bedtime.    Marland Kitchen levothyroxine (SYNTHROID, LEVOTHROID) 50 MCG tablet TAKE 1 TABLET DAILY 90 tablet 1  .  montelukast (SINGULAIR) 10 MG tablet Take 1 tablet (10 mg total) by mouth at bedtime. 90 tablet 3  . tolterodine (DETROL LA) 2 MG 24 hr capsule Take 1 capsule (2 mg total) by mouth daily. 90 capsule 3   No facility-administered medications prior to visit.      EXAM:  There were no vitals taken for this visit.  There is no height or weight on file to calculate BMI.  GENERAL: vitals reviewed and listed above, alert, oriented, appears well hydrated and in no acute distress HEENT: atraumatic, conjunctiva  clear, no obvious abnormalities on inspection of external nose and ears OP : no lesion edema or exudate  NECK: no obvious masses on inspection palpation  LUNGS: clear to auscultation bilaterally, no wheezes, rales or rhonchi, good air movement CV: HRRR, no clubbing cyanosis or  peripheral edema nl cap refill  MS: moves all extremities without noticeable focal  abnormality PSYCH: pleasant and cooperative, no obvious depression or anxiety Lab Results  Component Value Date   WBC 4.5 11/30/2016   HGB 12.9 11/30/2016   HCT  38.2 11/30/2016   PLT 253.0 11/30/2016   GLUCOSE 113 (H) 11/30/2016   CHOL 206 (H) 11/30/2016   TRIG 111.0 11/30/2016   HDL 48.70 11/30/2016   LDLDIRECT 168.5 04/23/2013   LDLCALC 135 (H) 11/30/2016   ALT 26 11/30/2016   AST 23 11/30/2016   NA 135 11/30/2016   K 4.7 11/30/2016   CL 101 11/30/2016   CREATININE 0.77 11/30/2016   BUN 9 11/30/2016   CO2 29 11/30/2016   TSH 3.76 11/30/2016   HGBA1C 5.5 11/30/2016   BP Readings from Last 3 Encounters:  04/11/17 (!) 168/80  03/08/17 102/64  11/30/16 130/70    ASSESSMENT AND PLAN:  Discussed the following assessment and plan:  Pre-operative clearance  Essential hypertension  Medication management  Arthritis of right knee  -Patient advised to return or notify health care team  if  new concerns arise.  There are no Patient Instructions on file for this visit.   Standley Brooking. Panosh M.D.

## 2017-05-31 ENCOUNTER — Ambulatory Visit: Payer: Medicare Other | Admitting: Internal Medicine

## 2017-06-01 NOTE — Progress Notes (Signed)
Chief Complaint  Patient presents with  . Follow-up    Surgical clearance for knee replacement 06/21/17. Pt also c/o cough and congesion x 2 weeks. Dry ccugh, some ear pain. Cough not relieved with Advair    HPI: Marie Kelly 72 y.o. come in for pre op eval for partial KA   Right for  February 5 19.   r knee becoming problematic    Pain better on mobic      BP   @  Dr ortho is was ok  In the 120 range  No change bp med otherwise bp readings about 140 range    Having 2 weeks of ur congestion and dry cough  No fever  Phlegm  Hx of asthma allergy on singular but not taking advair for a while and has expired 2014 Advair took yesterday   Has been using "prn" no recent flares.    ROS: See pertinent positives and negatives per HPI. No cp neuro problems  Past Medical History:  Diagnosis Date  . Anemia   . Asthma   . 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 Colon polyps   . Complication of anesthesia    2 days after surgery-muscles hurtand spasmed-needed a muscle relaxant   . Diverticulosis   . Fracture of right olecranon process 10/26/2013  . GERD (gastroesophageal reflux disease)    had endo? taking nexium 3 x per week  . Hyperlipidemia    had been on lipitor changes to pravachol cause of cost and tricor has since stopped  NO  se reported   . Hypothyroidism     on meds for years  . Impaired fasting glucose   . Olecranon fracture 10/25/2013   right arm  . Ventricular bigeminy seen on cardiac monitor    Cardiac work up Dr Stanford Breed [facility]  . Wears dentures    top-partial bottom  . Wears glasses     Family History  Problem Relation Age of Onset  . Stroke Mother   . Diabetes Father   . Heart disease Father   . Alzheimer's disease Unknown   . Stomach cancer Maternal Aunt   . Colon cancer Neg Hx   . Pancreatic cancer Neg Hx     Social History   Socioeconomic History  . Marital status: Married    Spouse name: None  . Number of  children: 3  . Years of education: None  . Highest education level: None  Social Needs  . Financial resource strain: None  . Food insecurity - worry: None  . Food insecurity - inability: None  . Transportation needs - medical: None  . Transportation needs - non-medical: None  Occupational History  . None  Tobacco Use  . Smoking status: Former Smoker    Last attempt to quit: 10/25/1973    Years since quitting: 43.6  . Smokeless tobacco: Never Used  Substance and Sexual Activity  . Alcohol use: Yes    Alcohol/week: 0.0 oz    Comment: socially  . Drug use: No  . Sexual activity: None  Other Topics Concern  . 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    Gravida 3 para 3   Last td 6 2008  HS educated       REmote hx of tobacco 1.5 ppd for 6 years as a teen Q 39    Outpatient Medications Prior to Visit  Medication Sig Dispense Refill  . bimatoprost (LUMIGAN) 0.01 % SOLN Place 1 drop into both eyes at bedtime.    . carvedilol (COREG) 6.25 MG tablet TAKE ONE TABLET BY MOUTH TWICE A DAY WITH A MEAL 60 tablet 3  . irbesartan (AVAPRO) 300 MG tablet TAKE ONE TABLET BY MOUTH DAILY 90 tablet 3  . levothyroxine (SYNTHROID, LEVOTHROID) 50 MCG tablet TAKE 1 TABLET DAILY 90 tablet 1  . meloxicam (MOBIC) 15 MG tablet Take 15 mg by mouth daily as needed for pain.    . montelukast (SINGULAIR) 10 MG tablet Take 1 tablet (10 mg total) by mouth at bedtime. 90 tablet 3  . tolterodine (DETROL LA) 2 MG 24 hr capsule Take 1 capsule (2 mg total) by mouth daily. 90 capsule 3  . Fluticasone-Salmeterol (ADVAIR) 250-50 MCG/DOSE AEPB Inhale 1 puff into the lungs daily.     No facility-administered medications prior to visit.      EXAM:  BP (!) 142/82 (BP Location: Right Arm, Patient Position: Sitting, Cuff Size: Normal)   Pulse 62   Temp  98.5 F (36.9 C) (Oral)   Wt 198 lb 3.2 oz (89.9 kg)   BMI 35.11 kg/m   Body mass index is 35.11 kg/m.  GENERAL: vitals reviewed and listed above, alert, oriented, appears well hydrated and in no acute distress  No resp distress   HEENT: atraumatic, conjunctiva  clear, no obvious abnormalities on inspection of external nose and ears tms clear face non tender OP : no lesion edema or exudate  NECK: no obvious masses on inspection palpation  LUNGS: clear to auscultation bilaterally but exp wheezy cough no rales or rhonchi CV: HRRR, no clubbing cyanosis or  peripheral edema nl cap refill  Abdomen:  Sof,t normal bowel sounds without hepatosplenomegaly, no guarding rebound or masses no CVA tenderness MS: moves all extremities independent gait  PSYCH: pleasant and cooperative, no obvious depression or anxiety  BP Readings from Last 3 Encounters:  06/02/17 (!) 142/82  04/11/17 (!) 168/80  03/08/17 102/64   Wt Readings from Last 3 Encounters:  06/02/17 198 lb 3.2 oz (89.9 kg)  05/30/17 199 lb 12.8 oz (90.6 kg)  05/18/17 203 lb (92.1 kg)    ASSESSMENT AND PLAN:  Discussed the following assessment and plan:  Mild asthma with acute exacerbation, unspecified whether persistent  Essential hypertension  Medication management  Pre-operative clearance  Cough, persistent  Osteoarthritis of right knee, unspecified osteoarthritis type  Patient acceptable for knee surgery.  However not until her respiratory status resolves.  Suspect this is a mild asthmatic flare from a viral respiratory infection no evidence of bacterial infection today. Updated  prescriptions written to take Advair twice daily albuterol rescue can add prednisone if not responding. Follow-up with alarm symptoms expectant management. Discussed concerns about surgery should she discuss this with her surgeon risk of infection etc. Blood pressure is adequately controlled for surgery .  -Patient advised to return or notify  health care team  if  new concerns arise.  Patient Instructions    Your chest is clear but wheezy  I think this is  Mild asthmatic flare from a viral respiratory infection . No signs of pneumonia.   Take advair twice a day for now  And albuterol as needed.  I fnot improving can add 5 days  of oral prednisone.   When better    OK to proceed with elective surgery .      Standley Brooking. Panosh M.D.

## 2017-06-02 ENCOUNTER — Encounter: Payer: Self-pay | Admitting: Internal Medicine

## 2017-06-02 ENCOUNTER — Ambulatory Visit (INDEPENDENT_AMBULATORY_CARE_PROVIDER_SITE_OTHER): Payer: Medicare Other | Admitting: Internal Medicine

## 2017-06-02 VITALS — BP 142/82 | HR 62 | Temp 98.5°F | Wt 198.2 lb

## 2017-06-02 DIAGNOSIS — M1711 Unilateral primary osteoarthritis, right knee: Secondary | ICD-10-CM

## 2017-06-02 DIAGNOSIS — Z01818 Encounter for other preprocedural examination: Secondary | ICD-10-CM | POA: Diagnosis not present

## 2017-06-02 DIAGNOSIS — R05 Cough: Secondary | ICD-10-CM

## 2017-06-02 DIAGNOSIS — Z79899 Other long term (current) drug therapy: Secondary | ICD-10-CM | POA: Diagnosis not present

## 2017-06-02 DIAGNOSIS — I10 Essential (primary) hypertension: Secondary | ICD-10-CM

## 2017-06-02 DIAGNOSIS — J45901 Unspecified asthma with (acute) exacerbation: Secondary | ICD-10-CM | POA: Diagnosis not present

## 2017-06-02 DIAGNOSIS — R053 Chronic cough: Secondary | ICD-10-CM

## 2017-06-02 MED ORDER — FLUTICASONE-SALMETEROL 250-50 MCG/DOSE IN AEPB: 1.0000 | INHALATION_SPRAY | Freq: Two times a day (BID) | RESPIRATORY_TRACT | 6 refills | Status: DC

## 2017-06-02 MED ORDER — PREDNISONE 20 MG PO TABS: 20.0000 mg | ORAL_TABLET | Freq: Two times a day (BID) | ORAL | 0 refills | Status: DC

## 2017-06-02 MED ORDER — ALBUTEROL SULFATE HFA 108 (90 BASE) MCG/ACT IN AERS: 2.0000 | INHALATION_SPRAY | Freq: Four times a day (QID) | RESPIRATORY_TRACT | 1 refills | Status: DC | PRN

## 2017-06-02 NOTE — Patient Instructions (Signed)
Your chest is clear but wheezy  I think this is  Mild asthmatic flare from a viral respiratory infection . No signs of pneumonia.   Take advair twice a day for now  And albuterol as needed.  I fnot improving can add 5 days of oral prednisone.   When better    OK to proceed with elective surgery .

## 2017-06-06 ENCOUNTER — Other Ambulatory Visit: Payer: Self-pay | Admitting: Orthopedic Surgery

## 2017-06-06 DIAGNOSIS — H04123 Dry eye syndrome of bilateral lacrimal glands: Secondary | ICD-10-CM | POA: Diagnosis not present

## 2017-06-06 DIAGNOSIS — H5713 Ocular pain, bilateral: Secondary | ICD-10-CM | POA: Diagnosis not present

## 2017-06-07 ENCOUNTER — Ambulatory Visit: Payer: Self-pay | Admitting: *Deleted

## 2017-06-07 ENCOUNTER — Encounter (HOSPITAL_COMMUNITY): Payer: Self-pay

## 2017-06-07 NOTE — Telephone Encounter (Signed)
Patient is calling to inquire about some recent blood pressure elevations she has had. She was in the office and was treated for asthma with Advair, Albuterol and Prednisone. She has just finished the prednisone last night and she is still using the inhalation treatments. She wants her provider to know she has a pre-op tomorrow for her knee- but she may cancel it due to her asthma and BP elevations. Patient wants to know if elevated BP's are a side effect of her medications- because she has been getting some very high readings.1/18- 127/75,109/56                                                                                 1/21- am 180/78, 168/76                                 pm  201/75, 149/78, 142/68                        1/22-182/78,189/76 Patient can be reached at [phone] Reason for Disposition . Systolic BP  >= 573 OR Diastolic >= 220  Answer Assessment - Initial Assessment Questions 1. BLOOD PRESSURE: "What is the blood pressure?" "Did you take at least two measurements 5 minutes apart?"     188/78, 189/76 this morning 2. ONSET: "When did you take your blood pressure?"     This morning- 9:20 3. HOW: "How did you obtain the blood pressure?" (e.g., visiting nurse, automatic home BP monitor)     Automatic cuff 4. HISTORY: "Do you have a history of high blood pressure?"     yes 5. MEDICATIONS: "Are you taking any medications for blood pressure?" "Have you missed any doses recently?"     Carvedilol 6.25 mg 1am/1pm  Irbesartan 300mg  6. OTHER SYMPTOMS: "Do you have any symptoms?" (e.g., headache, chest pain, blurred vision, difficulty breathing, weakness)     Blurred vision- last week- dry eye- went to eye doctor, still having asthma problems 7. PREGNANCY: "Is there any chance you are pregnant?" "When was your last menstrual period?"     n/a  Protocols used: HIGH BLOOD PRESSURE-A-AH

## 2017-06-07 NOTE — Pre-Procedure Instructions (Signed)
Marie Kelly  06/07/2017      [place], [place] [address] Phone: [phone] Fax: [phone]    Your procedure is scheduled on Feb 5  Report to [facility] at 530 A.M.  Call this number if you have problems the morning of surgery:  [phone]   Remember:  Do not eat food or drink liquids after midnight.  Take these medicines the morning of surgery with A SIP OF WATER Albuterol inhaler if needed, Carvedilol (Coreg), Advair inhaler, levothyroxine (Synthroid), Prednisone (Deltasone) tolterodine (Detrol LA)  Bring your inhalers with you on the day of surgery.  Stop taking aspirin, BC's, Goody's, herbal medications, fish Oil, Ibuprofen, Advil, Motrin, Aleve, Vitamins, Mobic (Meloxicam)   Do not wear jewelry, make-up or nail polish.  Do not wear lotions, powders, or perfumes, or deodorant.  Do not shave 48 hours prior to surgery.  Men may shave face and neck.  Do not bring valuables to the hospital.  [facility] is not responsible for any belongings or valuables.  Contacts, dentures or bridgework may not be worn into surgery.  Leave your suitcase in the car.  After surgery it may be brought to your room.  For patients admitted to the hospital, discharge time will be determined by your treatment team.  Patients discharged the day of surgery will not be allowed to drive home.   Special instructions:  [name] - Preparing for Surgery  Before surgery, you can play an important role.  Because skin is not sterile, your skin needs to be as free of germs as possible.  You can reduce the number of germs on you skin by washing with CHG (chlorahexidine gluconate) soap before surgery.  CHG is an antiseptic cleaner which kills germs and bonds with the skin to continue killing germs even after washing.  Please DO NOT use if you have an allergy to CHG or antibacterial soaps.  If  your skin becomes reddened/irritated stop using the CHG and inform your nurse when you arrive at Short Stay.  Do not shave (including legs and underarms) for at least 48 hours prior to the first CHG shower.  You may shave your face.  Please follow these instructions carefully:   1.  Shower with CHG Soap the night before surgery and the  morning of Surgery.  2.  If you choose to wash your hair, wash your hair first as usual with your  normal shampoo.  3.  After you shampoo, rinse your hair and body thoroughly to remove the Shampoo.  4.  Use CHG as you would any other liquid soap.  You can apply chg directly  to the skin and wash gently with scrungie or a clean washcloth.  5.  Apply the CHG Soap to your body ONLY FROM THE NECK DOWN.  Do not use on open wounds or open sores.  Avoid contact with your eyes,  ears, mouth and genitals (private parts).  Wash genitals (private parts) with your normal soap.  6.  Wash thoroughly, paying special attention to the area where your surgery will be performed.  7.  Thoroughly rinse your body with warm water from the neck down.  8.  DO NOT shower/wash with your normal soap after using and rinsing off   the CHG Soap.  9.  Pat yourself dry with a clean towel.  10.  Wear clean pajamas.            11.  Place clean sheets on your bed the night of your first shower and do not  sleep with pets.  Day of Surgery  Do not apply any lotions/deoderants the morning of surgery.  Please wear clean clothes to the hospital/surgery center.    Please read over the following fact sheets that you were given. Pain Booklet, Coughing and Deep Breathing, MRSA Information and Surgical Site Infection Prevention

## 2017-06-07 NOTE — Telephone Encounter (Signed)
Blood pressure not directly affected by prednisone however could affected slightly. It is possible her readings are up because of her asthma.  Although they are very high. For that also   Asthma should be better before any  Elective surgery    And anesthesia will  Probably tell you this   Let us know  bp readings after  Breathing improves   If not getting better  Can do OV to reevaluate   Get chest x ray etc

## 2017-06-08 ENCOUNTER — Encounter (HOSPITAL_COMMUNITY): Payer: Self-pay | Admitting: Emergency Medicine

## 2017-06-08 ENCOUNTER — Encounter (HOSPITAL_COMMUNITY)
Admission: RE | Admit: 2017-06-08 | Discharge: 2017-06-08 | Disposition: A | Discharge status: Home / Self Care | Payer: Medicare Other | Source: Ambulatory Visit | Attending: Orthopedic Surgery | Admitting: Orthopedic Surgery

## 2017-06-08 ENCOUNTER — Other Ambulatory Visit: Payer: Self-pay

## 2017-06-08 ENCOUNTER — Ambulatory Visit: Payer: Self-pay | Admitting: *Deleted

## 2017-06-08 ENCOUNTER — Encounter (HOSPITAL_COMMUNITY): Payer: Self-pay

## 2017-06-08 DIAGNOSIS — Z01812 Encounter for preprocedural laboratory examination: Secondary | ICD-10-CM | POA: Diagnosis present

## 2017-06-08 DIAGNOSIS — Z01810 Encounter for preprocedural cardiovascular examination: Secondary | ICD-10-CM | POA: Insufficient documentation

## 2017-06-08 HISTORY — DX: Unspecified osteoarthritis, unspecified site: M19.90

## 2017-06-08 HISTORY — DX: Other specified postprocedural states: Z98.890

## 2017-06-08 HISTORY — DX: Nausea with vomiting, unspecified: R11.2

## 2017-06-08 HISTORY — DX: Essential (primary) hypertension: I10

## 2017-06-08 LAB — CBC
HCT: 39.4 % (ref 36.0–46.0)
Hemoglobin: 13 g/dL (ref 12.0–15.0)
MCH: 28.9 pg (ref 26.0–34.0)
MCHC: 33 g/dL (ref 30.0–36.0)
MCV: 87.6 fL (ref 78.0–100.0)
Platelets: 274 10*3/uL (ref 150–400)
RBC: 4.5 MIL/uL (ref 3.87–5.11)
RDW: 13.5 % (ref 11.5–15.5)
WBC: 8.2 10*3/uL (ref 4.0–10.5)

## 2017-06-08 LAB — BASIC METABOLIC PANEL
ANION GAP: 10 (ref 5–15)
BUN: 20 mg/dL (ref 6–20)
CALCIUM: 9.1 mg/dL (ref 8.9–10.3)
CO2: 23 mmol/L (ref 22–32)
CREATININE: 0.82 mg/dL (ref 0.44–1.00)
Chloride: 105 mmol/L (ref 101–111)
GLUCOSE: 93 mg/dL (ref 65–99)
Potassium: 4.4 mmol/L (ref 3.5–5.1)
Sodium: 138 mmol/L (ref 135–145)

## 2017-06-08 LAB — SURGICAL PCR SCREEN
MRSA, PCR: NEGATIVE
Staphylococcus aureus: NEGATIVE

## 2017-06-08 NOTE — Progress Notes (Signed)
PCP: Dr. Ples Specter. has appointment 06-09-17 for follow up concerning elevated blood pressure and asthma.  Pt. Reported she had an asthma attack 3 weeks ago. Saw [name] 06-02-17 and started predisone, advair, and rescue inhaler. Pt. Report feeling better,although still congested but no coughing. Pt. Concerned of blood pressure reading taken at home of elevated reading. Notified Albin Felling.,PA--will forward chart to her.

## 2017-06-08 NOTE — Telephone Encounter (Signed)
Pt called because her b/p is still elevated at 170/64. She is having surgery on Feb 5th and she is concerned that she may have to cancel it. Appointment made to reevaluate her b/p.

## 2017-06-08 NOTE — Progress Notes (Signed)
No chief complaint on file.   HPI:  Marie Kelly 72 y.o. come in for ongoin  resp sx and    bp being up  See last notes and  Messaging.   bp was normal at preop but getting high in am 180 and higher ranges  Taking bid coreg and  ibesartan changed cause of valsartan recall   180  In am  And   At night 140.range   Bp monitor has been checked   Does have bigeminy   Congested and  Sore throat  No coughing at this time. Albuterol  advair  At  Now.  Has finished the prednisone  No fever severe face paibn over all getting better but has st .  And no nocturnal cough  ROS: See pertinent positives and negatives per HPI. No cp sob  To have feb tka  Past Medical History:  Diagnosis Date  . Anemia    pt. denies  . Arthritis   . Asthma   . 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 Colon polyps   . Complication of anesthesia    2 days after surgery-muscles hurtand spasmed-needed a muscle relaxant   . Diverticulosis   . Fracture of right olecranon process 10/26/2013  . GERD (gastroesophageal reflux disease)    had endo? taking nexium 3 x per week  . Hyperlipidemia    had been on lipitor changes to pravachol cause of cost and tricor has since stopped  NO  se reported   . Hypertension   . Hypothyroidism     on meds for years  . Impaired fasting glucose   . Olecranon fracture 10/25/2013   right arm  . PONV (postoperative nausea and vomiting)   . Ventricular bigeminy seen on cardiac monitor    Cardiac work up Dr Stanford Breed [facility]  . Wears dentures    top-partial bottom  . Wears glasses     Family History  Problem Relation Age of Onset  . Stroke Mother   . Diabetes Father   . Heart disease Father   . Alzheimer's disease Unknown   . Stomach cancer Maternal Aunt   . Colon cancer Neg Hx   . Pancreatic cancer Neg Hx     Social History   Socioeconomic History  . Marital status: Married    Spouse name: None  . Number of children: 3  . Years  of education: None  . Highest education level: None  Social Needs  . Financial resource strain: None  . Food insecurity - worry: None  . Food insecurity - inability: None  . Transportation needs - medical: None  . Transportation needs - non-medical: None  Occupational History  . None  Tobacco Use  . Smoking status: Former Smoker    Last attempt to quit: 10/25/1973    Years since quitting: 43.6  . Smokeless tobacco: Never Used  Substance and Sexual Activity  . Alcohol use: Yes    Alcohol/week: 0.0 oz    Comment: socially  . Drug use: No  . Sexual activity: None  Other Topics Concern  . 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    Gravida 3 para 3   Last td 6 2008  HS educated       REmote hx of tobacco 1.5 ppd for 6 years as a teen Q 60    Outpatient Medications Prior to Visit  Medication Sig Dispense Refill  . albuterol (PROVENTIL HFA;VENTOLIN HFA) 108 (90 Base) MCG/ACT inhaler Inhale 2 puffs into the lungs every 6 (six) hours as needed for wheezing or shortness of breath. 1 Inhaler 1  . bimatoprost (LUMIGAN) 0.01 % SOLN Place 1 drop into both eyes at bedtime.    . Fluticasone-Salmeterol (ADVAIR) 250-50 MCG/DOSE AEPB Inhale 1 puff into the lungs 2 (two) times daily. 1 each 6  . irbesartan (AVAPRO) 300 MG tablet TAKE ONE TABLET BY MOUTH DAILY 90 tablet 3  . levothyroxine (SYNTHROID, LEVOTHROID) 50 MCG tablet TAKE 1 TABLET DAILY 90 tablet 1  . meloxicam (MOBIC) 15 MG tablet Take 15 mg by mouth daily as needed for pain.    . montelukast (SINGULAIR) 10 MG tablet Take 1 tablet (10 mg total) by mouth at bedtime. 90 tablet 3  . tolterodine (DETROL LA) 2 MG 24 hr capsule Take 1 capsule (2 mg total) by mouth daily. 90 capsule 3  . carvedilol (COREG) 6.25 MG tablet TAKE ONE TABLET BY MOUTH TWICE A DAY WITH A MEAL 60 tablet 3    . predniSONE (DELTASONE) 20 MG tablet Take 1 tablet (20 mg total) by mouth 2 (two) times daily with a meal. If needed for asthma flare 10 tablet 0   No facility-administered medications prior to visit.      EXAM:  BP 136/78 (BP Location: Left Arm, Patient Position: Sitting, Cuff Size: Normal)   Pulse 68   Temp 98.1 F (36.7 C) (Oral)   Wt 201 lb (91.2 kg)   SpO2 98%   BMI 34.50 kg/m   Body mass index is 34.5 kg/m.  GENERAL: vitals reviewed and listed above, alert, oriented, appears well hydrated and in no acute distress not coughing  No aggravated congestion  HEENT: atraumatic, conjunctiva  clear, no obvious abnormalities on inspection of external nose and ears OP : no lesion edema or exudate  Mild erythema no thrush seen NECK: no obvious masses on inspection palpation  Tender ac area no masses  LUNGS: clear to auscultation bilaterally, no wheezes, rales or rhonchi, good air movement CV: HRRR, no clubbing cyanosis or  peripheral edema nl cap refill  MS: moves all extremities without noticeable focal  abnormality PSYCH: pleasant and cooperative, no obvious depression or anxiety  BP Readings from Last 3 Encounters:  06/09/17 136/78  06/08/17 (!) 134/51  06/02/17 (!) 142/82   bp log reviewed  ASSESSMENT AND PLAN:  Discussed the following assessment and plan:  Essential hypertension w lability - some lability with inc readings at home esp in am   140 in pm 180 plus at times in am .   Cough, persistent resolving - residual st  asthma better  no further action at this time  Medication management Bp repeat  146/70 right arm  And 130/70 left arm (second) ocass skipped beat with bigeminy   bp lability    Reported at home   Inc carvedilol to 6.25 12.5   resp status is much improved .  No bacterial infection noted   .  Will send  Note to dr Luevenia Maxin al.  -Patient advised to return or notify health care team  if  new concerns arise.  Patient Instructions  Take  6.25 mg  Am and  12.5  Pm  Carvedilol    (  1 am and 2 pm ) If need be we can increae dosing as tolerated .   Your rep exam is good and should get better with time.  Lunge exam is clear  146/70 and 130/70  Today     Send in readings in another   2 weeks. I will send copy of plan to your ortho.     Standley Brooking. Caili Escalera M.D.

## 2017-06-09 ENCOUNTER — Ambulatory Visit (INDEPENDENT_AMBULATORY_CARE_PROVIDER_SITE_OTHER): Payer: Medicare Other | Admitting: Internal Medicine

## 2017-06-09 ENCOUNTER — Encounter: Payer: Self-pay | Admitting: Internal Medicine

## 2017-06-09 VITALS — BP 136/78 | HR 68 | Temp 98.1°F | Wt 201.0 lb

## 2017-06-09 DIAGNOSIS — I10 Essential (primary) hypertension: Secondary | ICD-10-CM | POA: Diagnosis not present

## 2017-06-09 DIAGNOSIS — Z79899 Other long term (current) drug therapy: Secondary | ICD-10-CM | POA: Diagnosis not present

## 2017-06-09 DIAGNOSIS — R053 Chronic cough: Secondary | ICD-10-CM

## 2017-06-09 DIAGNOSIS — R05 Cough: Secondary | ICD-10-CM

## 2017-06-09 MED ORDER — CARVEDILOL 6.25 MG PO TABS: ORAL_TABLET | ORAL | 3 refills | Status: DC

## 2017-06-09 NOTE — Progress Notes (Signed)
Anesthesia Chart Review:  Pt is a 72 year old female scheduled for R unicompartmental knee on 06/21/2017 with Marchia Bond, MD  - PCP is Shanon Ace, MD.  Last office visit 06/09/17.  [name] is aware of upcoming surgery.  Pt recently treated for asthma exacerbation; 06/09/17 office note indicates asthma better.   PMH includes:  HTN, impaired fasting glucose, hyperlipidemia, anemia, asthma, GERD. Former smoker. BMI 34.5. S/p R ORIF R elbow 04/19/14. S/p R elbow fx open treatment 10/26/13  Medications include: Albuterol, Advair, irbesartan, levothyroxine, carvedilol  BP (!) 134/51   Pulse (!) 54   Temp 36.8 C   Resp 20   Ht 5\' 4"  (1.626 m)   Wt 198 lb 8 oz (90 kg)   SpO2 100%   BMI 34.07 kg/m   Preoperative labs reviewed.    EKG 06/08/17: Sinus bradycardia (51 bpm) with PACs with Abberant conduction. Low voltage QRS  Echo 06/22/13:  - Left ventricle: The cavity size was normal. Systolicfunction was normal. The estimated ejection fraction wasin the range of 50% to 55%. Wall motion was normal; therewere no regional wall motion abnormalities. There was an increased relative contribution of atrial contraction toventricular filling. - Mitral valve: Calcified annulus. - Right ventricle: The cavity size was mildly dilated. Wallthickness was normal.  If no changes, I anticipate pt can proceed with surgery as scheduled.   Willeen Cass, FNP-BC [facility] Stay [facility]/Anesthesiology Phone: [phone] 06/09/2017 1:42 PM

## 2017-06-09 NOTE — Patient Instructions (Signed)
Take  6.25 mg  Am and 12.5  Pm  Carvedilol    ( 1 am and 2 pm ) If need be we can increae dosing as tolerated .   Your rep exam is good and should get better with time.  Lunge exam is clear  146/70 and 130/70  Today     Send in readings in another   2 weeks. I will send copy of plan to your ortho.

## 2017-06-10 NOTE — Telephone Encounter (Signed)
Patient seen in office yesterday.

## 2017-06-21 ENCOUNTER — Encounter (HOSPITAL_COMMUNITY): Admission: RE | Payer: Self-pay | Source: Ambulatory Visit

## 2017-06-21 ENCOUNTER — Ambulatory Visit (HOSPITAL_COMMUNITY): Admission: RE | Admit: 2017-06-21 | Payer: Medicare Other | Source: Ambulatory Visit | Admitting: Orthopedic Surgery

## 2017-06-21 SURGERY — ARTHROPLASTY, KNEE, UNICOMPARTMENTAL
Anesthesia: Spinal | Laterality: Right

## 2017-06-24 NOTE — Progress Notes (Signed)
[facility] [employer] PREP Weekly Session   Patient Details  Name: Marie Kelly MRN: [id number] Date of Birth: 07-20-1945 Age: 72 y.o. PCP: Burnis Medin, MD  Vitals:   06/22/17 1353  Weight: 202 lb (91.6 kg)    [employer] Weekly seesion - 06/24/17 1300      Weekly Session   Topic Discussed  Other Guest speaker   Guest speaker   Classes attended to date  [place] 06/24/2017, 1:53 PM

## 2017-06-28 ENCOUNTER — Ambulatory Visit: Payer: Medicare Other | Admitting: Adult Health

## 2017-06-28 NOTE — Progress Notes (Signed)
Chief Complaint  Patient presents with  . Cough    Cough x 3 days with ear pain. Cough is congested with green mucus. Denies fever. Not getting much relief from inhalers. Using Tylenol cold meds for sx's    HPI: Marie Kelly 72 y.o.    sda for above      Got better form last asthma resp infection  and then sick 3-4  days    And     Nose drainage and cough and ears.  [place] thick green  Phlegm         Little face pain .  Feels like sinus infection using flonase and saline and otc ears hyurt left more than right   No fever   Feels  Asthma not flaring .    Canceled the surgery  Until all better  And will reschedule    bp is better but needs refill 6.25 and 12.5 am  ROS: See pertinent positives and negatives per HPI.  Past Medical History:  Diagnosis Date  . Anemia    pt. denies  . Arthritis   . Asthma   . 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 Colon polyps   . Complication of anesthesia    2 days after surgery-muscles hurtand spasmed-needed a muscle relaxant   . Diverticulosis   . Fracture of right olecranon process 10/26/2013  . GERD (gastroesophageal reflux disease)    had endo? taking nexium 3 x per week  . Hyperlipidemia    had been on lipitor changes to pravachol cause of cost and tricor has since stopped  NO  se reported   . Hypertension   . Hypothyroidism     on meds for years  . Impaired fasting glucose   . Olecranon fracture 10/25/2013   right arm  . PONV (postoperative nausea and vomiting)   . Ventricular bigeminy seen on cardiac monitor    Cardiac work up Dr Stanford Breed [facility]  . Wears dentures    top-partial bottom  . Wears glasses     Family History  Problem Relation Age of Onset  . Stroke Mother   . Diabetes Father   . Heart disease Father   . Alzheimer's disease Unknown   . Stomach cancer Maternal Aunt   . Colon cancer Neg Hx   . Pancreatic cancer Neg Hx     Social History   Socioeconomic History  .  Marital status: Married    Spouse name: None  . Number of children: 3  . Years of education: None  . Highest education level: None  Social Needs  . Financial resource strain: None  . Food insecurity - worry: None  . Food insecurity - inability: None  . Transportation needs - medical: None  . Transportation needs - non-medical: None  Occupational History  . None  Tobacco Use  . Smoking status: Former Smoker    Last attempt to quit: 10/25/1973    Years since quitting: 43.7  . Smokeless tobacco: Never Used  Substance and Sexual Activity  . Alcohol use: Yes    Alcohol/week: 0.0 oz    Comment: socially  . Drug use: No  . Sexual activity: None  Other Topics Concern  . 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    Retired from [employer] in  [place] high school education   No pets .    Gravida 3 para 3   Last td 6 2008   HS educated       REmote hx of tobacco 1.5 ppd for 6 years as a teen Q 45    Outpatient Medications Prior to Visit  Medication Sig Dispense Refill  . albuterol (PROVENTIL HFA;VENTOLIN HFA) 108 (90 Base) MCG/ACT inhaler Inhale 2 puffs into the lungs every 6 (six) hours as needed for wheezing or shortness of breath. 1 Inhaler 1  . bimatoprost (LUMIGAN) 0.01 % SOLN Place 1 drop into both eyes at bedtime.    . Fluticasone-Salmeterol (ADVAIR) 250-50 MCG/DOSE AEPB Inhale 1 puff into the lungs 2 (two) times daily. 1 each 6  . irbesartan (AVAPRO) 300 MG tablet TAKE ONE TABLET BY MOUTH DAILY 90 tablet 3  . levothyroxine (SYNTHROID, LEVOTHROID) 50 MCG tablet TAKE 1 TABLET DAILY 90 tablet 1  . meloxicam (MOBIC) 15 MG tablet Take 15 mg by mouth daily as needed for pain.    . montelukast (SINGULAIR) 10 MG tablet Take 1 tablet (10 mg total) by mouth at bedtime. 90 tablet 3  . tolterodine (DETROL LA) 2 MG 24 hr capsule Take 1 capsule (2 mg total) by mouth daily. 90 capsule 3  . carvedilol  (COREG) 6.25 MG tablet 6.25 in am and 12.5 mg in pm 90 tablet 3   No facility-administered medications prior to visit.      EXAM:  BP 126/80 (BP Location: Right Arm, Patient Position: Sitting, Cuff Size: Normal)   Pulse 62   Temp 98.4 F (36.9 C) (Oral)   Wt 199 lb 4.8 oz (90.4 kg)   SpO2 98%   BMI 34.21 kg/m   Body mass index is 34.21 kg/m. WDWN in NAD  quiet respirations; mildly congested  somewhat hoarse. Non toxic .  bronchitisi cough  HEENT: Normocephalic ;atraumatic , Eyes;  PERRL, EOMs  Full, lids and conjunctiva clear,,Ears: no deformities, canals nl, TM landmarks normal, Nose: no deformity or discharge but congested;face minimally tender Mouth : OP clear without lesion or edema . Neck: Supple without adenopathy or masses or bruits Chest:  Clear to A without wheezes rales or rhonchi CV:  S1-S2 no gallops or murmurs peripheral perfusion is normal Skin :nl perfusion and no acute rashes  PSYCH: pleasant and cooperative, no obvious depression or anxiety  ASSESSMENT AND PLAN:  Discussed the following assessment and plan:  Acute upper respiratory infection of multiple sites  Acute sinusitis, recurrence not specified, unspecified location  Essential hypertension w lability - better controlled    refill med and continue  Medication management Poss relapsing sinusitis vs new illness based onseverity  Add antibiotic and other measures   .expom  -Patient advised to return or notify health care team  if symptoms worsen ,persist or new concerns arise.  Patient Instructions    Continue flonase saline and decongestant  Adding antibiotic for possible bacterial sinus infection.  Chest is clear today .   Sending in  BP medicatoin also   Let us know if fever     serios pain etc cough may last another week .       Standley Brooking. Addie Cederberg M.D.

## 2017-06-29 ENCOUNTER — Encounter: Payer: Self-pay | Admitting: Internal Medicine

## 2017-06-29 ENCOUNTER — Ambulatory Visit: Payer: Medicare Other | Admitting: Adult Health

## 2017-06-29 ENCOUNTER — Ambulatory Visit (INDEPENDENT_AMBULATORY_CARE_PROVIDER_SITE_OTHER): Payer: Medicare Other | Admitting: Internal Medicine

## 2017-06-29 VITALS — BP 126/80 | HR 62 | Temp 98.4°F | Wt 199.3 lb

## 2017-06-29 DIAGNOSIS — I10 Essential (primary) hypertension: Secondary | ICD-10-CM

## 2017-06-29 DIAGNOSIS — J019 Acute sinusitis, unspecified: Secondary | ICD-10-CM

## 2017-06-29 DIAGNOSIS — Z79899 Other long term (current) drug therapy: Secondary | ICD-10-CM

## 2017-06-29 DIAGNOSIS — J069 Acute upper respiratory infection, unspecified: Secondary | ICD-10-CM | POA: Diagnosis not present

## 2017-06-29 MED ORDER — AMOXICILLIN-POT CLAVULANATE 875-125 MG PO TABS: 1.0000 | ORAL_TABLET | Freq: Two times a day (BID) | ORAL | 0 refills | Status: DC

## 2017-06-29 MED ORDER — CARVEDILOL 6.25 MG PO TABS: ORAL_TABLET | ORAL | 6 refills | Status: DC

## 2017-06-29 NOTE — Patient Instructions (Signed)
Continue flonase saline and decongestant  Adding antibiotic for possible bacterial sinus infection.  Chest is clear today .   Sending in  BP medicatoin also   Let us know if fever     serios pain etc cough may last another week .

## 2017-07-08 DIAGNOSIS — H401233 Low-tension glaucoma, bilateral, severe stage: Secondary | ICD-10-CM | POA: Diagnosis not present

## 2017-07-08 DIAGNOSIS — H04123 Dry eye syndrome of bilateral lacrimal glands: Secondary | ICD-10-CM | POA: Diagnosis not present

## 2017-07-22 ENCOUNTER — Telehealth: Payer: Self-pay | Admitting: Internal Medicine

## 2017-07-22 MED ORDER — TELMISARTAN 80 MG PO TABS: 80.0000 mg | ORAL_TABLET | Freq: Every day | ORAL | 2 refills | Status: DC

## 2017-07-22 NOTE — Telephone Encounter (Signed)
Rx phoned in to pharmacy. Nothing further needed.

## 2017-07-22 NOTE — Telephone Encounter (Signed)
Irbesartan 300 mg on back order, will need a new medication to be used in it's place.   Last  OV: 06/29/17 PCP: Regis Bill [facility]: [name] [place], [place] [phone] (Phone) [phone] (Fax)

## 2017-07-22 NOTE — Telephone Encounter (Signed)
[place] to find out what is covered as Alternative - we cannot prescribe without knowing an alternative...  Losartan is an alternative but was on recall recently  Valsartan was recalled as well Olmesartan is on back order Telmisartan is the last alternative and they have plenty of this on hand  Please advise Dr Regis Bill, thanks.

## 2017-07-22 NOTE — Telephone Encounter (Signed)
Wow lots of recalls   Ok to do telmisartan  80 mg per day  Disp 30 refill x 2

## 2017-07-22 NOTE — Telephone Encounter (Signed)
Copied from [place] [id number]. Topic: Quick Communication - See Telephone Encounter >> Jul 22, 2017 12:24 PM Burnis Medin, NT wrote: CRM for notification. See Telephone encounter for: Marie Kelly called and said that irbesartan (AVAPRO) 300 MG tablet is on back order and wanted to see if something else can be sent in place of this medication. [facility] uses [name] [place], [place] [phone] (Phone) [phone] (Fax)    07/22/17.

## 2017-07-24 ENCOUNTER — Other Ambulatory Visit: Payer: Self-pay | Admitting: Internal Medicine

## 2017-08-08 ENCOUNTER — Telehealth: Payer: Self-pay | Admitting: Internal Medicine

## 2017-08-08 DIAGNOSIS — H401233 Low-tension glaucoma, bilateral, severe stage: Secondary | ICD-10-CM | POA: Diagnosis not present

## 2017-08-08 DIAGNOSIS — H1132 Conjunctival hemorrhage, left eye: Secondary | ICD-10-CM | POA: Diagnosis not present

## 2017-08-08 DIAGNOSIS — Z961 Presence of intraocular lens: Secondary | ICD-10-CM | POA: Diagnosis not present

## 2017-08-08 DIAGNOSIS — H04123 Dry eye syndrome of bilateral lacrimal glands: Secondary | ICD-10-CM | POA: Diagnosis not present

## 2017-08-08 MED ORDER — CARVEDILOL 6.25 MG PO TABS: ORAL_TABLET | ORAL | 6 refills | Status: DC

## 2017-08-08 NOTE — Telephone Encounter (Addendum)
On 06/29/17 there was a Rx placed on file for #90 ( 3 pills a day ) Not sure why it was not sent to pharmacy Pt states that she has been getting only #60 from pharmacy but as been taking 3 a day as directed last OV -- running out early . I advised that I would go ahead and send the medication for #90 on file.   Nothing further needed.

## 2017-08-08 NOTE — Telephone Encounter (Signed)
Copied from [place] [id number]. Topic: Quick Communication - See Telephone Encounter >> Aug 08, 2017 10:35 AM Conception Chancy, NT wrote: CRM for notification. See Telephone encounter for: 08/08/17.  Patient states Dr. Lenard Forth increased carvedilol (COREG) 6.25 MG tablet . States she is supposed to take 1 in the morning and 2 before bedtime. She states she only had 60 when she got a refill and she was needing 90. She is needing 30 more at the moment and in the future 90 tablets.   [place], [place]  [place] [address]  Phone: [phone] Fax: [phone]

## 2017-08-17 NOTE — Progress Notes (Signed)
Reached out to Marie Kelly regarding her and her husband and the PREP classes/training.

## 2017-09-13 DIAGNOSIS — L821 Other seborrheic keratosis: Secondary | ICD-10-CM | POA: Diagnosis not present

## 2017-09-13 DIAGNOSIS — B0089 Other herpesviral infection: Secondary | ICD-10-CM | POA: Diagnosis not present

## 2017-09-13 DIAGNOSIS — L814 Other melanin hyperpigmentation: Secondary | ICD-10-CM | POA: Diagnosis not present

## 2017-09-13 DIAGNOSIS — D225 Melanocytic nevi of trunk: Secondary | ICD-10-CM | POA: Diagnosis not present

## 2017-10-11 ENCOUNTER — Other Ambulatory Visit: Payer: Self-pay | Admitting: Internal Medicine

## 2017-10-20 DIAGNOSIS — H43813 Vitreous degeneration, bilateral: Secondary | ICD-10-CM | POA: Diagnosis not present

## 2017-10-20 DIAGNOSIS — H35373 Puckering of macula, bilateral: Secondary | ICD-10-CM | POA: Diagnosis not present

## 2017-10-23 ENCOUNTER — Other Ambulatory Visit: Payer: Self-pay | Admitting: Internal Medicine

## 2017-11-23 ENCOUNTER — Ambulatory Visit: Payer: Medicare Other

## 2017-11-28 ENCOUNTER — Other Ambulatory Visit: Payer: Self-pay | Admitting: Internal Medicine

## 2017-11-28 DIAGNOSIS — H04123 Dry eye syndrome of bilateral lacrimal glands: Secondary | ICD-10-CM | POA: Diagnosis not present

## 2017-11-28 DIAGNOSIS — Z961 Presence of intraocular lens: Secondary | ICD-10-CM | POA: Diagnosis not present

## 2017-11-28 DIAGNOSIS — Z1231 Encounter for screening mammogram for malignant neoplasm of breast: Secondary | ICD-10-CM

## 2017-11-28 DIAGNOSIS — H35371 Puckering of macula, right eye: Secondary | ICD-10-CM | POA: Diagnosis not present

## 2017-11-28 DIAGNOSIS — H401233 Low-tension glaucoma, bilateral, severe stage: Secondary | ICD-10-CM | POA: Diagnosis not present

## 2017-12-05 ENCOUNTER — Ambulatory Visit: Payer: Medicare Other | Admitting: Internal Medicine

## 2017-12-14 ENCOUNTER — Other Ambulatory Visit: Payer: Self-pay | Admitting: Internal Medicine

## 2017-12-19 NOTE — Progress Notes (Signed)
Chief Complaint  Patient presents with  . Annual Exam    Pt having pain in left middle finger - full ROM.   Marland Kitchen Medication Management  . Hypertension  . Hypothyroidism    HPI: Marie Kelly 72 y.o. comes in today for yearly   Medicare visit .Since last visit. gotint to tops losing weight Bp good   No fainting     Allergy asthma    Take inhalers   When needed and helpful  noasthma falres   detrol for years  ? Helping   Had stress incontinence  hasnt tried off ewith exercises   Health Maintenance  Topic Date Due  . INFLUENZA VACCINE  12/15/2017  . MAMMOGRAM  10/16/2018  . TETANUS/TDAP  09/08/2021  . COLONOSCOPY  02/04/2026  . DEXA SCAN  Completed  . Hepatitis C Screening  Completed  . PNA vac Low Risk Adult  Completed   Health Maintenance Review LIFESTYLE:  Exercise:   Jointed tops   Now   Losing weight   Tobacco/ETS:  no Alcohol:   noocass Sugar beverages:  no Sleep: good at least 8  Drug use: no HH: 2    Hearing: ok  Vision:  No limitations at present . Last eye check UTD  q 6 months   Safety:  Has smoke detector and wears seat belts. s. No excess sun exposure. Sees dentist regularly.  Falls:   Preventive parameters: up-to-date  Reviewed   ADLS:   There are no problems or need for assistance  driving, feeding, obtaining food, dressing, toileting and bathing, managing money using phone. She is independent.     ROS: gets leg and thigh   cramps  Left middle finger is sore no injury or swelling  Here for 2-3 days GEN/ HEENT: No fever, significant weight changes sweats headaches vision problems hearing changes, CV/ PULM; No chest pain shortness of breath cough, syncope,edema  change in exercise tolerance. GI /GU: No adominal pain, vomiting, change in bowel habits. No blood in the stool. No significant GU symptoms. SKIN/HEME: ,no acute skin rashes suspicious lesions or bleeding. No lymphadenopathy, nodules, masses.  NEURO/ PSYCH:  No neurologic signs such as  weakness numbness. No depression anxiety. IMM/ Allergy: No unusual infections.  Allergy .   REST of 12 system review negative except as per HPI   Past Medical History:  Diagnosis Date  . Anemia    pt. denies  . Arthritis   . Asthma   . 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 Colon polyps   . Complication of anesthesia    2 days after surgery-muscles hurtand spasmed-needed a muscle relaxant   . Diverticulosis   . Fracture of right olecranon process 10/26/2013  . GERD (gastroesophageal reflux disease)    had endo? taking nexium 3 x per week  . Hyperlipidemia    had been on lipitor changes to pravachol cause of cost and tricor has since stopped  NO  se reported   . Hypertension   . Hypothyroidism     on meds for years  . Impaired fasting glucose   . Olecranon fracture 10/25/2013   right arm  . PONV (postoperative nausea and vomiting)   . Ventricular bigeminy seen on cardiac monitor    Cardiac work up Dr Stanford Breed [facility]  . Wears dentures    top-partial bottom  . Wears glasses     Family History  Problem Relation Age of Onset  . Stroke  Mother   . Diabetes Father   . Heart disease Father   . Alzheimer's disease Unknown   . Stomach cancer Maternal Aunt   . Colon cancer Neg Hx   . Pancreatic cancer Neg Hx     Social History   Socioeconomic History  . Marital status: Married    Spouse name: Not on file  . Number of children: 3  . Years of education: Not on file  . Highest education level: Not on file  Occupational History  . Not on file  Social Needs  . Financial resource strain: Not on file  . Food insecurity:    Worry: Not on file    Inability: Not on file  . Transportation needs:    Medical: Not on file    Non-medical: Not on file  Tobacco Use  . Smoking status: Former Smoker    Last attempt to quit: 10/25/1973    Years since quitting: 44.1  . Smokeless tobacco: Never Used  Substance and Sexual Activity  .  Alcohol use: Yes    Alcohol/week: 0.0 oz    Comment: socially  . Drug use: No  . Sexual activity: Not on file  Lifestyle  . Physical activity:    Days per week: Not on file    Minutes per session: Not on file  . Stress: Not on file  Relationships  . Social connections:    Talks on phone: Not on file    Gets together: Not on file    Attends religious service: Not on file    Active member of club or organization: Not on file    Attends meetings of clubs or organizations: Not on file    Relationship status: Not on file  Other Topics Concern  . 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    Gravida 3 para 3   Last td 6 2008   HS educated       REmote hx of tobacco 1.5 ppd for 6 years as a teen Q 32    Outpatient Encounter Medications as of 12/20/2017  Medication Sig  . albuterol (PROVENTIL HFA;VENTOLIN HFA) 108 (90 Base) MCG/ACT inhaler Inhale 2 puffs into the lungs every 6 (six) hours as needed for wheezing or shortness of breath.  . bimatoprost (LUMIGAN) 0.01 % SOLN Place 1 drop into both eyes at bedtime.  . carvedilol (COREG) 6.25 MG tablet 6.25 in am and 12.5 mg in pm  . Fluticasone-Salmeterol (ADVAIR) 250-50 MCG/DOSE AEPB Inhale 1 puff into the lungs 2 (two) times daily.  Marland Kitchen levothyroxine (SYNTHROID, LEVOTHROID) 50 MCG tablet Take 1 tablet (50 mcg total) by mouth daily.  . meloxicam (MOBIC) 15 MG tablet Take 15 mg by mouth daily as needed for pain.  . montelukast (SINGULAIR) 10 MG tablet Take 1 tablet (10 mg total) by mouth at bedtime.  Marland Kitchen telmisartan (MICARDIS) 80 MG tablet Take 1 tablet (80 mg total) by mouth daily.  Marland Kitchen tolterodine (DETROL LA) 2 MG 24 hr capsule Take 1 capsule (2 mg total) by mouth daily.  . [DISCONTINUED] carvedilol (COREG) 6.25 MG tablet 6.25 in am and 12.5 mg in pm  . [DISCONTINUED] levothyroxine  (SYNTHROID, LEVOTHROID) 50 MCG tablet TAKE 1 TABLET DAILY  . [DISCONTINUED] montelukast (SINGULAIR)  10 MG tablet Take 1 tablet (10 mg total) by mouth at bedtime.  . [DISCONTINUED] telmisartan (MICARDIS) 80 MG tablet TAKE ONE TABLET BY MOUTH DAILY  . [DISCONTINUED] tolterodine (DETROL LA) 2 MG 24 hr capsule Take 1 capsule (2 mg total) by mouth daily.  . [DISCONTINUED] amoxicillin-clavulanate (AUGMENTIN) 875-125 MG tablet Take 1 tablet by mouth every 12 (twelve) hours. (Patient not taking: Reported on 12/20/2017)   No facility-administered encounter medications on file as of 12/20/2017.     EXAM:  BP 112/72 (BP Location: Right Arm, Patient Position: Sitting, Cuff Size: Normal)   Pulse (!) 57   Temp 98.3 F (36.8 C) (Oral)   Ht 5\' 4"  (1.626 m)   Wt 198 lb (89.8 kg)   BMI 33.99 kg/m   Body mass index is 33.99 kg/m.  Physical Exam: Vital signs reviewed [id number] is a well-developed well-nourished alert cooperative   who appears stated age in no acute distress.  HEENT: normocephalic atraumatic , Eyes: PERRL EOM's full, conjunctiva clear, Nares: paten,t no deformity discharge or tenderness., Ears: no deformity EAC's clear TMs with normal landmarks. Mouth: clear OP, no lesions, edema.  Moist mucous membranes. Dentition in adequate repair. NECK: supple without masses, thyromegaly or bruits. CHEST/PULM:  Clear to auscultation and percussion breath sounds equal no wheeze , rales or rhonchi. No chest wall deformities or tenderness.Breast: normal by inspection . No dimpling, discharge, masses, tenderness or discharge . CV: PMI is nondisplaced, S1 S2 no gallops, murmurs, rubs. Peripheral pulses are present  dp 1+  No edema ABDOMEN: Bowel sounds normal nontender  No guard or rebound, no hepato splenomegal no CVA tenderness.   Extremtities:  No clubbing cyanosis or edema, no acute joint swelling  Some djd chnages knee   Finger no swelling and nl room no click  No obvious  redness no focal atrophy NEURO:   Oriented x3, cranial nerves 3-12 appear to be intact, no obvious focal weakness,gait within normal limits no abnormal reflexes or asymmetrical SKIN: No acute rashes normal turgor, color, no bruising or petechiae. PSYCH: Oriented, good eye contact, no obvious depression anxiety, cognition and judgment appear normal. LN: no cervical axillary inguinal adenopathy No noted deficits in memory, attention, and speech.   Lab Results  Component Value Date   WBC 8.2 06/08/2017   HGB 13.0 06/08/2017   HCT 39.4 06/08/2017   PLT 274 06/08/2017   GLUCOSE 93 06/08/2017   CHOL 206 (H) 11/30/2016   TRIG 111.0 11/30/2016   HDL 48.70 11/30/2016   LDLDIRECT 168.5 04/23/2013   LDLCALC 135 (H) 11/30/2016   ALT 26 11/30/2016   AST 23 11/30/2016   NA 138 06/08/2017   K 4.4 06/08/2017   CL 105 06/08/2017   CREATININE 0.82 06/08/2017   BUN 20 06/08/2017   CO2 23 06/08/2017   TSH 3.76 11/30/2016   HGBA1C 5.5 11/30/2016    ASSESSMENT AND PLAN:  Discussed the following assessment and plan:  Hypothyroidism, unspecified type - Plan: Basic metabolic panel, CBC with Differential/Platelet, Hepatic function panel, Lipid panel, TSH  Medication management - Plan: Basic metabolic panel, CBC with Differential/Platelet, Hepatic function panel, Lipid panel, TSH  Hyperlipidemia, unspecified hyperlipidemia type - Plan: Basic metabolic panel, CBC with Differential/Platelet, Hepatic function panel, Lipid panel, TSH  Essential hypertension - Plan: Basic metabolic panel, CBC with Differential/Platelet, Hepatic function panel, Lipid panel, TSH  Leg cramps - Plan: Basic metabolic panel, CBC with Differential/Platelet, Hepatic function panel, TSH, Magnesium  Impaired fasting glucose  Intermittent asthma without complication, unspecified asthma  severity Disc indication for pap no hx of abnormal  In past years and no  Hx cancer   Offered pelvic exam but  declined today    No hx of high risk  For pap  Monitoring  Said  that  She had regular check ups per gyne  That were normal  Counseled. screeningvs sx and any sx  Of concern hen check back  Finger  Exam follow no obv trauma or injury  protection and fu if  persistent or progressive    Patient Care Team: Jacob Chamblee, Standley Brooking, MD as PCP - General (Internal Medicine) Shon Hough, MD (Ophthalmology) Marchia Bond, MD as Consulting Physician (Orthopedic Surgery)  Patient Instructions  Glad  You are doing well   If pulse too low  Below 55 or feel dizzy we may need to adjust or back off on the  BP medication.   Will notify you  of labs when available.   Leg cramps  Can try off detrol  In future and do kegel exercise   If any pelvic sx we can do pelvic exam at any time  But pap smear not helpful  If normals in past .    Leg Cramps Leg cramps occur when a muscle or muscles tighten and you have no control over this tightening (involuntary muscle contraction). Muscle cramps can develop in any muscle, but the most common place is in the calf muscles of the leg. Those cramps can occur during exercise or when you are at rest. Leg cramps are painful, and they may last for a few seconds to a few minutes. Cramps may return several times before they finally stop. Usually, leg cramps are not caused by a serious medical problem. In many cases, the cause is not known. Some common causes include:  Overexertion.  Overuse from repetitive motions, or doing the same thing over and over.  Remaining in a certain position for a long period of time.  Improper preparation, form, or technique while performing a sport or an activity.  Dehydration.  Injury.  Side effects of some medicines.  Abnormally low levels of the salts and ions in your blood (electrolytes), especially potassium and calcium. These levels could be low if you are taking water pills (diuretics) or if you are pregnant.  Follow these instructions at home: Watch your condition for any changes. Taking  the following actions may help to lessen any discomfort that you are feeling:  Stay well-hydrated. Drink enough fluid to keep your urine clear or pale yellow.  Try massaging, stretching, and relaxing the affected muscle. Do this for several minutes at a time.  For tight or tense muscles, use a warm towel, heating pad, or hot shower water directed to the affected area.  If you are sore or have pain after a cramp, applying ice to the affected area may relieve discomfort. ? Put ice in a plastic bag. ? Place a towel between your skin and the bag. ? Leave the ice on for 20 minutes, 2-3 times per day.  Avoid strenuous exercise for several days if you have been having frequent leg cramps.  Make sure that your diet includes the essential minerals for your muscles to work normally.  Take medicines only as directed by your health care provider.  Contact a health care provider if:  Your leg cramps get more severe or more frequent, or they do not improve over time.  Your foot becomes cold, numb, or blue. This information is not intended to  replace advice given to you by your health care provider. Make sure you discuss any questions you have with your health care provider. Document Released: 06/10/2004 Document Revised: 10/09/2015 Document Reviewed: 04/10/2014 Elsevier Interactive Patient Education  [address]. Hamsini Verrilli M.D.

## 2017-12-20 ENCOUNTER — Encounter: Payer: Self-pay | Admitting: Internal Medicine

## 2017-12-20 ENCOUNTER — Ambulatory Visit (INDEPENDENT_AMBULATORY_CARE_PROVIDER_SITE_OTHER): Payer: Medicare Other | Admitting: Internal Medicine

## 2017-12-20 VITALS — BP 112/72 | HR 57 | Temp 98.3°F | Ht 64.0 in | Wt 198.0 lb

## 2017-12-20 DIAGNOSIS — R252 Cramp and spasm: Secondary | ICD-10-CM

## 2017-12-20 DIAGNOSIS — J4520 Mild intermittent asthma, uncomplicated: Secondary | ICD-10-CM

## 2017-12-20 DIAGNOSIS — E785 Hyperlipidemia, unspecified: Secondary | ICD-10-CM

## 2017-12-20 DIAGNOSIS — E039 Hypothyroidism, unspecified: Secondary | ICD-10-CM | POA: Diagnosis not present

## 2017-12-20 DIAGNOSIS — R7301 Impaired fasting glucose: Secondary | ICD-10-CM | POA: Diagnosis not present

## 2017-12-20 DIAGNOSIS — Z79899 Other long term (current) drug therapy: Secondary | ICD-10-CM

## 2017-12-20 DIAGNOSIS — I10 Essential (primary) hypertension: Secondary | ICD-10-CM | POA: Diagnosis not present

## 2017-12-20 LAB — CBC WITH DIFFERENTIAL/PLATELET
BASOS ABS: 0 10*3/uL (ref 0.0–0.1)
BASOS PCT: 0.5 % (ref 0.0–3.0)
EOS PCT: 11.8 % — AB (ref 0.0–5.0)
Eosinophils Absolute: 0.4 10*3/uL (ref 0.0–0.7)
HEMATOCRIT: 38.4 % (ref 36.0–46.0)
Hemoglobin: 12.9 g/dL (ref 12.0–15.0)
LYMPHS PCT: 28.1 % (ref 12.0–46.0)
Lymphs Abs: 1 10*3/uL (ref 0.7–4.0)
MCHC: 33.7 g/dL (ref 30.0–36.0)
MCV: 88.2 fl (ref 78.0–100.0)
MONOS PCT: 16.7 % — AB (ref 3.0–12.0)
Monocytes Absolute: 0.6 10*3/uL (ref 0.1–1.0)
NEUTROS ABS: 1.5 10*3/uL (ref 1.4–7.7)
Neutrophils Relative %: 42.9 % — ABNORMAL LOW (ref 43.0–77.0)
PLATELETS: 245 10*3/uL (ref 150.0–400.0)
RBC: 4.35 Mil/uL (ref 3.87–5.11)
RDW: 13.8 % (ref 11.5–15.5)
WBC: 3.6 10*3/uL — ABNORMAL LOW (ref 4.0–10.5)

## 2017-12-20 LAB — TSH: TSH: 3.13 u[IU]/mL (ref 0.35–4.50)

## 2017-12-20 LAB — BASIC METABOLIC PANEL
BUN: 11 mg/dL (ref 6–23)
CHLORIDE: 101 meq/L (ref 96–112)
CO2: 28 meq/L (ref 19–32)
Calcium: 10.1 mg/dL (ref 8.4–10.5)
Creatinine, Ser: 0.83 mg/dL (ref 0.40–1.20)
GFR: 71.69 mL/min (ref >60.00)
Glucose, Bld: 111 mg/dL — ABNORMAL HIGH (ref 70–99)
POTASSIUM: 5 meq/L (ref 3.5–5.1)
SODIUM: 136 meq/L (ref 135–145)

## 2017-12-20 LAB — HEPATIC FUNCTION PANEL
ALBUMIN: 4.8 g/dL (ref 3.5–5.2)
ALK PHOS: 43 U/L (ref 39–117)
ALT: 23 U/L (ref 0–35)
AST: 18 U/L (ref 0–37)
Bilirubin, Direct: 0.1 mg/dL (ref 0.0–0.3)
TOTAL PROTEIN: 7.3 g/dL (ref 6.0–8.3)
Total Bilirubin: 0.7 mg/dL (ref 0.2–1.2)

## 2017-12-20 LAB — LIPID PANEL
Cholesterol: 216 mg/dL — ABNORMAL HIGH (ref 0–200)
HDL: 47.7 mg/dL (ref >39.00)
LDL Cholesterol: 146 mg/dL — ABNORMAL HIGH (ref 0–99)
NONHDL: 168.32
Total CHOL/HDL Ratio: 5
Triglycerides: 113 mg/dL (ref 0.0–149.0)
VLDL: 22.6 mg/dL (ref 0.0–40.0)

## 2017-12-20 LAB — MAGNESIUM: Magnesium: 2.1 mg/dL (ref 1.5–2.5)

## 2017-12-20 MED ORDER — MONTELUKAST SODIUM 10 MG PO TABS: 10.0000 mg | ORAL_TABLET | Freq: Every day | ORAL | 3 refills | Status: DC

## 2017-12-20 MED ORDER — CARVEDILOL 6.25 MG PO TABS: ORAL_TABLET | ORAL | 3 refills | Status: DC

## 2017-12-20 MED ORDER — TELMISARTAN 80 MG PO TABS: 80.0000 mg | ORAL_TABLET | Freq: Every day | ORAL | 3 refills | Status: DC

## 2017-12-20 MED ORDER — LEVOTHYROXINE SODIUM 50 MCG PO TABS: 50.0000 ug | ORAL_TABLET | Freq: Every day | ORAL | 3 refills | Status: DC

## 2017-12-20 MED ORDER — TOLTERODINE TARTRATE ER 2 MG PO CP24: 2.0000 mg | ORAL_CAPSULE | Freq: Every day | ORAL | 3 refills | Status: DC

## 2017-12-20 NOTE — Patient Instructions (Addendum)
Glad  You are doing well   If pulse too low  Below 55 or feel dizzy we may need to adjust or back off on the  BP medication.   Will notify you  of labs when available.   Leg cramps  Can try off detrol  In future and do kegel exercise   If any pelvic sx we can do pelvic exam at any time  But pap smear not helpful  If normals in past .    Leg Cramps Leg cramps occur when a muscle or muscles tighten and you have no control over this tightening (involuntary muscle contraction). Muscle cramps can develop in any muscle, but the most common place is in the calf muscles of the leg. Those cramps can occur during exercise or when you are at rest. Leg cramps are painful, and they may last for a few seconds to a few minutes. Cramps may return several times before they finally stop. Usually, leg cramps are not caused by a serious medical problem. In many cases, the cause is not known. Some common causes include:  Overexertion.  Overuse from repetitive motions, or doing the same thing over and over.  Remaining in a certain position for a long period of time.  Improper preparation, form, or technique while performing a sport or an activity.  Dehydration.  Injury.  Side effects of some medicines.  Abnormally low levels of the salts and ions in your blood (electrolytes), especially potassium and calcium. These levels could be low if you are taking water pills (diuretics) or if you are pregnant.  Follow these instructions at home: Watch your condition for any changes. Taking the following actions may help to lessen any discomfort that you are feeling:  Stay well-hydrated. Drink enough fluid to keep your urine clear or pale yellow.  Try massaging, stretching, and relaxing the affected muscle. Do this for several minutes at a time.  For tight or tense muscles, use a warm towel, heating pad, or hot shower water directed to the affected area.  If you are sore or have pain after a cramp, applying  ice to the affected area may relieve discomfort. ? Put ice in a plastic bag. ? Place a towel between your skin and the bag. ? Leave the ice on for 20 minutes, 2-3 times per day.  Avoid strenuous exercise for several days if you have been having frequent leg cramps.  Make sure that your diet includes the essential minerals for your muscles to work normally.  Take medicines only as directed by your health care provider.  Contact a health care provider if:  Your leg cramps get more severe or more frequent, or they do not improve over time.  Your foot becomes cold, numb, or blue. This information is not intended to replace advice given to you by your health care provider. Make sure you discuss any questions you have with your health care provider. Document Released: 06/10/2004 Document Revised: 10/09/2015 Document Reviewed: 04/10/2014 Elsevier Interactive Patient Education  [employer].

## 2017-12-27 ENCOUNTER — Encounter: Payer: Self-pay | Admitting: *Deleted

## 2017-12-29 NOTE — Telephone Encounter (Signed)
Please advise Dr Regis Bill, thanks.  Pt states that she was fasting for her labs last OV when her Glucose was elevated to 111.  Any recommendations?

## 2018-01-01 NOTE — Telephone Encounter (Signed)
The level is a prediabetic level. Avoiding processed carbohydrates and sugars  And  Physical activity  can control the sugar . We can get you to a dietician if you wish.  After working on diet and activity  We can repeat fasting blood sugar  Lipids and add hg a1c  Test to  See if controlled .   In 3 months.   Can make FU OV if  wishes to discuss  further

## 2018-01-02 ENCOUNTER — Ambulatory Visit: Payer: Medicare Other

## 2018-01-02 ENCOUNTER — Encounter: Payer: Self-pay | Admitting: *Deleted

## 2018-01-02 DIAGNOSIS — M7542 Impingement syndrome of left shoulder: Secondary | ICD-10-CM | POA: Diagnosis not present

## 2018-01-05 DIAGNOSIS — M25512 Pain in left shoulder: Secondary | ICD-10-CM | POA: Diagnosis not present

## 2018-01-05 DIAGNOSIS — M6281 Muscle weakness (generalized): Secondary | ICD-10-CM | POA: Diagnosis not present

## 2018-01-05 DIAGNOSIS — M7542 Impingement syndrome of left shoulder: Secondary | ICD-10-CM | POA: Diagnosis not present

## 2018-01-10 NOTE — Telephone Encounter (Signed)
I keep getting those leg cramps I told you about. Was my potassium ok? Anything I can do or take. Even if I move a certain way they start to cramp. Both legs. Thanks so much.  Dr Regis Bill, please advise. Her last check on her potassium was earlier this month and it was 5.0.  Any other recs for her?  Thanks

## 2018-01-12 NOTE — Telephone Encounter (Signed)
Please advise Dr Regis Bill, thanks.  See also message from 8/27

## 2018-01-12 NOTE — Telephone Encounter (Signed)
Your blood   For potassium magnesium and thyroid was normal  No specific cause that would  Cause the leg cramping. Assuming this is  Night leg cramps   There is no perfect  medication or plan after stretching  exercises 4 x per day  be fore bed. And avoiding alcohol and caffeine to stay hydrated   Some people have benefited from tonic water  That  has quinine in it.  Vitamin B complexes have  been used   Although your circulation seems ok we can get  A arterial   Test in  lower legs to be sure flow is ok .   There are meany meds that have been tried but not always helpful    So  Do the stretching 4 x per day   Add vit b complex daily   Get ABI to check blood flow in legs  ( please order test if ok with patient )  Then come back to talk about this with results  If still having cramps to decide on next step    I

## 2018-01-12 NOTE — Telephone Encounter (Signed)
Please see my other message reply .   Lab Results  Component Value Date   WBC 3.6 (L) 12/20/2017   HGB 12.9 12/20/2017   HCT 38.4 12/20/2017   PLT 245.0 12/20/2017   GLUCOSE 111 (H) 12/20/2017   CHOL 216 (H) 12/20/2017   TRIG 113.0 12/20/2017   HDL 47.70 12/20/2017   LDLDIRECT 168.5 04/23/2013   LDLCALC 146 (H) 12/20/2017   ALT 23 12/20/2017   AST 18 12/20/2017   NA 136 12/20/2017   K 5.0 12/20/2017   CL 101 12/20/2017   CREATININE 0.83 12/20/2017   BUN 11 12/20/2017   CO2 28 12/20/2017   TSH 3.13 12/20/2017   HGBA1C 5.5 11/30/2016

## 2018-01-13 DIAGNOSIS — M25512 Pain in left shoulder: Secondary | ICD-10-CM | POA: Diagnosis not present

## 2018-01-13 DIAGNOSIS — M7542 Impingement syndrome of left shoulder: Secondary | ICD-10-CM | POA: Diagnosis not present

## 2018-01-13 DIAGNOSIS — M6281 Muscle weakness (generalized): Secondary | ICD-10-CM | POA: Diagnosis not present

## 2018-01-20 ENCOUNTER — Telehealth: Payer: Self-pay | Admitting: Internal Medicine

## 2018-01-20 ENCOUNTER — Other Ambulatory Visit: Payer: Self-pay | Admitting: *Deleted

## 2018-01-20 MED ORDER — TELMISARTAN 80 MG PO TABS: 80.0000 mg | ORAL_TABLET | Freq: Every day | ORAL | 3 refills | Status: DC

## 2018-01-20 NOTE — Telephone Encounter (Signed)
Copied from [place] [id number]. Topic: Quick Communication - Rx Refill/Question >> Jan 20, 2018 12:21 PM Margot Ables wrote: Medication: telmisartan (MICARDIS) 80 MG tablet - pt states local pharmacies are out of stock. Please send the order from 8/6 90 day supply to [facility].

## 2018-01-20 NOTE — Telephone Encounter (Signed)
Call to patient- she reports she was only able to get 30 day supply in [place]- Rx sent to [facility] per patient request.

## 2018-01-24 DIAGNOSIS — M6281 Muscle weakness (generalized): Secondary | ICD-10-CM | POA: Diagnosis not present

## 2018-01-24 DIAGNOSIS — M25512 Pain in left shoulder: Secondary | ICD-10-CM | POA: Diagnosis not present

## 2018-01-24 DIAGNOSIS — M7542 Impingement syndrome of left shoulder: Secondary | ICD-10-CM | POA: Diagnosis not present

## 2018-02-03 DIAGNOSIS — M25512 Pain in left shoulder: Secondary | ICD-10-CM | POA: Diagnosis not present

## 2018-02-06 MED ORDER — TELMISARTAN 80 MG PO TABS: 80.0000 mg | ORAL_TABLET | Freq: Every day | ORAL | 3 refills | Status: DC

## 2018-02-06 NOTE — Telephone Encounter (Signed)
Patient is calling back to check on this and states [facility] has not received this prescription and she is just about out. Please advise.

## 2018-02-06 NOTE — Telephone Encounter (Signed)
[facility] per [facility]'s request.

## 2018-02-16 ENCOUNTER — Ambulatory Visit
Admission: RE | Admit: 2018-02-16 | Discharge: 2018-02-16 | Disposition: A | Discharge status: Home / Self Care | Payer: Medicare Other | Source: Ambulatory Visit | Attending: Internal Medicine | Admitting: Internal Medicine

## 2018-02-16 DIAGNOSIS — Z1231 Encounter for screening mammogram for malignant neoplasm of breast: Secondary | ICD-10-CM | POA: Diagnosis not present

## 2018-03-21 ENCOUNTER — Other Ambulatory Visit: Payer: Self-pay | Admitting: Internal Medicine

## 2018-04-17 ENCOUNTER — Ambulatory Visit (INDEPENDENT_AMBULATORY_CARE_PROVIDER_SITE_OTHER): Payer: Medicare Other | Admitting: Internal Medicine

## 2018-04-17 ENCOUNTER — Encounter: Payer: Self-pay | Admitting: Internal Medicine

## 2018-04-17 VITALS — BP 132/82 | HR 63 | Temp 97.7°F | Wt 209.4 lb

## 2018-04-17 DIAGNOSIS — M79671 Pain in right foot: Secondary | ICD-10-CM | POA: Diagnosis not present

## 2018-04-17 DIAGNOSIS — J4520 Mild intermittent asthma, uncomplicated: Secondary | ICD-10-CM | POA: Diagnosis not present

## 2018-04-17 DIAGNOSIS — Z79899 Other long term (current) drug therapy: Secondary | ICD-10-CM

## 2018-04-17 DIAGNOSIS — M722 Plantar fascial fibromatosis: Secondary | ICD-10-CM | POA: Diagnosis not present

## 2018-04-17 MED ORDER — FLUTICASONE-SALMETEROL 250-50 MCG/DOSE IN AEPB: 1.0000 | INHALATION_SPRAY | Freq: Two times a day (BID) | RESPIRATORY_TRACT | 6 refills | Status: DC

## 2018-04-17 NOTE — Progress Notes (Signed)
Chief Complaint  Patient presents with  . Foot Pain    x about 1 week. right heel. Pt states that heel started hurting one day and she does not recall injuring it.     HPI: Marie Kelly 72 y.o. come in for SDA    Has returned from 2 week trip to [facility] that went well  And did a lot of walking good shoes beach etc.   After return  A week/ noted   Right heel pain esp in am and when walking better in day . No injury    Remote hx of  Pf decades ago  rx local and injections.   No injury  Asks for generic advair for poss cost advantage ROS: See pertinent positives and negatives per HPI.  Past Medical History:  Diagnosis Date  . Anemia    pt. denies  . Arthritis   . Asthma   . 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 Colon polyps   . Complication of anesthesia    2 days after surgery-muscles hurtand spasmed-needed a muscle relaxant   . Diverticulosis   . Fracture of right olecranon process 10/26/2013  . GERD (gastroesophageal reflux disease)    had endo? taking nexium 3 x per week  . Hyperlipidemia    had been on lipitor changes to pravachol cause of cost and tricor has since stopped  NO  se reported   . Hypertension   . Hypothyroidism     on meds for years  . Impaired fasting glucose   . Olecranon fracture 10/25/2013   right arm  . PONV (postoperative nausea and vomiting)   . Ventricular bigeminy seen on cardiac monitor    Cardiac work up Dr Stanford Breed [facility]  . Wears dentures    top-partial bottom  . Wears glasses     Family History  Problem Relation Age of Onset  . Stroke Mother   . Diabetes Father   . Heart disease Father   . Alzheimer's disease Unknown   . Stomach cancer Maternal Aunt   . Colon cancer Neg Hx   . Pancreatic cancer Neg Hx     Social History   Socioeconomic History  . Marital status: Married    Spouse name: Not on file  . Number of children: 3  . Years of education: Not on file  . Highest  education level: Not on file  Occupational History  . Not on file  Social Needs  . Financial resource strain: Not on file  . Food insecurity:    Worry: Not on file    Inability: Not on file  . Transportation needs:    Medical: Not on file    Non-medical: Not on file  Tobacco Use  . Smoking status: Former Smoker    Last attempt to quit: 10/25/1973    Years since quitting: 44.5  . Smokeless tobacco: Never Used  Substance and Sexual Activity  . Alcohol use: Yes    Alcohol/week: 0.0 standard drinks    Comment: socially  . Drug use: No  . Sexual activity: Not on file  Lifestyle  . Physical activity:    Days per week: Not on file    Minutes per session: Not on file  . Stress: Not on file  Relationships  . Social connections:    Talks on phone: Not on file    Gets together: Not on file    Attends religious service: Not on  file    Active member of club or organization: Not on file    Attends meetings of clubs or organizations: Not on file    Relationship status: Not on file  Other Topics Concern  . 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    Gravida 3 para 3   Last td 6 2008   HS educated       REmote hx of tobacco 1.5 ppd for 6 years as a teen Q 68    Outpatient Medications Prior to Visit  Medication Sig Dispense Refill  . albuterol (PROVENTIL HFA;VENTOLIN HFA) 108 (90 Base) MCG/ACT inhaler Inhale 2 puffs into the lungs every 6 (six) hours as needed for wheezing or shortness of breath. 1 Inhaler 1  . bimatoprost (LUMIGAN) 0.01 % SOLN Place 1 drop into both eyes at bedtime.    . carvedilol (COREG) 6.25 MG tablet 6.25 in am and 12.5 mg in pm 90 tablet 3  . levothyroxine (SYNTHROID, LEVOTHROID) 50 MCG tablet Take 1 tablet (50 mcg total) by mouth daily. 90 tablet 3  . meloxicam (MOBIC) 15 MG tablet Take 15 mg by  mouth daily as needed for pain.    . montelukast (SINGULAIR) 10 MG tablet Take 1 tablet (10 mg total) by mouth at bedtime. 90 tablet 3  . telmisartan (MICARDIS) 80 MG tablet Take 1 tablet (80 mg total) by mouth daily. 90 tablet 3  . tolterodine (DETROL LA) 2 MG 24 hr capsule Take 1 capsule (2 mg total) by mouth daily. 90 capsule 3  . Fluticasone-Salmeterol (ADVAIR) 250-50 MCG/DOSE AEPB Inhale 1 puff into the lungs 2 (two) times daily. 1 each 6  . carvedilol (COREG) 6.25 MG tablet TAKE 1 TABLET (6.25 MG)  BY MOUTH IN THE MORNING AND 2 TABLETS (12.5MG ) IN THE EVENING. (Patient not taking: Reported on 04/17/2018) 90 tablet 5   No facility-administered medications prior to visit.      EXAM:  BP 132/82 (BP Location: Right Arm, Patient Position: Sitting, Cuff Size: Normal)   Pulse 63   Temp 97.7 F (36.5 C) (Oral)   Wt 209 lb 6.4 oz (95 kg)   BMI 35.94 kg/m   Body mass index is 35.94 kg/m.  GENERAL: vitals reviewed and listed above, alert, oriented, appears well hydrated and in no acute distress HEENT: atraumatic, conjunctiva  clear, no obvious abnormalities on inspection of external nose and ears MS: moves all extremities without noticeable focal  Abnormality  r   Foot nl inspection   Tender at mid calcaleal and  At fascial attachment   Neg squeeze  Gait min antalgic   nv is grossly nl. PSYCH: pleasant and cooperative, no obvious depression or anxiety  BP Readings from Last 3 Encounters:  04/17/18 132/82  12/20/17 112/72  06/29/17 126/80    ASSESSMENT AND PLAN:  Discussed the following assessment and plan:  Pain of right heel  Plantar fasciitis  Medication management  Intermittent asthma without complication, unspecified asthma severity   Expectant management. And plan    Conservative care  Disc  And referral or  Seek other care if   persistent or progressive   physical modalities may work the best.   Madaline Brilliant to try the generic advair -Patient advised to return  or notify health  care team  if  new concerns arise.  Patient Instructions  This seems to be plantar fasciitis  This is an overuse injury   Ice  In the evening   And stretch   In am   and  No bare foot   Padded  Or heel inserts.   If not improving in  3-4 weeks can see  Sports medicine    Ortho specilasit     Plantar Fasciitis Plantar fasciitis is a painful foot condition that affects the heel. It occurs when the band of tissue that connects the toes to the heel bone (plantar fascia) becomes irritated. This can happen after exercising too much or doing other repetitive activities (overuse injury). The pain from plantar fasciitis can range from mild irritation to severe pain that makes it difficult for you to walk or move. The pain is usually worse in the morning or after you have been sitting or lying down for a while. What are the causes? This condition may be caused by:  Standing for long periods of time.  Wearing shoes that do not fit.  Doing high-impact activities, including running, aerobics, and ballet.  Being overweight.  Having an abnormal way of walking (gait).  Having tight calf muscles.  Having high arches in your feet.  Starting a new athletic activity.  What are the signs or symptoms? The main symptom of this condition is heel pain. Other symptoms include:  Pain that gets worse after activity or exercise.  Pain that is worse in the morning or after resting.  Pain that goes away after you walk for a few minutes.  How is this diagnosed? This condition may be diagnosed based on your signs and symptoms. Your health care provider will also do a physical exam to check for:  A tender area on the bottom of your foot.  A high arch in your foot.  Pain when you move your foot.  Difficulty moving your foot.  You may also need to have imaging studies to confirm the diagnosis. These can include:  X-rays.  Ultrasound.  MRI.  How is this treated? Treatment for plantar  fasciitis depends on the severity of the condition. Your treatment may include:  Rest, ice, and over-the-counter pain medicines to manage your pain.  Exercises to stretch your calves and your plantar fascia.  A splint that holds your foot in a stretched, upward position while you sleep (night splint).  Physical therapy to relieve symptoms and prevent problems in the future.  Cortisone injections to relieve severe pain.  Extracorporeal shock wave therapy (ESWT) to stimulate damaged plantar fascia with electrical impulses. It is often used as a last resort before surgery.  Surgery, if other treatments have not worked after 12 months.  Follow these instructions at home:  Take medicines only as directed by your health care provider.  Avoid activities that cause pain.  Roll the bottom of your foot over a bag of ice or a bottle of cold water. Do this for 20 minutes, 3-4 times a day.  Perform simple stretches as directed by your health care provider.  Try wearing athletic shoes with air-sole or gel-sole cushions or soft shoe inserts.  Wear a night splint while sleeping, if directed by your health care provider.  Keep all follow-up appointments with your health care provider. How is this prevented?  Do not perform exercises or activities that cause heel pain.  Consider finding low-impact activities if you continue to have problems.  Lose weight if you need to. The best way to prevent plantar fasciitis is to avoid the activities that aggravate your plantar fascia. Contact a health care provider if:  Your symptoms do not go away after treatment with home care measures.  Your pain gets worse.  Your pain affects your ability to move or do your daily activities. This information is not intended to replace advice given to you by your health care provider. Make sure you discuss any questions you have with your health care provider. Document Released: 01/26/2001 Document Revised:  10/06/2015 Document Reviewed: 03/13/2014 Elsevier Interactive Patient Education  2018 [place]. Lynessa Almanzar M.D.

## 2018-04-17 NOTE — Patient Instructions (Signed)
This seems to be plantar fasciitis  This is an overuse injury   Ice  In the evening   And stretch   In am   and  No bare foot   Padded  Or heel inserts.   If not improving in  3-4 weeks can see  Sports medicine    Ortho specilasit     Plantar Fasciitis Plantar fasciitis is a painful foot condition that affects the heel. It occurs when the band of tissue that connects the toes to the heel bone (plantar fascia) becomes irritated. This can happen after exercising too much or doing other repetitive activities (overuse injury). The pain from plantar fasciitis can range from mild irritation to severe pain that makes it difficult for you to walk or move. The pain is usually worse in the morning or after you have been sitting or lying down for a while. What are the causes? This condition may be caused by:  Standing for long periods of time.  Wearing shoes that do not fit.  Doing high-impact activities, including running, aerobics, and ballet.  Being overweight.  Having an abnormal way of walking (gait).  Having tight calf muscles.  Having high arches in your feet.  Starting a new athletic activity.  What are the signs or symptoms? The main symptom of this condition is heel pain. Other symptoms include:  Pain that gets worse after activity or exercise.  Pain that is worse in the morning or after resting.  Pain that goes away after you walk for a few minutes.  How is this diagnosed? This condition may be diagnosed based on your signs and symptoms. Your health care provider will also do a physical exam to check for:  A tender area on the bottom of your foot.  A high arch in your foot.  Pain when you move your foot.  Difficulty moving your foot.  You may also need to have imaging studies to confirm the diagnosis. These can include:  X-rays.  Ultrasound.  MRI.  How is this treated? Treatment for plantar fasciitis depends on the severity of the condition. Your treatment  may include:  Rest, ice, and over-the-counter pain medicines to manage your pain.  Exercises to stretch your calves and your plantar fascia.  A splint that holds your foot in a stretched, upward position while you sleep (night splint).  Physical therapy to relieve symptoms and prevent problems in the future.  Cortisone injections to relieve severe pain.  Extracorporeal shock wave therapy (ESWT) to stimulate damaged plantar fascia with electrical impulses. It is often used as a last resort before surgery.  Surgery, if other treatments have not worked after 12 months.  Follow these instructions at home:  Take medicines only as directed by your health care provider.  Avoid activities that cause pain.  Roll the bottom of your foot over a bag of ice or a bottle of cold water. Do this for 20 minutes, 3-4 times a day.  Perform simple stretches as directed by your health care provider.  Try wearing athletic shoes with air-sole or gel-sole cushions or soft shoe inserts.  Wear a night splint while sleeping, if directed by your health care provider.  Keep all follow-up appointments with your health care provider. How is this prevented?  Do not perform exercises or activities that cause heel pain.  Consider finding low-impact activities if you continue to have problems.  Lose weight if you need to. The best way to prevent plantar fasciitis is to  avoid the activities that aggravate your plantar fascia. Contact a health care provider if:  Your symptoms do not go away after treatment with home care measures.  Your pain gets worse.  Your pain affects your ability to move or do your daily activities. This information is not intended to replace advice given to you by your health care provider. Make sure you discuss any questions you have with your health care provider. Document Released: 01/26/2001 Document Revised: 10/06/2015 Document Reviewed: 03/13/2014 Elsevier Interactive Patient  Education  [employer].

## 2018-05-09 NOTE — Telephone Encounter (Signed)
Don't  have a specific  Person  But   consider weight watchers ( most men prefer on line)    Or if eligible his  PCP can refer  To  [facility]  Weight loss management     Dr Redgie Grayer et al but that is a commitment to  Follow up.

## 2018-06-29 ENCOUNTER — Encounter: Payer: Self-pay | Admitting: Podiatry

## 2018-06-29 ENCOUNTER — Ambulatory Visit (INDEPENDENT_AMBULATORY_CARE_PROVIDER_SITE_OTHER): Payer: Medicare Other

## 2018-06-29 ENCOUNTER — Ambulatory Visit (INDEPENDENT_AMBULATORY_CARE_PROVIDER_SITE_OTHER): Payer: Medicare Other | Admitting: Podiatry

## 2018-06-29 ENCOUNTER — Other Ambulatory Visit: Payer: Self-pay | Admitting: Podiatry

## 2018-06-29 VITALS — BP 117/87 | HR 56 | Resp 16

## 2018-06-29 DIAGNOSIS — M722 Plantar fascial fibromatosis: Secondary | ICD-10-CM

## 2018-06-29 DIAGNOSIS — M79671 Pain in right foot: Secondary | ICD-10-CM | POA: Diagnosis not present

## 2018-06-29 MED ORDER — TRIAMCINOLONE ACETONIDE 10 MG/ML IJ SUSP
10.0000 mg | Freq: Once | INTRAMUSCULAR | Status: AC
Start: 2018-06-29 — End: 2018-06-29
  Administered 2018-06-29: 10 mg

## 2018-06-29 NOTE — Progress Notes (Signed)
   Subjective:    Patient ID: Marie Kelly, female    DOB: 01-15-1946, 73 y.o.   MRN: [id number]  HPI    Review of Systems  All other systems reviewed and are negative.      Objective:   Physical Exam        Assessment & Plan:

## 2018-06-29 NOTE — Progress Notes (Signed)
DNOTE Subjective:   Patient ID: Marie Kelly, female   DOB: 73 y.o.   MRN: [id number]   HPI Patient states of having a lot of pain in the bottom my right heel and it started about 2 months ago and it happened after I was on it quite a bit for the holidays.  Patient does not smoke likes to be active   Review of Systems  All other systems reviewed and are negative.       Objective:  Physical Exam Vitals signs and nursing note reviewed.  Constitutional:      Appearance: She is well-developed.  Pulmonary:     Effort: Pulmonary effort is normal.  Musculoskeletal: Normal range of motion.  Skin:    General: Skin is warm.  Neurological:     Mental Status: She is alert.     Neurovascular status intact muscle strength is adequate exquisite discomfort plantar aspect right heel at the insertional point of the tendon into the calcaneus with fluid buildup noted.  Patient's found have good digital perfusion well oriented x3     Assessment:  Acute plantar fasciitis right with inflammation fluid of the medial band     Plan:  H&P condition reviewed and sterile prep applied and did careful injection of the fascia 3 mg Kenalog 5 mg Xylocaine and applied fascial brace with instructions for physical therapy and shoe gear modifications.  Patient will be seen back to recheck 2 weeks or earlier if needed  X-rays indicate small spur with no indication to stress fracture arthritis

## 2018-06-29 NOTE — Patient Instructions (Signed)

## 2018-07-05 DIAGNOSIS — Z961 Presence of intraocular lens: Secondary | ICD-10-CM | POA: Diagnosis not present

## 2018-07-05 DIAGNOSIS — H401233 Low-tension glaucoma, bilateral, severe stage: Secondary | ICD-10-CM | POA: Diagnosis not present

## 2018-07-05 DIAGNOSIS — H5340 Unspecified visual field defects: Secondary | ICD-10-CM | POA: Diagnosis not present

## 2018-07-13 ENCOUNTER — Ambulatory Visit: Payer: Medicare Other | Admitting: Podiatry

## 2018-07-14 ENCOUNTER — Encounter: Payer: Self-pay | Admitting: Podiatry

## 2018-07-14 ENCOUNTER — Ambulatory Visit (INDEPENDENT_AMBULATORY_CARE_PROVIDER_SITE_OTHER): Payer: Medicare Other | Admitting: Podiatry

## 2018-07-14 DIAGNOSIS — M722 Plantar fascial fibromatosis: Secondary | ICD-10-CM | POA: Diagnosis not present

## 2018-07-15 NOTE — Progress Notes (Signed)
Subjective:   Patient ID: Marie Kelly, female   DOB: 73 y.o.   MRN: [id number]   HPI Patient states overall doing well with minimal discomfort   ROS      Objective:  Physical Exam  Neurovascular status intact muscle strength is adequate patient found to have significant reduction of discomfort in the plantar heel with pain fully upon deep palpation     Assessment:  Significant improvement of plantar fascia from previous treatment     Plan:  H&P condition reviewed and recommended continued conservative care supportive shoe gear usage and stretching exercises.  If symptoms come back she will be seen back

## 2018-07-17 DIAGNOSIS — H35371 Puckering of macula, right eye: Secondary | ICD-10-CM | POA: Diagnosis not present

## 2018-07-17 DIAGNOSIS — H353131 Nonexudative age-related macular degeneration, bilateral, early dry stage: Secondary | ICD-10-CM | POA: Diagnosis not present

## 2018-07-17 DIAGNOSIS — H4423 Degenerative myopia, bilateral: Secondary | ICD-10-CM | POA: Diagnosis not present

## 2018-07-17 DIAGNOSIS — H33101 Unspecified retinoschisis, right eye: Secondary | ICD-10-CM | POA: Diagnosis not present

## 2018-08-02 DIAGNOSIS — H5310 Unspecified subjective visual disturbances: Secondary | ICD-10-CM | POA: Diagnosis not present

## 2018-08-02 DIAGNOSIS — H524 Presbyopia: Secondary | ICD-10-CM | POA: Diagnosis not present

## 2018-08-02 DIAGNOSIS — H35371 Puckering of macula, right eye: Secondary | ICD-10-CM | POA: Diagnosis not present

## 2018-08-02 DIAGNOSIS — H43813 Vitreous degeneration, bilateral: Secondary | ICD-10-CM | POA: Diagnosis not present

## 2018-08-07 NOTE — Telephone Encounter (Signed)
Would advise  Tele    Or at least phone visit to evaluated for rx .  In lieu of temporary  ban on   Office visits for resp complaints.

## 2018-08-08 ENCOUNTER — Other Ambulatory Visit: Payer: Self-pay

## 2018-08-08 ENCOUNTER — Ambulatory Visit (INDEPENDENT_AMBULATORY_CARE_PROVIDER_SITE_OTHER): Payer: Medicare Other | Admitting: Internal Medicine

## 2018-08-08 DIAGNOSIS — J4520 Mild intermittent asthma, uncomplicated: Secondary | ICD-10-CM

## 2018-08-08 DIAGNOSIS — J019 Acute sinusitis, unspecified: Secondary | ICD-10-CM | POA: Diagnosis not present

## 2018-08-08 MED ORDER — AMOXICILLIN-POT CLAVULANATE 875-125 MG PO TABS: 1.0000 | ORAL_TABLET | Freq: Two times a day (BID) | ORAL | 0 refills | Status: DC

## 2018-08-08 NOTE — Progress Notes (Signed)
Virtual Visit via Video Note  I connected with@ on 08/09/18 at  1:30 PM EDT by a video enabled telemedicine application and verified that I am speaking with the correct person   Location patient: home Location provider:workoffice Persons participating in the virtual visit: patient, provider  Patient aware  of evaluation and management by telemedicine and risk and availability of in person appointments.    HPI: Marie Kelly has had about 10 days of respiratory symptoms that she considers a sinus infection She has underlying allergy and is using her Advair Flonase.  Onset with a little bit of a sore throat then is developed cough and sinus facial pressure with bilateral nasal congestion.  She states that the discharge is thick and nasty looking but no blood.  She does have coughing fits but no chest pain or shortness of breath except from soreness of coughing. At some point she had a temperature of 99.9. She is using over-the-counter Synex medicine no vomiting but had some nausea this weekend. She states that her temperature is 99 and her blood pressure is 126/81. Lives at home with her husband who is not sick. There is been no travel in the recent past or high risk exposures. Does not seem like an asthma flare. techinal problem with  Video  For seeing her  But audio  And she could se eprovider   ROS: See pertinent positives and negatives per HPI.  Past Medical History:  Diagnosis Date  . Anemia    pt. denies  . Arthritis   . Asthma   . 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 Colon polyps   . Complication of anesthesia    2 days after surgery-muscles hurtand spasmed-needed a muscle relaxant   . Diverticulosis   . Fracture of right olecranon process 10/26/2013  . GERD (gastroesophageal reflux disease)    had endo? taking nexium 3 x per week  . Hyperlipidemia    had been on lipitor changes to pravachol cause of cost and tricor has since  stopped  NO  se reported   . Hypertension   . Hypothyroidism     on meds for years  . Impaired fasting glucose   . Olecranon fracture 10/25/2013   right arm  . PONV (postoperative nausea and vomiting)   . Ventricular bigeminy seen on cardiac monitor    Cardiac work up Dr Stanford Breed [facility]  . Wears dentures    top-partial bottom  . Wears glasses     Past Surgical History:  Procedure Laterality Date  . BELPHAROPTOSIS REPAIR Bilateral   . CATARACT EXTRACTION Bilateral 11/2016  . CHOLECYSTECTOMY    . COLONOSCOPY    . HARVEST BONE GRAFT Right 04/19/2014   Procedure: HARVEST LEFT TIBIA BONE GRAFT ;  Surgeon: Johnny Bridge, MD;  Location: [place];  Service: Orthopedics;  Laterality: Right;  . HEMORROIDECTOMY    . KNEE ARTHROSCOPY Right   . NASAL SINUS SURGERY    . ORIF ELBOW FRACTURE Right 10/26/2013   Procedure: RIGHT ELBOW FRACTUE OPEN TREATMENT ULNAR PROXIMAL END OLECRANON PROCESS INCLUDES INTERNAL FIXATION;  Surgeon: Johnny Bridge, MD;  Location: [place];  Service: Orthopedics;  Laterality: Right;  . ORIF ELBOW FRACTURE Right 04/19/2014   Procedure: OPEN REDUCTION INTERNAL FIXATION (ORIF) RIGHT ELBOW/OLECRANON NONUNION FRACTURE WITH HARDWARE REMOVAL;  Surgeon: Johnny Bridge, MD;  Location: [place];  Service: Orthopedics;  Laterality: Right;  . TONSILLECTOMY    .  TUBAL LIGATION    . uterus repair      Family History  Problem Relation Age of Onset  . Stroke Mother   . Diabetes Father   . Heart disease Father   . Alzheimer's disease Other   . Stomach cancer Maternal Aunt   . Colon cancer Neg Hx   . Pancreatic cancer Neg Hx        Current Outpatient Medications:  .  albuterol (PROVENTIL HFA;VENTOLIN HFA) 108 (90 Base) MCG/ACT inhaler, Inhale 2 puffs into the lungs every 6 (six) hours as needed for wheezing or shortness of breath., Disp: 1 Inhaler, Rfl: 1 .  amoxicillin-clavulanate (AUGMENTIN) 875-125 MG tablet,  Take 1 tablet by mouth every 12 (twelve) hours. For sinusitis, Disp: 14 tablet, Rfl: 0 .  bimatoprost (LUMIGAN) 0.01 % SOLN, Place 1 drop into both eyes at bedtime., Disp: , Rfl:  .  carvedilol (COREG) 6.25 MG tablet, 6.25 in am and 12.5 mg in pm, Disp: 90 tablet, Rfl: 3 .  Fluticasone-Salmeterol (ADVAIR) 250-50 MCG/DOSE AEPB, Inhale 1 puff into the lungs 2 (two) times daily. GENERIC ONLY, Disp: 1 each, Rfl: 6 .  levothyroxine (SYNTHROID, LEVOTHROID) 50 MCG tablet, Take 1 tablet (50 mcg total) by mouth daily., Disp: 90 tablet, Rfl: 3 .  meloxicam (MOBIC) 15 MG tablet, Take 15 mg by mouth daily as needed for pain., Disp: , Rfl:  .  montelukast (SINGULAIR) 10 MG tablet, Take 1 tablet (10 mg total) by mouth at bedtime., Disp: 90 tablet, Rfl: 3 .  telmisartan (MICARDIS) 80 MG tablet, Take 1 tablet (80 mg total) by mouth daily., Disp: 90 tablet, Rfl: 3 .  tolterodine (DETROL LA) 2 MG 24 hr capsule, Take 1 capsule (2 mg total) by mouth daily., Disp: 90 capsule, Rfl: 3 .  valACYclovir (VALTREX) 1000 MG tablet, , Disp: , Rfl:   EXAM:  VITALS per patient if applicable: Temperature 99 blood pressure 126/81.  GENERAL: alert, oriented,seems well and in no acute distress  Video part of the session was only partly working.one way   But no respiratory distress coughing during visit or seemingly ill appearance.  PSYCH/NEURO: pleasant and cooperative, no obvious depression or anxiety, speech and thought processing grossly intact  ASSESSMENT AND PLAN:  Discussed the following assessment and plan:  Acute sinusitis, recurrence not specified, unspecified location  Intermittent asthma without complication, unspecified asthma severity Hx of asthma   I discussed the assessment and treatment plan with the patient.  The patient agreed with the plan and demonstrated an understanding of the instructions.  She is not allergic to antibiotics and has not been on one for quite a while. She has underlying asthma  allergy but does not appear to be flaring at this time she will remain on her Advair add antibiotic for sinusitis bacterial Augmentin 875 twice a day for 7 days sent into her local pharmacy [facility]. Symptomatic treatment if relapsing not improved may need an in person office visit or certainly recheck. Expectant management discussed.   The patient was advised to call back or seek an in-person evaluation if the symptoms worsen or if the condition fails to improve as anticipated.  I provided  15 minutes of non and -face-to-face time during this encounter.   Shanon Ace, MD

## 2018-08-08 NOTE — Patient Instructions (Signed)
See Patient instructions

## 2018-08-09 ENCOUNTER — Encounter: Payer: Self-pay | Admitting: Internal Medicine

## 2018-09-18 DIAGNOSIS — R293 Abnormal posture: Secondary | ICD-10-CM | POA: Diagnosis not present

## 2018-09-18 DIAGNOSIS — M9903 Segmental and somatic dysfunction of lumbar region: Secondary | ICD-10-CM | POA: Diagnosis not present

## 2018-09-18 DIAGNOSIS — M2560 Stiffness of unspecified joint, not elsewhere classified: Secondary | ICD-10-CM | POA: Diagnosis not present

## 2018-09-18 DIAGNOSIS — M545 Low back pain: Secondary | ICD-10-CM | POA: Diagnosis not present

## 2018-09-20 DIAGNOSIS — M9903 Segmental and somatic dysfunction of lumbar region: Secondary | ICD-10-CM | POA: Diagnosis not present

## 2018-09-20 DIAGNOSIS — R293 Abnormal posture: Secondary | ICD-10-CM | POA: Diagnosis not present

## 2018-09-20 DIAGNOSIS — M545 Low back pain: Secondary | ICD-10-CM | POA: Diagnosis not present

## 2018-09-20 DIAGNOSIS — M2560 Stiffness of unspecified joint, not elsewhere classified: Secondary | ICD-10-CM | POA: Diagnosis not present

## 2018-09-26 DIAGNOSIS — M9903 Segmental and somatic dysfunction of lumbar region: Secondary | ICD-10-CM | POA: Diagnosis not present

## 2018-09-26 DIAGNOSIS — M545 Low back pain: Secondary | ICD-10-CM | POA: Diagnosis not present

## 2018-09-27 DIAGNOSIS — M545 Low back pain: Secondary | ICD-10-CM | POA: Diagnosis not present

## 2018-09-27 DIAGNOSIS — M9903 Segmental and somatic dysfunction of lumbar region: Secondary | ICD-10-CM | POA: Diagnosis not present

## 2018-09-29 DIAGNOSIS — M9903 Segmental and somatic dysfunction of lumbar region: Secondary | ICD-10-CM | POA: Diagnosis not present

## 2018-09-29 DIAGNOSIS — M545 Low back pain: Secondary | ICD-10-CM | POA: Diagnosis not present

## 2018-10-02 DIAGNOSIS — M545 Low back pain: Secondary | ICD-10-CM | POA: Diagnosis not present

## 2018-10-02 DIAGNOSIS — M9903 Segmental and somatic dysfunction of lumbar region: Secondary | ICD-10-CM | POA: Diagnosis not present

## 2018-10-03 DIAGNOSIS — L304 Erythema intertrigo: Secondary | ICD-10-CM | POA: Diagnosis not present

## 2018-10-03 DIAGNOSIS — D485 Neoplasm of uncertain behavior of skin: Secondary | ICD-10-CM | POA: Diagnosis not present

## 2018-10-03 DIAGNOSIS — L570 Actinic keratosis: Secondary | ICD-10-CM | POA: Diagnosis not present

## 2018-10-03 DIAGNOSIS — L821 Other seborrheic keratosis: Secondary | ICD-10-CM | POA: Diagnosis not present

## 2018-10-03 DIAGNOSIS — B0089 Other herpesviral infection: Secondary | ICD-10-CM | POA: Diagnosis not present

## 2018-10-03 DIAGNOSIS — D225 Melanocytic nevi of trunk: Secondary | ICD-10-CM | POA: Diagnosis not present

## 2018-10-03 DIAGNOSIS — D1801 Hemangioma of skin and subcutaneous tissue: Secondary | ICD-10-CM | POA: Diagnosis not present

## 2018-10-04 DIAGNOSIS — M545 Low back pain: Secondary | ICD-10-CM | POA: Diagnosis not present

## 2018-10-04 DIAGNOSIS — M9903 Segmental and somatic dysfunction of lumbar region: Secondary | ICD-10-CM | POA: Diagnosis not present

## 2018-10-05 DIAGNOSIS — H26492 Other secondary cataract, left eye: Secondary | ICD-10-CM | POA: Diagnosis not present

## 2018-10-05 DIAGNOSIS — Z961 Presence of intraocular lens: Secondary | ICD-10-CM | POA: Diagnosis not present

## 2018-10-05 DIAGNOSIS — H401212 Low-tension glaucoma, right eye, moderate stage: Secondary | ICD-10-CM | POA: Diagnosis not present

## 2018-10-06 DIAGNOSIS — M9903 Segmental and somatic dysfunction of lumbar region: Secondary | ICD-10-CM | POA: Diagnosis not present

## 2018-10-06 DIAGNOSIS — M545 Low back pain: Secondary | ICD-10-CM | POA: Diagnosis not present

## 2018-10-08 ENCOUNTER — Other Ambulatory Visit: Payer: Self-pay | Admitting: Internal Medicine

## 2018-10-09 DIAGNOSIS — M9903 Segmental and somatic dysfunction of lumbar region: Secondary | ICD-10-CM | POA: Diagnosis not present

## 2018-10-09 DIAGNOSIS — M545 Low back pain: Secondary | ICD-10-CM | POA: Diagnosis not present

## 2018-10-11 DIAGNOSIS — M9903 Segmental and somatic dysfunction of lumbar region: Secondary | ICD-10-CM | POA: Diagnosis not present

## 2018-10-11 DIAGNOSIS — M545 Low back pain: Secondary | ICD-10-CM | POA: Diagnosis not present

## 2018-10-13 DIAGNOSIS — M9903 Segmental and somatic dysfunction of lumbar region: Secondary | ICD-10-CM | POA: Diagnosis not present

## 2018-10-13 DIAGNOSIS — M545 Low back pain: Secondary | ICD-10-CM | POA: Diagnosis not present

## 2018-10-16 DIAGNOSIS — M545 Low back pain: Secondary | ICD-10-CM | POA: Diagnosis not present

## 2018-10-16 DIAGNOSIS — M9903 Segmental and somatic dysfunction of lumbar region: Secondary | ICD-10-CM | POA: Diagnosis not present

## 2018-10-18 DIAGNOSIS — M9903 Segmental and somatic dysfunction of lumbar region: Secondary | ICD-10-CM | POA: Diagnosis not present

## 2018-10-18 DIAGNOSIS — M545 Low back pain: Secondary | ICD-10-CM | POA: Diagnosis not present

## 2018-10-20 DIAGNOSIS — M9903 Segmental and somatic dysfunction of lumbar region: Secondary | ICD-10-CM | POA: Diagnosis not present

## 2018-10-20 DIAGNOSIS — M545 Low back pain: Secondary | ICD-10-CM | POA: Diagnosis not present

## 2018-10-23 DIAGNOSIS — M545 Low back pain: Secondary | ICD-10-CM | POA: Diagnosis not present

## 2018-10-23 DIAGNOSIS — M9903 Segmental and somatic dysfunction of lumbar region: Secondary | ICD-10-CM | POA: Diagnosis not present

## 2018-10-24 DIAGNOSIS — L570 Actinic keratosis: Secondary | ICD-10-CM | POA: Diagnosis not present

## 2018-10-25 DIAGNOSIS — M9903 Segmental and somatic dysfunction of lumbar region: Secondary | ICD-10-CM | POA: Diagnosis not present

## 2018-10-25 DIAGNOSIS — M545 Low back pain: Secondary | ICD-10-CM | POA: Diagnosis not present

## 2018-11-01 ENCOUNTER — Other Ambulatory Visit: Payer: Self-pay

## 2018-11-01 ENCOUNTER — Ambulatory Visit (INDEPENDENT_AMBULATORY_CARE_PROVIDER_SITE_OTHER): Payer: Medicare Other | Admitting: Internal Medicine

## 2018-11-01 ENCOUNTER — Encounter: Payer: Self-pay | Admitting: Internal Medicine

## 2018-11-01 DIAGNOSIS — L509 Urticaria, unspecified: Secondary | ICD-10-CM | POA: Diagnosis not present

## 2018-11-01 DIAGNOSIS — L298 Other pruritus: Secondary | ICD-10-CM

## 2018-11-01 NOTE — Progress Notes (Signed)
Virtual Visit via Video Note  I connected with@ on 11/01/18 at 10:45 AM EDT by a video enabled telemedicine application and verified that I am speaking with the correct person using two identifiers. Location patient: home Location provider:work  office Persons participating in the virtual visit: patient, provider  [place] national recommendations  regarding COVID 19 pandemic   video visit is advised over in office visit for this patient.  Patient aware  of the limitations of evaluation and management by telemedicine and  availability of in person appointments. and agreed to proceed.   HPI: Marie Kelly presents for video visit   Had onset of itchy bumps like bug bites all over  recnetly  And then today hives on neck  Taking singulair andt took benadryl x  1 day ? If helped .   New eye drops   End of May  And was ok on test does but up to tis   Wondered  Ir itching from new drops  But not related to eye  No angioedema gi gu resp sx with this No hx of hives  She stopped the ey drops x 1 day  ROS: See pertinent positives and negatives per HPI.  Past Medical History:  Diagnosis Date  . Anemia    pt. denies  . Arthritis   . Asthma   . 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 Colon polyps   . Complication of anesthesia    2 days after surgery-muscles hurtand spasmed-needed a muscle relaxant   . Diverticulosis   . Fracture of right olecranon process 10/26/2013  . GERD (gastroesophageal reflux disease)    had endo? taking nexium 3 x per week  . Hyperlipidemia    had been on lipitor changes to pravachol cause of cost and tricor has since stopped  NO  se reported   . Hypertension   . Hypothyroidism     on meds for years  . Impaired fasting glucose   . Olecranon fracture 10/25/2013   right arm  . PONV (postoperative nausea and vomiting)   . Ventricular bigeminy seen on cardiac monitor    Cardiac work up Dr Stanford Breed [facility]  . Wears dentures     top-partial bottom  . Wears glasses     Past Surgical History:  Procedure Laterality Date  . BELPHAROPTOSIS REPAIR Bilateral   . CATARACT EXTRACTION Bilateral 11/2016  . CHOLECYSTECTOMY    . COLONOSCOPY    . HARVEST BONE GRAFT Right 04/19/2014   Procedure: HARVEST LEFT TIBIA BONE GRAFT ;  Surgeon: Johnny Bridge, MD;  Location: [facility];  Service: Orthopedics;  Laterality: Right;  . HEMORROIDECTOMY    . KNEE ARTHROSCOPY Right   . NASAL SINUS SURGERY    . ORIF ELBOW FRACTURE Right 10/26/2013   Procedure: RIGHT ELBOW FRACTUE OPEN TREATMENT ULNAR PROXIMAL END OLECRANON PROCESS INCLUDES INTERNAL FIXATION;  Surgeon: Johnny Bridge, MD;  Location: [facility];  Service: Orthopedics;  Laterality: Right;  . ORIF ELBOW FRACTURE Right 04/19/2014   Procedure: OPEN REDUCTION INTERNAL FIXATION (ORIF) RIGHT ELBOW/OLECRANON NONUNION FRACTURE WITH HARDWARE REMOVAL;  Surgeon: Johnny Bridge, MD;  Location: [facility];  Service: Orthopedics;  Laterality: Right;  . TONSILLECTOMY    . TUBAL LIGATION    . uterus repair      Family History  Problem Relation Age of Onset  . Stroke Mother   . Diabetes Father   . Heart disease  Father   . Alzheimer's disease Other   . Stomach cancer Maternal Aunt   . Colon cancer Neg Hx   . Pancreatic cancer Neg Hx     Social History   Tobacco Use  . Smoking status: Former Smoker    Quit date: 10/25/1973    Years since quitting: 45.0  . Smokeless tobacco: Never Used  Substance Use Topics  . Alcohol use: Yes    Alcohol/week: 0.0 standard drinks    Comment: socially  . Drug use: No      Current Outpatient Medications:  .  albuterol (PROVENTIL HFA;VENTOLIN HFA) 108 (90 Base) MCG/ACT inhaler, Inhale 2 puffs into the lungs every 6 (six) hours as needed for wheezing or shortness of breath., Disp: 1 Inhaler, Rfl: 1 .  amoxicillin-clavulanate (AUGMENTIN) 875-125 MG tablet, Take 1 tablet by mouth every 12 (twelve)  hours. For sinusitis, Disp: 14 tablet, Rfl: 0 .  bimatoprost (LUMIGAN) 0.01 % SOLN, Place 1 drop into both eyes at bedtime., Disp: , Rfl:  .  carvedilol (COREG) 6.25 MG tablet, TAKE 1 TABLET (6.25 MG)  BY MOUTH IN THE MORNING AND 2 TABLETS (12.5MG ) IN THE EVENING., Disp: 90 tablet, Rfl: 4 .  Fluticasone-Salmeterol (ADVAIR) 250-50 MCG/DOSE AEPB, Inhale 1 puff into the lungs 2 (two) times daily. GENERIC ONLY, Disp: 1 each, Rfl: 6 .  levothyroxine (SYNTHROID, LEVOTHROID) 50 MCG tablet, Take 1 tablet (50 mcg total) by mouth daily., Disp: 90 tablet, Rfl: 3 .  meloxicam (MOBIC) 15 MG tablet, Take 15 mg by mouth daily as needed for pain., Disp: , Rfl:  .  montelukast (SINGULAIR) 10 MG tablet, Take 1 tablet (10 mg total) by mouth at bedtime., Disp: 90 tablet, Rfl: 3 .  telmisartan (MICARDIS) 80 MG tablet, Take 1 tablet (80 mg total) by mouth daily., Disp: 90 tablet, Rfl: 3 .  tolterodine (DETROL LA) 2 MG 24 hr capsule, Take 1 capsule (2 mg total) by mouth daily., Disp: 90 capsule, Rfl: 3 .  valACYclovir (VALTREX) 1000 MG tablet, , Disp: , Rfl:   EXAM: BP Readings from Last 3 Encounters:  06/29/18 117/87  04/17/18 132/82  12/20/17 112/72    VITALS per patient if applicable: Skin  Neck looks  Faintly pink at placed but no swelling and rash not eaasily visible at this time    Right upper arm with pink bump like a bug bite with swelling ? Papular hive?  Small about 1 cm  GENERAL: alert, oriented, appears well and in no acute distress  HEENT: atraumatic, conjunttiva clear, no obvious abnormalities on inspection of external nose and ears eye looks clear and no edema   NECK: normal movements of the head and neck  LUNGS: on inspection no signs of respiratory distress, breathing rate appears normal, no obvious gross SOB, gasping or wheezing  CV: no obvious cyanosis  MS: moves all visible extremities without noticeable abnormality  PSYCH/NEURO: pleasant and cooperative, no obvious depression or  anxiety, speech and thought processing grossly intact Lab Results  Component Value Date   WBC 3.6 (L) 12/20/2017   HGB 12.9 12/20/2017   HCT 38.4 12/20/2017   PLT 245.0 12/20/2017   GLUCOSE 111 (H) 12/20/2017   CHOL 216 (H) 12/20/2017   TRIG 113.0 12/20/2017   HDL 47.70 12/20/2017   LDLDIRECT 168.5 04/23/2013   LDLCALC 146 (H) 12/20/2017   ALT 23 12/20/2017   AST 18 12/20/2017   NA 136 12/20/2017   K 5.0 12/20/2017   CL 101 12/20/2017   CREATININE  0.83 12/20/2017   BUN 11 12/20/2017   CO2 28 12/20/2017   TSH 3.13 12/20/2017   HGBA1C 5.5 11/30/2016    ASSESSMENT AND PLAN:  Discussed the following assessment and plan:    ICD-10-CM   1. Hives ?  L50.9   2. Pruritic erythematous rash  L29.8    No hx of hives but has asthma and on singulair and inhalers    No alarm featuyres no other triggeres obvious  canat see hives at this time but  probably characterization    Not certain it is from eye drops but reasonable to stay off  And then restart  Counseled.  Stop eye drops for a  Week  xyrtec and pepcid bid  Benadryl for break through  rif sx when restarts then contact the eye doc  Cool sh0wers compresses  Contact if  persistent or progressive or alarm features  Expectant management and discussion of plan and treatment with opportunity to ask questions and all were answered. The patient agreed with the plan and demonstrated an understanding of the instructions.   Advised to call back or seek an in-person evaluation if worsening  or having  further concerns . If ongoing may need in office visit and consider other allergy eval  Shanon Ace, MD

## 2018-11-08 DIAGNOSIS — L309 Dermatitis, unspecified: Secondary | ICD-10-CM | POA: Diagnosis not present

## 2018-12-04 DIAGNOSIS — H401232 Low-tension glaucoma, bilateral, moderate stage: Secondary | ICD-10-CM | POA: Diagnosis not present

## 2018-12-20 ENCOUNTER — Other Ambulatory Visit: Payer: Self-pay | Admitting: Internal Medicine

## 2018-12-26 ENCOUNTER — Other Ambulatory Visit: Payer: Self-pay | Admitting: Internal Medicine

## 2019-01-10 DIAGNOSIS — H401232 Low-tension glaucoma, bilateral, moderate stage: Secondary | ICD-10-CM | POA: Diagnosis not present

## 2019-01-15 ENCOUNTER — Other Ambulatory Visit: Payer: Self-pay | Admitting: Internal Medicine

## 2019-01-16 DIAGNOSIS — H35371 Puckering of macula, right eye: Secondary | ICD-10-CM | POA: Diagnosis not present

## 2019-01-16 DIAGNOSIS — H4423 Degenerative myopia, bilateral: Secondary | ICD-10-CM | POA: Diagnosis not present

## 2019-01-16 DIAGNOSIS — H33101 Unspecified retinoschisis, right eye: Secondary | ICD-10-CM | POA: Diagnosis not present

## 2019-01-16 DIAGNOSIS — H353131 Nonexudative age-related macular degeneration, bilateral, early dry stage: Secondary | ICD-10-CM | POA: Diagnosis not present

## 2019-02-01 ENCOUNTER — Telehealth: Payer: Self-pay | Admitting: Internal Medicine

## 2019-02-01 NOTE — Telephone Encounter (Signed)
Medication Refill - Medication: levothyroxine (SYNTHROID) 50 MCG tablet   carvedilol (COREG) 6.25 MG tablet  Fluticasone-Salmeterol (ADVAIR) 250-50 MCG/DOSE AEPB   Has the patient contacted their pharmacy? No. (Agent: If no, request that the patient contact the pharmacy for the refill.) (Agent: If yes, when and what did the pharmacy advise?)  Preferred Pharmacy (with phone number or street name):  [facility], [place] [phone] (Phone) [phone] (Fax)     Agent: Please be advised that RX refills may take up to 3 business days. We ask that you follow-up with your pharmacy.

## 2019-02-02 ENCOUNTER — Encounter: Payer: Self-pay | Admitting: Internal Medicine

## 2019-02-02 MED ORDER — LEVOTHYROXINE SODIUM 50 MCG PO TABS: ORAL_TABLET | ORAL | 0 refills | Status: DC

## 2019-02-02 MED ORDER — CARVEDILOL 6.25 MG PO TABS: ORAL_TABLET | ORAL | 4 refills | Status: DC

## 2019-02-02 MED ORDER — FLUTICASONE-SALMETEROL 250-50 MCG/DOSE IN AEPB: 1.0000 | INHALATION_SPRAY | Freq: Two times a day (BID) | RESPIRATORY_TRACT | 6 refills | Status: DC

## 2019-02-02 NOTE — Telephone Encounter (Signed)
Medications have been sent in. 

## 2019-02-26 ENCOUNTER — Other Ambulatory Visit: Payer: Self-pay | Admitting: Internal Medicine

## 2019-02-26 DIAGNOSIS — Z1231 Encounter for screening mammogram for malignant neoplasm of breast: Secondary | ICD-10-CM

## 2019-03-14 DIAGNOSIS — D485 Neoplasm of uncertain behavior of skin: Secondary | ICD-10-CM | POA: Diagnosis not present

## 2019-03-14 DIAGNOSIS — D225 Melanocytic nevi of trunk: Secondary | ICD-10-CM | POA: Diagnosis not present

## 2019-03-14 DIAGNOSIS — Z872 Personal history of diseases of the skin and subcutaneous tissue: Secondary | ICD-10-CM | POA: Diagnosis not present

## 2019-03-14 DIAGNOSIS — B079 Viral wart, unspecified: Secondary | ICD-10-CM | POA: Diagnosis not present

## 2019-03-26 DIAGNOSIS — Z20828 Contact with and (suspected) exposure to other viral communicable diseases: Secondary | ICD-10-CM | POA: Diagnosis not present

## 2019-04-03 DIAGNOSIS — Z20828 Contact with and (suspected) exposure to other viral communicable diseases: Secondary | ICD-10-CM | POA: Diagnosis not present

## 2019-04-15 ENCOUNTER — Other Ambulatory Visit: Payer: Self-pay | Admitting: Internal Medicine

## 2019-04-17 DIAGNOSIS — J189 Pneumonia, unspecified organism: Secondary | ICD-10-CM

## 2019-04-17 HISTORY — DX: Pneumonia, unspecified organism: J18.9

## 2019-04-20 ENCOUNTER — Ambulatory Visit: Payer: Medicare Other

## 2019-05-01 NOTE — Telephone Encounter (Signed)
Spoke to pt and she stated she has made an appt. For fast med and no longer need an appt. For her and spouse

## 2019-05-02 DIAGNOSIS — Z20828 Contact with and (suspected) exposure to other viral communicable diseases: Secondary | ICD-10-CM | POA: Diagnosis not present

## 2019-05-04 ENCOUNTER — Ambulatory Visit: Payer: Self-pay

## 2019-05-04 NOTE — Telephone Encounter (Signed)
Message Routed to PCP CMA 

## 2019-05-04 NOTE — Telephone Encounter (Signed)
Pt. Asking what she can take for head congestion. Has HTN. Instructed to try Coricidin OTC and saline nasal spray. Rest and stay hydrated. Instructed to call back as needed.

## 2019-05-04 NOTE — Telephone Encounter (Signed)
Sent pt mychart message

## 2019-05-09 ENCOUNTER — Other Ambulatory Visit: Payer: Self-pay

## 2019-05-09 ENCOUNTER — Telehealth (INDEPENDENT_AMBULATORY_CARE_PROVIDER_SITE_OTHER): Payer: Medicare Other | Admitting: Family Medicine

## 2019-05-09 ENCOUNTER — Encounter: Payer: Self-pay | Admitting: Family Medicine

## 2019-05-09 DIAGNOSIS — U071 COVID-19: Secondary | ICD-10-CM

## 2019-05-09 NOTE — Progress Notes (Signed)
Virtual Visit via Video Note  I connected with Marie Kelly  on 05/09/19 at  2:00 PM EST by a video enabled telemedicine application and verified that I am speaking with the correct person using two identifiers.  Location patient: home Location provider:work or home office Persons participating in the virtual visit: patient, provider  I discussed the limitations of evaluation and management by telemedicine and the availability of in person appointments. The patient expressed understanding and agreed to proceed.   Marie Kelly DOB: 1946-02-09 Encounter date: 05/09/2019  This is a 73 y.o. female who presents with No chief complaint on file.   History of present illness: Weds 16th was diagnosed with COVID and has been dealing with it since then. Taking chlorecedin and not seeing any improvement. Husband called today and got antibiotics.   Temp 97.3 BP 124/78 Pulse 64 O2: 97  Diarrhea, chest sore from coughing. No appetite. Forcing self to eat. Drinking but going right through her. Doesn't feel short of breath.   Trying to drink gatorade.  No fevers in last couple of days.  Does use the advair regularly and has been using her flonase.   Not taking anything else OTC.     Allergies  Allergen Reactions  . Hydrochlorothiazide     Muscles tightness and felt like she was hit by a truck   No outpatient medications have been marked as taking for the 05/09/19 encounter (Telemedicine) with Caren Macadam, MD.    Review of Systems  Constitutional: Positive for activity change, appetite change and fatigue. Negative for chills and fever.  HENT: Negative for congestion, sinus pressure, sinus pain and sore throat.   Respiratory: Positive for cough. Negative for chest tightness, shortness of breath and wheezing.   Cardiovascular: Positive for chest pain (sore from coughing). Negative for palpitations and leg swelling.  Gastrointestinal: Positive for diarrhea. Negative for nausea  and vomiting.    Objective:  There were no vitals taken for this visit.      BP Readings from Last 3 Encounters:  06/29/18 117/87  04/17/18 132/82  12/20/17 112/72   Wt Readings from Last 3 Encounters:  04/17/18 209 lb 6.4 oz (95 kg)  12/20/17 198 lb (89.8 kg)  06/29/17 199 lb 4.8 oz (90.4 kg)    EXAM:  GENERAL: alert, oriented, appears well and in no acute distress  HEENT: atraumatic, conjunctiva clear, no obvious abnormalities on inspection of external nose and ears  NECK: normal movements of the head and neck  LUNGS: on inspection no signs of respiratory distress, breathing rate appears normal, no obvious gross SOB, gasping or wheezing  CV: no obvious cyanosis  MS: moves all visible extremities without noticeable abnormality  PSYCH/NEURO: pleasant and cooperative, no obvious depression or anxiety, speech and thought processing grossly intact   Assessment/Plan  1. COVID-19 She is going to get pedialyte delivered today from son. We discussed avoiding dairy to help with diarrhea, loose stools. We discussed that symptoms are prolonged with covid, but to let us know if not feeling better with above. Continue to monitor breathing, pulse ox. Call if pulse ox drops (discussed that below 90 needs eval). Continue with advair and regular medications. I am hopeful that she will feel better once diarrhea improves. Mychart message sent so she could directly respond if not noting improvement.  Return if symptoms worsen or fail to improve.    I discussed the assessment and treatment plan with the patient. The patient was provided an opportunity to ask questions and  all were answered. The patient agreed with the plan and demonstrated an understanding of the instructions.   The patient was advised to call back or seek an in-person evaluation if the symptoms worsen or if the condition fails to improve as anticipated.  I provided  15 minutes of non-face-to-face time during this  encounter.   Micheline Rough, MD

## 2019-05-10 ENCOUNTER — Other Ambulatory Visit: Payer: Self-pay | Admitting: Internal Medicine

## 2019-05-14 ENCOUNTER — Encounter: Payer: Self-pay | Admitting: Family Medicine

## 2019-05-14 ENCOUNTER — Other Ambulatory Visit: Payer: Self-pay

## 2019-05-14 ENCOUNTER — Telehealth: Payer: Self-pay | Admitting: Internal Medicine

## 2019-05-14 ENCOUNTER — Telehealth (INDEPENDENT_AMBULATORY_CARE_PROVIDER_SITE_OTHER): Payer: Medicare Other | Admitting: Family Medicine

## 2019-05-14 DIAGNOSIS — U071 COVID-19: Secondary | ICD-10-CM | POA: Insufficient documentation

## 2019-05-14 DIAGNOSIS — J40 Bronchitis, not specified as acute or chronic: Secondary | ICD-10-CM | POA: Diagnosis not present

## 2019-05-14 MED ORDER — DOXYCYCLINE HYCLATE 100 MG PO CAPS: 100.0000 mg | ORAL_CAPSULE | Freq: Two times a day (BID) | ORAL | 0 refills | Status: AC

## 2019-05-14 NOTE — Telephone Encounter (Signed)
Pt saw Dr.Koberlein on 12/23

## 2019-05-14 NOTE — Progress Notes (Signed)
Virtual Visit via Video Note  I connected with the patient on 05/14/19 at 11:00 AM EST by a video enabled telemedicine application and verified that I am speaking with the correct person using two identifiers.  Location patient: home Location provider:work or home office Persons participating in the virtual visit: patient, provider  I discussed the limitations of evaluation and management by telemedicine and the availability of in person appointments. The patient expressed understanding and agreed to proceed.   HPI: Here for a cough that she has had for several weeks. She and her husband wen ton a trip to [place] a few weeks ago, and they both developed coughs. On 05-02-19 at a [facility] urgent care they both tested positive for the Covid-19 virus. I had a virtual visit with her husband on 05-09-19 and gave him a round of Doxycycline. This worked well for him. Now Marie Kelly has chest congestion and a persistent dry cough. Her chest hurts from the coughing, but she denies any SOB or fever. Using Mucinex and Robitussin.    ROS: See pertinent positives and negatives per HPI.  Past Medical History:  Diagnosis Date  . Anemia    pt. denies  . Arthritis   . Asthma   . 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 Colon polyps   . Complication of anesthesia    2 days after surgery-muscles hurtand spasmed-needed a muscle relaxant   . Diverticulosis   . Fracture of right olecranon process 10/26/2013  . GERD (gastroesophageal reflux disease)    had endo? taking nexium 3 x per week  . Hyperlipidemia    had been on lipitor changes to pravachol cause of cost and tricor has since stopped  NO  se reported   . Hypertension   . Hypothyroidism     on meds for years  . Impaired fasting glucose   . Olecranon fracture 10/25/2013   right arm  . PONV (postoperative nausea and vomiting)   . Ventricular bigeminy seen on cardiac monitor    Cardiac work up Dr Stanford Breed [facility]  . Wears dentures    top-partial bottom  . Wears glasses     Past Surgical History:  Procedure Laterality Date  . BELPHAROPTOSIS REPAIR Bilateral   . CATARACT EXTRACTION Bilateral 11/2016  . CHOLECYSTECTOMY    . COLONOSCOPY    . HARVEST BONE GRAFT Right 04/19/2014   Procedure: HARVEST LEFT TIBIA BONE GRAFT ;  Surgeon: Johnny Bridge, MD;  Location: [facility];  Service: Orthopedics;  Laterality: Right;  . HEMORROIDECTOMY    . KNEE ARTHROSCOPY Right   . NASAL SINUS SURGERY    . ORIF ELBOW FRACTURE Right 10/26/2013   Procedure: RIGHT ELBOW FRACTUE OPEN TREATMENT ULNAR PROXIMAL END OLECRANON PROCESS INCLUDES INTERNAL FIXATION;  Surgeon: Johnny Bridge, MD;  Location: [facility];  Service: Orthopedics;  Laterality: Right;  . ORIF ELBOW FRACTURE Right 04/19/2014   Procedure: OPEN REDUCTION INTERNAL FIXATION (ORIF) RIGHT ELBOW/OLECRANON NONUNION FRACTURE WITH HARDWARE REMOVAL;  Surgeon: Johnny Bridge, MD;  Location: [facility];  Service: Orthopedics;  Laterality: Right;  . TONSILLECTOMY    . TUBAL LIGATION    . uterus repair      Family History  Problem Relation Age of Onset  . Stroke Mother   . Diabetes Father   . Heart disease Father   . Alzheimer's disease Other   . Stomach cancer Maternal Aunt   . Colon  cancer Neg Hx   . Pancreatic cancer Neg Hx      Current Outpatient Medications:  .  albuterol (PROVENTIL HFA;VENTOLIN HFA) 108 (90 Base) MCG/ACT inhaler, Inhale 2 puffs into the lungs every 6 (six) hours as needed for wheezing or shortness of breath., Disp: 1 Inhaler, Rfl: 1 .  amoxicillin-clavulanate (AUGMENTIN) 875-125 MG tablet, Take 1 tablet by mouth every 12 (twelve) hours. For sinusitis, Disp: 14 tablet, Rfl: 0 .  bimatoprost (LUMIGAN) 0.01 % SOLN, Place 1 drop into both eyes at bedtime., Disp: , Rfl:  .  carvedilol (COREG) 6.25 MG tablet, TAKE 1 TABLET IN THE MORNING AND 2 TABLETS IN THE EVENING, Disp: 270 tablet, Rfl:  1 .  Fluticasone-Salmeterol (ADVAIR) 250-50 MCG/DOSE AEPB, Inhale 1 puff into the lungs 2 (two) times daily. GENERIC ONLY, Disp: 1 each, Rfl: 6 .  levothyroxine (SYNTHROID) 50 MCG tablet, TAKE 1 TABLET DAILY (NEED APPOINTMENT NO FURTHER REFILLS UNTIL SEEN), Disp: 90 tablet, Rfl: 3 .  meloxicam (MOBIC) 15 MG tablet, Take 15 mg by mouth daily as needed for pain., Disp: , Rfl:  .  montelukast (SINGULAIR) 10 MG tablet, TAKE 1 TABLET AT BEDTIME, Disp: 90 tablet, Rfl: 3 .  telmisartan (MICARDIS) 80 MG tablet, TAKE 1 TABLET DAILY, Disp: 90 tablet, Rfl: 3 .  tolterodine (DETROL LA) 2 MG 24 hr capsule, TAKE 1 CAPSULE DAILY, Disp: 90 capsule, Rfl: 3 .  valACYclovir (VALTREX) 1000 MG tablet, , Disp: , Rfl:   EXAM:  VITALS per patient if applicable:  GENERAL: alert, oriented, appears well and in no acute distress  HEENT: atraumatic, conjunttiva clear, no obvious abnormalities on inspection of external nose and ears  NECK: normal movements of the head and neck  LUNGS: on inspection no signs of respiratory distress, breathing rate appears normal, no obvious gross SOB, gasping or wheezing  CV: no obvious cyanosis  MS: moves all visible extremities without noticeable abnormality  PSYCH/NEURO: pleasant and cooperative, no obvious depression or anxiety, speech and thought processing grossly intact  ASSESSMENT AND PLAN: Bronchitis from a Covid-19 infection. Treat with Doxycycline. Follow up prn.  Alysia Penna, MD  Discussed the following assessment and plan:  No diagnosis found.     I discussed the assessment and treatment plan with the patient. The patient was provided an opportunity to ask questions and all were answered. The patient agreed with the plan and demonstrated an understanding of the instructions.   The patient was advised to call back or seek an in-person evaluation if the symptoms worsen or if the condition fails to improve as anticipated.

## 2019-05-14 NOTE — Progress Notes (Signed)
Of note, her oxygen saturation at home this morning was 96% per her husband.

## 2019-05-14 NOTE — Telephone Encounter (Signed)
Spoke with pt-see prior note.

## 2019-05-14 NOTE — Telephone Encounter (Signed)
Spoke with the pt and scheduled a virtual visit with Dr Sarajane Jews for today at [facility].

## 2019-05-14 NOTE — Telephone Encounter (Signed)
Pt is having coughing and is asking for antibiotic.  She was diagnosed with covid 19.  She was last seen on 12/23 Pt is asking for a call back to [phone]

## 2019-05-14 NOTE — Telephone Encounter (Signed)
See prior note from PCP.

## 2019-05-17 ENCOUNTER — Emergency Department (INDEPENDENT_AMBULATORY_CARE_PROVIDER_SITE_OTHER): Payer: Medicare Other

## 2019-05-17 ENCOUNTER — Emergency Department (INDEPENDENT_AMBULATORY_CARE_PROVIDER_SITE_OTHER)
Admission: EM | Admit: 2019-05-17 | Discharge: 2019-05-17 | Disposition: A | Discharge status: Home / Self Care | Payer: Medicare Other | Source: Home / Self Care

## 2019-05-17 ENCOUNTER — Other Ambulatory Visit: Payer: Self-pay

## 2019-05-17 DIAGNOSIS — R05 Cough: Secondary | ICD-10-CM | POA: Diagnosis not present

## 2019-05-17 DIAGNOSIS — U071 COVID-19: Secondary | ICD-10-CM

## 2019-05-17 DIAGNOSIS — J1289 Other viral pneumonia: Secondary | ICD-10-CM

## 2019-05-17 DIAGNOSIS — J1282 Pneumonia due to coronavirus disease 2019: Secondary | ICD-10-CM

## 2019-05-17 MED ORDER — AZITHROMYCIN 250 MG PO TABS: ORAL_TABLET | ORAL | 0 refills | Status: AC

## 2019-05-17 MED ORDER — ALBUTEROL SULFATE (2.5 MG/3ML) 0.083% IN NEBU: 2.5000 mg | INHALATION_SOLUTION | Freq: Four times a day (QID) | RESPIRATORY_TRACT | 12 refills | Status: DC | PRN

## 2019-05-17 MED ORDER — METHYLPREDNISOLONE 4 MG PO TBPK: ORAL_TABLET | ORAL | 0 refills | Status: DC

## 2019-05-17 NOTE — Discharge Instructions (Addendum)
The covid care team will call you today to schedule treatment. Stop doxycycline.

## 2019-05-17 NOTE — ED Triage Notes (Signed)
Diagnosed with covid on 12/16.  Started on tetracycline 4 days ago.  Not getting any better.  Has a cough and is weak.

## 2019-05-17 NOTE — Progress Notes (Signed)
Covid-19 home visit on behalf of [facility].  Visit w/assessment & VS transcribed by [facility] staff and documented in [place].  Glory Buff, DNP was involved in the visit via FaceTime

## 2019-05-17 NOTE — ED Provider Notes (Signed)
Marie Kelly CARE    CSN: [id number] Arrival date & time: 05/17/19  1029      History   Chief Complaint Chief Complaint  Patient presents with  . Cough    HPI Marie Kelly is a 73 y.o. female.   The history is provided by the patient. No language interpreter was used.  Cough Cough characteristics:  Non-productive Progression:  Worsening Chronicity:  New Pt had a positive covid test on dec 16.  Pt reports her Md started her on doxycycline 3 days ago for weakness and shortness of breath. Pt's husband has had the same but is improving.  Pt reports she began having symptoms after traveling to [place].  Pt complains of weakness and fatigue with walking.   Past Medical History:  Diagnosis Date  . Anemia    pt. denies  . Arthritis   . Asthma   . 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 Colon polyps   . Complication of anesthesia    2 days after surgery-muscles hurtand spasmed-needed a muscle relaxant   . Diverticulosis   . Fracture of right olecranon process 10/26/2013  . GERD (gastroesophageal reflux disease)    had endo? taking nexium 3 x per week  . Hyperlipidemia    had been on lipitor changes to pravachol cause of cost and tricor has since stopped  NO  se reported   . Hypertension   . Hypothyroidism     on meds for years  . Impaired fasting glucose   . Olecranon fracture 10/25/2013   right arm  . PONV (postoperative nausea and vomiting)   . Ventricular bigeminy seen on cardiac monitor    Cardiac work up Dr [facility]  . Wears dentures    top-partial bottom  . Wears glasses     Patient Active Problem List   Diagnosis Date Noted  . COVID-19 virus infection 05/14/2019  . Essential hypertension 10/23/2014  . Bigeminy 10/23/2014  . Elevated BP 09/20/2014  . Fracture of right olecranon process 10/26/2013  . Asthma   . Anemia   . Diverticulosis   . Colon polyps   . Ventricular bigeminy seen on cardiac  monitor   . PVC's (premature ventricular contractions) 06/01/2013  . Irregular heart rate 04/25/2013  . Arthritis of right knee 04/25/2013  . Osteoporosis hx ?of on fosamax  03/11/2012  . Elevated blood pressure reading 09/04/2011  . Obesity 07/04/2011  . Early stage glaucoma 07/04/2011  . History of osteopenia 06/30/2011  . Asthma 06/29/2011  . Hormone replacement therapy (postmenopausal) 06/29/2011  . Lumpy breasts 06/29/2011  . Urinary urgency 06/29/2011  . Hypothyroidism   . Impaired fasting glucose   . Hyperlipidemia   . GERD (gastroesophageal reflux disease)     Past Surgical History:  Procedure Laterality Date  . BELPHAROPTOSIS REPAIR Bilateral   . CATARACT EXTRACTION Bilateral 11/2016  . CHOLECYSTECTOMY    . COLONOSCOPY    . HARVEST BONE GRAFT Right 04/19/2014   Procedure: HARVEST LEFT TIBIA BONE GRAFT ;  Surgeon: Johnny Bridge, MD;  Location: [place];  Service: Orthopedics;  Laterality: Right;  . HEMORROIDECTOMY    . KNEE ARTHROSCOPY Right   . NASAL SINUS SURGERY    . ORIF ELBOW FRACTURE Right 10/26/2013   Procedure: RIGHT ELBOW FRACTUE OPEN TREATMENT ULNAR PROXIMAL END OLECRANON PROCESS INCLUDES INTERNAL FIXATION;  Surgeon: Johnny Bridge, MD;  Location: [place];  Service: Orthopedics;  Laterality:  Right;  Marland Kitchen ORIF ELBOW FRACTURE Right 04/19/2014   Procedure: OPEN REDUCTION INTERNAL FIXATION (ORIF) RIGHT ELBOW/OLECRANON NONUNION FRACTURE WITH HARDWARE REMOVAL;  Surgeon: Johnny Bridge, MD;  Location: [place];  Service: Orthopedics;  Laterality: Right;  . TONSILLECTOMY    . TUBAL LIGATION    . uterus repair      OB History   No obstetric history on file.      Home Medications    Prior to Admission medications   Medication Sig Start Date End Date Taking? Authorizing Provider  albuterol (PROVENTIL HFA;VENTOLIN HFA) 108 (90 Base) MCG/ACT inhaler Inhale 2 puffs into the lungs every 6 (six) hours as needed for  wheezing or shortness of breath. 06/02/17 06/02/18  Panosh, Standley Brooking, MD  bimatoprost (LUMIGAN) 0.01 % SOLN Place 1 drop into both eyes at bedtime.    [provider]  carvedilol (COREG) 6.25 MG tablet TAKE 1 TABLET IN THE MORNING AND 2 TABLETS IN THE EVENING 04/16/19   Panosh, Standley Brooking, MD  doxycycline (VIBRAMYCIN) 100 MG capsule Take 1 capsule (100 mg total) by mouth 2 (two) times daily for 10 days. 05/14/19 05/24/19  Laurey Morale, MD  Fluticasone-Salmeterol (ADVAIR) 250-50 MCG/DOSE AEPB Inhale 1 puff into the lungs 2 (two) times daily. GENERIC ONLY 02/02/19   Panosh, Standley Brooking, MD  levothyroxine (SYNTHROID) 50 MCG tablet TAKE 1 TABLET DAILY (NEED APPOINTMENT NO FURTHER REFILLS UNTIL SEEN) 05/10/19   Panosh, Standley Brooking, MD  meloxicam (MOBIC) 15 MG tablet Take 15 mg by mouth daily as needed for pain.    [provider]  montelukast (SINGULAIR) 10 MG tablet TAKE 1 TABLET AT BEDTIME 12/20/18   Panosh, Standley Brooking, MD  telmisartan (MICARDIS) 80 MG tablet TAKE 1 TABLET DAILY 01/16/19   Panosh, Standley Brooking, MD  tolterodine (DETROL LA) 2 MG 24 hr capsule TAKE 1 CAPSULE DAILY 12/20/18   Panosh, Standley Brooking, MD  valACYclovir (VALTREX) 1000 MG tablet  06/26/18   [provider]    Family History Family History  Problem Relation Age of Onset  . Stroke Mother   . Diabetes Father   . Heart disease Father   . Alzheimer's disease Other   . Stomach cancer Maternal Aunt   . Colon cancer Neg Hx   . Pancreatic cancer Neg Hx     Social History Social History   Tobacco Use  . Smoking status: Former Smoker    Quit date: 10/25/1973    Years since quitting: 45.5  . Smokeless tobacco: Never Used  Substance Use Topics  . Alcohol use: Yes    Alcohol/week: 0.0 standard drinks    Comment: socially  . Drug use: No     Allergies   Hydrochlorothiazide   Review of Systems Review of Systems  Respiratory: Positive for cough.   All other systems reviewed and are negative.    Physical Exam Triage  Vital Signs ED Triage Vitals  Enc Vitals Group     BP 05/17/19 1100 116/75     Pulse Rate 05/17/19 1100 66     Resp 05/17/19 1100 (!) 22     Temp 05/17/19 1100 (!) 97.5 F (36.4 C)     Temp Source 05/17/19 1100 Oral     SpO2 05/17/19 1100 98 %     Weight 05/17/19 1058 207 lb (93.9 kg)     Height 05/17/19 1058 5\' 4"  (1.626 m)     Head Circumference --      Peak Flow --  Pain Score 05/17/19 1058 Asleep     Pain Loc --      Pain Edu? --      Excl. in [place]? --    No data found.  Updated Vital Signs BP 116/75 (BP Location: Right Arm)   Pulse 66   Temp (!) 97.5 F (36.4 C) (Oral)   Resp (!) 22   Ht 5\' 4"  (1.626 m)   Wt 93.9 kg   SpO2 98%   BMI 35.53 kg/m   Visual Acuity Right Eye Distance:   Left Eye Distance:   Bilateral Distance:    Right Eye Near:   Left Eye Near:    Bilateral Near:     Physical Exam Vitals and nursing note reviewed.  Constitutional:      Appearance: She is well-developed.  HENT:     Head: Normocephalic.     Right Ear: Tympanic membrane normal.     Left Ear: Tympanic membrane normal.     Nose: Nose normal.     Mouth/Throat:     Mouth: Mucous membranes are moist.  Cardiovascular:     Rate and Rhythm: Normal rate.  Pulmonary:     Breath sounds: Rhonchi present.  Abdominal:     General: There is no distension.  Musculoskeletal:        General: Normal range of motion.     Cervical back: Normal range of motion.  Neurological:     Mental Status: She is alert and oriented to person, place, and time.  Psychiatric:        Mood and Affect: Mood normal.      UC Treatments / Results  Labs (all labs ordered are listed, but only abnormal results are displayed) Labs Reviewed - No data to display  EKG   Radiology CXR PA/Lat  Result Date: 05/17/2019 CLINICAL DATA:  Cough and fatigue, recent COVID positive EXAM: CHEST - 2 VIEW COMPARISON:  None. FINDINGS: There are patchy bilateral interstitial opacities. No pleural effusion or  pneumothorax. Cardiomediastinal silhouette is within normal limits. IMPRESSION: Patchy bilateral opacities suspicious for COVID-19 pneumonia. Electronically Signed   By: Macy Mis M.D.   On: 05/17/2019 11:50    Procedures Procedures (including critical care time)  Medications Ordered in UC Medications - No data to display  Initial Impression / Assessment and Plan / UC Course  I have reviewed the triage vital signs and the nursing notes.  Pertinent labs & imaging results that were available during my care of the patient were reviewed by me and considered in my medical decision making (see chart for details).     MDM  Chest xray shows bilat patchy opacities consistent with covid pneumonia.  Pt is 38 and has multiple medical illness I am concerned that she may not do wll.   I spoke with the NP at the [facility].  Pt does meet criteria for their outpatient treatment program.  Pt started on medrol, albuterol and zithromax.  They will see pt today.  They will call pt with instructions.  Final Clinical Impressions(s) / UC Diagnoses   Final diagnoses:  Pneumonia due to COVID-19 virus   Discharge Instructions   None    ED Prescriptions    Medication Sig Dispense Auth. Provider   albuterol (PROVENTIL) (2.5 MG/3ML) 0.083% nebulizer solution Take 3 mLs (2.5 mg total) by nebulization every 6 (six) hours as needed for wheezing or shortness of breath. 75 mL Ellanora Rayborn K, PA-C   azithromycin (ZITHROMAX Z-PAK) 250 MG  tablet Take 2 tablets a day then one tablet a day for 9 days 6 each Dejon Lukas K, PA-C   methylPREDNISolone (MEDROL DOSEPAK) 4 MG TBPK tablet Taper 6,5,4,3,2,1 21 tablet Fransico Meadow, [place]     PDMP not reviewed this encounter.  An After Visit Summary was printed and given to the patient.    Fransico Meadow, [place] 05/17/19 1248

## 2019-05-19 NOTE — Progress Notes (Signed)
Covid-19 home visit follow-up on behalf of [facility] on 05/18/19.  Marie Kelly is doing much better today than she had been yesterday.  She is up walking around and is more upbeat in her demeanor.  VS and other visit details transcribed by Glory Buff, DNP in [place].

## 2019-05-31 ENCOUNTER — Ambulatory Visit: Payer: Medicare Other

## 2019-06-08 ENCOUNTER — Telehealth: Payer: Self-pay

## 2019-06-08 ENCOUNTER — Other Ambulatory Visit: Payer: Self-pay

## 2019-06-08 NOTE — Telephone Encounter (Signed)
Call placed reference PREP program referral. Patient has previously done the program.  Interested in participating in next session.  Anticipated starting in Mid Feb Will call back to schedule intake

## 2019-06-11 ENCOUNTER — Ambulatory Visit: Payer: Medicare Other

## 2019-06-11 ENCOUNTER — Other Ambulatory Visit: Payer: Self-pay

## 2019-06-11 ENCOUNTER — Ambulatory Visit (INDEPENDENT_AMBULATORY_CARE_PROVIDER_SITE_OTHER): Payer: Medicare Other | Admitting: Internal Medicine

## 2019-06-11 ENCOUNTER — Other Ambulatory Visit (INDEPENDENT_AMBULATORY_CARE_PROVIDER_SITE_OTHER): Payer: Medicare Other

## 2019-06-11 ENCOUNTER — Encounter: Payer: Self-pay | Admitting: Internal Medicine

## 2019-06-11 VITALS — BP 120/62 | HR 72 | Temp 97.6°F | Ht 62.5 in | Wt 204.2 lb

## 2019-06-11 DIAGNOSIS — E785 Hyperlipidemia, unspecified: Secondary | ICD-10-CM

## 2019-06-11 DIAGNOSIS — Z79899 Other long term (current) drug therapy: Secondary | ICD-10-CM | POA: Diagnosis not present

## 2019-06-11 DIAGNOSIS — I10 Essential (primary) hypertension: Secondary | ICD-10-CM | POA: Diagnosis not present

## 2019-06-11 DIAGNOSIS — E039 Hypothyroidism, unspecified: Secondary | ICD-10-CM

## 2019-06-11 DIAGNOSIS — J4520 Mild intermittent asthma, uncomplicated: Secondary | ICD-10-CM | POA: Diagnosis not present

## 2019-06-11 DIAGNOSIS — Z8616 Personal history of COVID-19: Secondary | ICD-10-CM

## 2019-06-11 LAB — CBC WITH DIFFERENTIAL/PLATELET
Basophils Absolute: 0 10*3/uL (ref 0.0–0.1)
Basophils Relative: 0.5 % (ref 0.0–3.0)
Eosinophils Absolute: 0.5 10*3/uL (ref 0.0–0.7)
Eosinophils Relative: 8 % — ABNORMAL HIGH (ref 0.0–5.0)
HCT: 37.3 % (ref 36.0–46.0)
Hemoglobin: 12.1 g/dL (ref 12.0–15.0)
Lymphocytes Relative: 29.3 % (ref 12.0–46.0)
Lymphs Abs: 1.8 10*3/uL (ref 0.7–4.0)
MCHC: 32.4 g/dL (ref 30.0–36.0)
MCV: 88.5 fl (ref 78.0–100.0)
Monocytes Absolute: 0.6 10*3/uL (ref 0.1–1.0)
Monocytes Relative: 10 % (ref 3.0–12.0)
Neutro Abs: 3.2 10*3/uL (ref 1.4–7.7)
Neutrophils Relative %: 52.2 % (ref 43.0–77.0)
Platelets: 300 10*3/uL (ref 150.0–400.0)
RBC: 4.21 Mil/uL (ref 3.87–5.11)
RDW: 15.1 % (ref 11.5–15.5)
WBC: 6.2 10*3/uL (ref 4.0–10.5)

## 2019-06-11 LAB — HEPATIC FUNCTION PANEL
ALT: 22 U/L (ref 0–35)
AST: 15 U/L (ref 0–37)
Albumin: 4.2 g/dL (ref 3.5–5.2)
Alkaline Phosphatase: 51 U/L (ref 39–117)
Bilirubin, Direct: 0.1 mg/dL (ref 0.0–0.3)
Total Bilirubin: 0.6 mg/dL (ref 0.2–1.2)
Total Protein: 7.1 g/dL (ref 6.0–8.3)

## 2019-06-11 LAB — BASIC METABOLIC PANEL
BUN: 14 mg/dL (ref 6–23)
CO2: 27 mEq/L (ref 19–32)
Calcium: 9.7 mg/dL (ref 8.4–10.5)
Chloride: 104 mEq/L (ref 96–112)
Creatinine, Ser: 0.8 mg/dL (ref 0.40–1.20)
GFR: 70.09 mL/min (ref >60.00)
Glucose, Bld: 95 mg/dL (ref 70–99)
Potassium: 4.7 mEq/L (ref 3.5–5.1)
Sodium: 137 mEq/L (ref 135–145)

## 2019-06-11 LAB — LIPID PANEL
Cholesterol: 231 mg/dL — ABNORMAL HIGH (ref 0–200)
HDL: 43.2 mg/dL (ref >39.00)
LDL Cholesterol: 153 mg/dL — ABNORMAL HIGH (ref 0–99)
NonHDL: 187.38
Total CHOL/HDL Ratio: 5
Triglycerides: 173 mg/dL — ABNORMAL HIGH (ref 0.0–149.0)
VLDL: 34.6 mg/dL (ref 0.0–40.0)

## 2019-06-11 LAB — TSH: TSH: 2.75 u[IU]/mL (ref 0.35–4.50)

## 2019-06-11 LAB — HEMOGLOBIN A1C: Hgb A1c MFr Bld: 6.1 % (ref 4.6–6.5)

## 2019-06-11 NOTE — Progress Notes (Signed)
This visit occurred during the SARS-CoV-2 public health emergency.  Safety protocols were in place, including screening questions prior to the visit, additional usage of staff PPE, and extensive cleaning of exam room while observing appropriate contact time as indicated for disinfecting solutions.    Chief Complaint  Patient presents with  . Annual Exam    pt has no concerns today   . Medication Management  . Hypertension    HPI: Patient  Marie Kelly  74 y.o. comes in today for yearly visit   Bp had been ok until covid infection and then after   Had to hold med at some point  And had been on meds as ordered  For only a week.   bp 187/76   Today 132/76    Thyroid: taking  Daily no changes  Dx covid 10 pna 12 30 20   With cough fatigue  And ed visit  resp no residual  Wine taste  Like vinegar.  Gi back to baseline.  Antibiotic and pna. In UC  Steroids and antibiotic?   Husband Was positive  Without sx until laster after trip to [facility] but no one else in team got sick   Husband was positive still has cough   Resp:   Taking advair daily and as needed neb but hasnt needed it    Health Maintenance  Topic Date Due  . MAMMOGRAM  02/17/2020  . TETANUS/TDAP  09/08/2021  . COLONOSCOPY  02/04/2026  . INFLUENZA VACCINE  Completed  . DEXA SCAN  Completed  . Hepatitis C Screening  Completed  . PNA vac Low Risk Adult  Completed   Health Maintenance Review LIFESTYLE:  Exercise:  Just started  A week of activity  Tired after walking  6- 10 minutes   Tobacco/ETS:  no Alcohol: not a lot nothing  Sugar beverages: no Sleep: 7-8 Drug use: no HH of  2  No pets  Work:NA   ROS:  GEN/ HEENT: No fever, significant weight changes sweats headaches vision problems hearing changes, CV/ PULM; No chest pain shortness of breath cough, syncope,edema  change in exercise tolerance. GI /GU: No adominal pain, vomiting, change in bowel habits. No blood in the stool. No significant GU symptoms.  SKIN/HEME: ,no acute skin rashes suspicious lesions or bleeding. No lymphadenopathy, nodules, masses.  NEURO/ PSYCH:  No neurologic signs such as weakness numbness. No depression anxiety. IMM/ Allergy: No unusual infections.  Allergy .   REST of 12 system review negative except as per HPI   Past Medical History:  Diagnosis Date  . Anemia    pt. denies  . Arthritis   . Asthma   . 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 Colon polyps   . Complication of anesthesia    2 days after surgery-muscles hurtand spasmed-needed a muscle relaxant   . Diverticulosis   . Fracture of right olecranon process 10/26/2013  . GERD (gastroesophageal reflux disease)    had endo? taking nexium 3 x per week  . Hyperlipidemia    had been on lipitor changes to pravachol cause of cost and tricor has since stopped  NO  se reported   . Hypertension   . Hypothyroidism     on meds for years  . Impaired fasting glucose   . Olecranon fracture 10/25/2013   right arm  . PONV (postoperative nausea and vomiting)   . Ventricular bigeminy seen on cardiac monitor    Cardiac work up  Dr [facility]  . Wears dentures    top-partial bottom  . Wears glasses     Past Surgical History:  Procedure Laterality Date  . BELPHAROPTOSIS REPAIR Bilateral   . CATARACT EXTRACTION Bilateral 11/2016  . CHOLECYSTECTOMY    . COLONOSCOPY    . HARVEST BONE GRAFT Right 04/19/2014   Procedure: HARVEST LEFT TIBIA BONE GRAFT ;  Surgeon: Johnny Bridge, MD;  Location: [place];  Service: Orthopedics;  Laterality: Right;  . HEMORROIDECTOMY    . KNEE ARTHROSCOPY Right   . NASAL SINUS SURGERY    . ORIF ELBOW FRACTURE Right 10/26/2013   Procedure: RIGHT ELBOW FRACTUE OPEN TREATMENT ULNAR PROXIMAL END OLECRANON PROCESS INCLUDES INTERNAL FIXATION;  Surgeon: Johnny Bridge, MD;  Location: [place];  Service: Orthopedics;  Laterality: Right;  . ORIF ELBOW FRACTURE  Right 04/19/2014   Procedure: OPEN REDUCTION INTERNAL FIXATION (ORIF) RIGHT ELBOW/OLECRANON NONUNION FRACTURE WITH HARDWARE REMOVAL;  Surgeon: Johnny Bridge, MD;  Location: [place];  Service: Orthopedics;  Laterality: Right;  . TONSILLECTOMY    . TUBAL LIGATION    . uterus repair      Family History  Problem Relation Age of Onset  . Stroke Mother   . Diabetes Father   . Heart disease Father   . Alzheimer's disease Other   . Stomach cancer Maternal Aunt   . Colon cancer Neg Hx   . Pancreatic cancer Neg Hx       Outpatient Medications Prior to Visit  Medication Sig Dispense Refill  . albuterol (PROVENTIL) (2.5 MG/3ML) 0.083% nebulizer solution Take 3 mLs (2.5 mg total) by nebulization every 6 (six) hours as needed for wheezing or shortness of breath. 75 mL 12  . bimatoprost (LUMIGAN) 0.01 % SOLN Place 1 drop into both eyes at bedtime.    . carvedilol (COREG) 6.25 MG tablet TAKE 1 TABLET IN THE MORNING AND 2 TABLETS IN THE EVENING 270 tablet 1  . Fluticasone-Salmeterol (ADVAIR) 250-50 MCG/DOSE AEPB Inhale 1 puff into the lungs 2 (two) times daily. GENERIC ONLY 1 each 6  . levothyroxine (SYNTHROID) 50 MCG tablet TAKE 1 TABLET DAILY (NEED APPOINTMENT NO FURTHER REFILLS UNTIL SEEN) 90 tablet 3  . meloxicam (MOBIC) 15 MG tablet Take 15 mg by mouth daily as needed for pain.    . montelukast (SINGULAIR) 10 MG tablet TAKE 1 TABLET AT BEDTIME 90 tablet 3  . telmisartan (MICARDIS) 80 MG tablet TAKE 1 TABLET DAILY 90 tablet 3  . tolterodine (DETROL LA) 2 MG 24 hr capsule TAKE 1 CAPSULE DAILY 90 capsule 3  . valACYclovir (VALTREX) 1000 MG tablet     . albuterol (PROVENTIL HFA;VENTOLIN HFA) 108 (90 Base) MCG/ACT inhaler Inhale 2 puffs into the lungs every 6 (six) hours as needed for wheezing or shortness of breath. 1 Inhaler 1  . methylPREDNISolone (MEDROL DOSEPAK) 4 MG TBPK tablet Taper 6,5,4,3,2,1 (Patient not taking: Reported on 06/11/2019) 21 tablet 0   No  facility-administered medications prior to visit.     EXAM:  BP 120/62 (BP Location: Right Arm, Patient Position: Sitting, Cuff Size: Normal)   Pulse 72   Temp 97.6 F (36.4 C) (Temporal)   Ht 5' 2.5" (1.588 m)   Wt 204 lb 3.2 oz (92.6 kg)   SpO2 95%   BMI 36.75 kg/m   Body mass index is 36.75 kg/m. Wt Readings from Last 3 Encounters:  06/11/19 204 lb 3.2 oz (92.6 kg)  05/17/19 207  lb (93.9 kg)  04/17/18 209 lb 6.4 oz (95 kg)    Physical Exam: Vital signs reviewed [id number] is a well-developed well-nourished alert cooperative    who appearsr stated age in no acute distress.  HEENT: normocephalic atraumat, ic , Eyes: PERRL EOM's full, conjunctiva clear, s.Ears: no deformity EAC's clear TMs with normal landmarks. Mouth:masked NECK: supple without masses, thyromegaly or bruits. CHEST/PULM:  Clear to auscultation and percussion breath sounds equal no wheeze , rales or rhonchi. No chest wall deformities or tenderness. CV: PMI is nondisplaced, S1 S2 no gallops, murmurs, rubs. Peripheral pulses are full without delay.No JVD .  ABDOMEN: Bowel sounds normal nontender  No guard or rebound, no hepato splenomegal no CVA tenderness.  . Extremtities:  No clubbing cyanosis or edema, no acute joint swelling or redness no focal atrophy NEURO:  Oriented x3, cranial nerves 3-12 appear to be intact, no obvious focal weakness,gait within normal limits no abnormal reflexes or asymmetrical SKIN: No acute rashes normal turgor, color, no bruising or petechiae. PSYCH: Oriented, good eye contact, no obvious depression anxiety, cognition and judgment appear normal. LN: no cervical axillary  adenopathy  Lab Results  Component Value Date   WBC 6.2 06/11/2019   HGB 12.1 06/11/2019   HCT 37.3 06/11/2019   PLT 300.0 06/11/2019   GLUCOSE 95 06/11/2019   CHOL 231 (H) 06/11/2019   TRIG 173.0 (H) 06/11/2019   HDL 43.20 06/11/2019   LDLDIRECT 168.5 04/23/2013   LDLCALC 153 (H) 06/11/2019   ALT 22  06/11/2019   AST 15 06/11/2019   NA 137 06/11/2019   K 4.7 06/11/2019   CL 104 06/11/2019   CREATININE 0.80 06/11/2019   BUN 14 06/11/2019   CO2 27 06/11/2019   TSH 2.75 06/11/2019   HGBA1C 6.1 06/11/2019    BP Readings from Last 3 Encounters:  06/11/19 120/62  05/17/19 116/75  06/29/18 117/87    Lab plan  reviewed with patient   ASSESSMENT AND PLAN:  Discussed the following assessment and plan:    ICD-10-CM   1. Essential hypertension  99991111 Basic metabolic panel    CBC with Differential/Platelet    Hemoglobin A1c    Hepatic function panel    Lipid panel    TSH   up and down since covid but  only back on med for aweek   2. Hyperlipidemia, unspecified hyperlipidemia type  99991111 Basic metabolic panel    CBC with Differential/Platelet    Hemoglobin A1c    Hepatic function panel    Lipid panel    TSH  3. Hypothyroidism, unspecified type  0000000 Basic metabolic panel    CBC with Differential/Platelet    Hemoglobin A1c    Hepatic function panel    Lipid panel    TSH  4. Intermittent asthma without complication, unspecified asthma severity  [id number] Basic metabolic panel    CBC with Differential/Platelet    Hemoglobin A1c    Hepatic function panel    Lipid panel    TSH  5. Medication management  [id number] Basic metabolic panel    CBC with Differential/Platelet    Hemoglobin A1c    Hepatic function panel    Lipid panel    TSH  6. History of COVID-19  [id number] Basic metabolic panel    CBC with Differential/Platelet    Hemoglobin A1c    Hepatic function panel    Lipid panel    TSH    DG Chest 2 View    CANCELED: DG Chest 2 View  CANCELED: DG Chest 2 View   pneumonia 12 30 20  not hospitalized, advised fu cxray to baseline  in 1-2 mos    Disc  meds  And bp control and send in readings  Before consider ing changing  meds  Check on bone density   Feels has dec height  Will review record  Patient Care Team: Anthonette Lesage, Standley Brooking, MD as PCP - General (Internal Medicine)  Shon Hough, MD (Ophthalmology) Marchia Bond, MD as Consulting Physician (Orthopedic Surgery) Patient Instructions  Lab today  Plan c xray in 1-2 mos  Unless problem   [place] site.  Still get covid vaccine when possible but prob immune for 3 months .   Send in BP readings  5 days worth  Stay on same dose in interim.    Standley Brooking. Marie Kelly M.D.  Date of study: 09/25/2014 Exam: DUAL X-RAY ABSORPTIOMETRY (DXA) FOR BONE MINERAL DENSITY (BMD) Instrument: [employer] Requesting Provider: PCP Indication: follow up for osteoporosis Comparison: none (please note that it is not possible to compare data from different instruments) Clinical data: Pt is a postmenopausal 74 y.o. female with previous h/o elbow and shoulder fracture.   Results:  Lumbar spine (L1-L3) Femoral neck (FN)  T-score  +0.7  RFN: -1.2 LFN: -0.9    Assessment: the BMD is low according to the [facility] classification for osteoporosis (see below). Fracture risk: moderate Comments: the technical quality of the study is good, however, the spine is scoliotic and arthritic. Calcium accumulation in arthritic sites can confound the results of the bone density scan. L4 vertebra had to be excluded from analysis. Evaluation for secondary causes should be considered if clinically indicated.  Recommend optimizing calcium (1200 mg/day) and vitamin D (800 IU/day) intake.  Followup: Repeat BMD is appropriate after 2 years.

## 2019-06-11 NOTE — Progress Notes (Signed)
cxr 

## 2019-06-11 NOTE — Patient Instructions (Addendum)
Lab today  Plan c xray in 1-2 mos  Unless problem   [place] site.  Still get covid vaccine when possible but prob immune for 3 months .   Send in BP readings  5 days worth  Stay on same dose in interim.

## 2019-06-11 NOTE — Addendum Note (Signed)
Addended by: Francis Dowse T on: 06/11/2019 03:03 PM   Modules accepted: Orders

## 2019-06-11 NOTE — Addendum Note (Signed)
Addended by: Francis Dowse T on: 06/11/2019 02:59 PM   Modules accepted: Orders

## 2019-06-14 ENCOUNTER — Telehealth: Payer: Self-pay

## 2019-06-15 NOTE — Telephone Encounter (Signed)
Please make sure  Patient gets correct info and she isnt chaarged unless  X ray is completed   Tell patient that unless  The test is completed she should jnot get charged . The order sits   In the system until expires or done

## 2019-06-18 NOTE — Telephone Encounter (Signed)
error 

## 2019-06-20 NOTE — Progress Notes (Signed)
Results  stable , cholesterol could e better  attend to healthy eating and activity to get the levels better

## 2019-06-22 ENCOUNTER — Telehealth: Payer: Self-pay

## 2019-06-22 NOTE — Telephone Encounter (Signed)
Agreeable to starting PREP on 3/9 Tues/Thur class at 230p. Intake scheduled for 3/1 at 1pm.

## 2019-06-26 NOTE — Telephone Encounter (Signed)
cannot tell.      No definite answer   But possible   So give  Yourself time to rest the next day   Symptoms  You had  were pretty typical . And not alarming .

## 2019-07-10 ENCOUNTER — Ambulatory Visit
Admission: RE | Admit: 2019-07-10 | Discharge: 2019-07-10 | Disposition: A | Discharge status: Home / Self Care | Payer: Medicare Other | Source: Ambulatory Visit | Attending: Internal Medicine | Admitting: Internal Medicine

## 2019-07-10 ENCOUNTER — Other Ambulatory Visit: Payer: Self-pay

## 2019-07-10 DIAGNOSIS — Z1231 Encounter for screening mammogram for malignant neoplasm of breast: Secondary | ICD-10-CM | POA: Diagnosis not present

## 2019-07-12 DIAGNOSIS — H04123 Dry eye syndrome of bilateral lacrimal glands: Secondary | ICD-10-CM | POA: Diagnosis not present

## 2019-07-12 DIAGNOSIS — Z961 Presence of intraocular lens: Secondary | ICD-10-CM | POA: Diagnosis not present

## 2019-07-12 DIAGNOSIS — H524 Presbyopia: Secondary | ICD-10-CM | POA: Diagnosis not present

## 2019-07-12 DIAGNOSIS — H401232 Low-tension glaucoma, bilateral, moderate stage: Secondary | ICD-10-CM | POA: Diagnosis not present

## 2019-07-14 ENCOUNTER — Other Ambulatory Visit: Payer: Self-pay | Admitting: Internal Medicine

## 2019-07-16 ENCOUNTER — Telehealth: Payer: Self-pay

## 2019-07-16 NOTE — Telephone Encounter (Signed)
Called patient. She forgot appt for Intake was today. Rescheduled for tomorrow 3/2 at 11am.

## 2019-07-17 NOTE — Progress Notes (Signed)
[place] Report   Patient Details  Name: Marie Kelly MRN: [id number] Date of Birth: 1945/07/03 Age: 74 y.o. PCP: Burnis Medin, MD  Vitals:   07/17/19 1148  BP: 118/70  Pulse: (!) 55  Resp: 16  SpO2: 98%  Weight: 201 lb (91.2 kg)  Height: 5\' 2"  (1.575 m)     [facility] Eval - 07/17/19 1100      Referral    Referring Provider  [facility]    Reason for referral  Inactivity;Obesitity/Overweight    Program Start Date  07/24/19      Measurement   Waist Circumference  45.5 inches    Hip Circumference  48 inches    Body fat  48 percent      Information for Trainer   Goals  Exercise routine, walking 2 x week for 30 min    Current Exercise  minimal walking    Orthopedic Concerns  none    Pertinent Medical History  HTN, Hypothyroid, bigemny    Current Barriers  none    Medications that affect exercise  Asthma inhaler      Timed Up and Go (TUGS)   Timed Up and Go  Low risk <9 seconds      Mobility and Daily Activities   I find it easy to walk up or down two or more flights of stairs.  1    I have no trouble taking out the trash.  4    I do housework such as vacuuming and dusting on my own without difficulty.  4    I can easily lift a gallon of milk (8lbs).  4    I can easily walk a mile.  2    I have no trouble reaching into high cupboards or reaching down to pick up something from the floor.  2    I do not have trouble doing out-door work such as Armed forces logistics/support/administrative officer, raking leaves, or gardening.  4      Mobility and Daily Activities   I feel younger than my age.  4    I feel independent.  4    I feel energetic.  3    I live an active life.   4    I feel strong.  4    I feel healthy.  4    I feel active as other people my age.  4      How fit and strong are you.   Fit and Strong Total Score  48      Past Medical History:  Diagnosis Date  . Anemia    pt. denies  . Arthritis   . Asthma   . 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 Colon polyps   . Complication of anesthesia    2 days after surgery-muscles hurtand spasmed-needed a muscle relaxant   . Diverticulosis   . Fracture of right olecranon process 10/26/2013  . GERD (gastroesophageal reflux disease)    had endo? taking nexium 3 x per week  . Hyperlipidemia    had been on lipitor changes to pravachol cause of cost and tricor has since stopped  NO  se reported   . Hypertension   . Hypothyroidism     on meds for years  . Impaired fasting glucose   . Olecranon fracture 10/25/2013   right arm  . PONV (postoperative nausea and vomiting)   . Ventricular bigeminy seen on  cardiac monitor    Cardiac work up Dr Stanford Breed [facility]  . Wears dentures    top-partial bottom  . Wears glasses    Past Surgical History:  Procedure Laterality Date  . BELPHAROPTOSIS REPAIR Bilateral   . CATARACT EXTRACTION Bilateral 11/2016  . CHOLECYSTECTOMY    . COLONOSCOPY    . HARVEST BONE GRAFT Right 04/19/2014   Procedure: HARVEST LEFT TIBIA BONE GRAFT ;  Surgeon: Johnny Bridge, MD;  Location: [place];  Service: Orthopedics;  Laterality: Right;  . HEMORROIDECTOMY    . KNEE ARTHROSCOPY Right   . NASAL SINUS SURGERY    . ORIF ELBOW FRACTURE Right 10/26/2013   Procedure: RIGHT ELBOW FRACTUE OPEN TREATMENT ULNAR PROXIMAL END OLECRANON PROCESS INCLUDES INTERNAL FIXATION;  Surgeon: Johnny Bridge, MD;  Location: [place];  Service: Orthopedics;  Laterality: Right;  . ORIF ELBOW FRACTURE Right 04/19/2014   Procedure: OPEN REDUCTION INTERNAL FIXATION (ORIF) RIGHT ELBOW/OLECRANON NONUNION FRACTURE WITH HARDWARE REMOVAL;  Surgeon: Johnny Bridge, MD;  Location: [place];  Service: Orthopedics;  Laterality: Right;  . TONSILLECTOMY    . TUBAL LIGATION    . uterus repair     Social History   Tobacco Use  Smoking Status Former Smoker  . Quit date: 10/25/1973  . Years since quitting: 45.7  Smokeless  Tobacco Never Used   Intake completed today for PREP class starting on 07/24/2019  Tues/Thur Class from [id number] x 12 wks       Pam Tally Joe 07/17/2019, 11:53 AM

## 2019-07-23 ENCOUNTER — Ambulatory Visit (INDEPENDENT_AMBULATORY_CARE_PROVIDER_SITE_OTHER): Payer: Medicare Other

## 2019-07-23 ENCOUNTER — Other Ambulatory Visit: Payer: Self-pay

## 2019-07-23 ENCOUNTER — Other Ambulatory Visit: Payer: Medicare Other

## 2019-07-23 DIAGNOSIS — Z8616 Personal history of COVID-19: Secondary | ICD-10-CM | POA: Diagnosis not present

## 2019-07-23 DIAGNOSIS — U071 COVID-19: Secondary | ICD-10-CM | POA: Diagnosis not present

## 2019-07-23 DIAGNOSIS — J1282 Pneumonia due to coronavirus disease 2019: Secondary | ICD-10-CM | POA: Diagnosis not present

## 2019-07-23 NOTE — Progress Notes (Signed)
Chest x ray is now  better  no acute findings that need follow up . (Some probable old scarring  ) NO further x ray needed

## 2019-07-24 NOTE — Progress Notes (Signed)
[facility] PREP Weekly Session   Patient Details  Name: Marie Kelly MRN: [id number] Date of Birth: 1945/05/21 Age: 74 y.o. PCP: Burnis Medin, MD  There were no vitals filed for this visit.  [facility] Weekly seesion - 07/24/19 2000      Weekly Session   Topic Discussed  --   scale of perceived exertion   Comments  Oriented to gym        [name] 07/24/2019, 8:06 PM

## 2019-07-31 NOTE — Progress Notes (Signed)
[facility] PREP Weekly Session   Patient Details  Name: Marie Kelly MRN: [id number] Date of Birth: Mar 21, 1946 Age: 74 y.o. PCP: Burnis Medin, MD  Vitals:   07/31/19 1615  Weight: 203 lb 12.8 oz (92.4 kg)    [facility] Weekly seesion - 07/31/19 1600      Weekly Session   Topic Discussed  Importance of resistance training;Other ways to be active    Minutes exercised this week  180 minutes    Classes attended to date  3      Fun things since last meeting: went to [facility] with family  Grateful for: grandkids, family, friends Nutrition celebration: watched what I ate but gained this week Barriers/Struggles: snacking at night   [name] 07/31/2019, 4:16 PM

## 2019-08-07 NOTE — Progress Notes (Signed)
[facility] PREP Weekly Session   Patient Details  Name: Marie Kelly MRN: [id number] Date of Birth: Jul 22, 1945 Age: 74 y.o. PCP: Burnis Medin, MD  There were no vitals filed for this visit.  [facility] Weekly seesion - 08/07/19 1500      Weekly Session   Topic Discussed  Healthy eating tips   Hidden sugars/circuit taught   Minutes exercised this week  90 minutes    Classes attended to date  5      Fun things: lunch with a friend Grateful for: granddaughter helped clean my house sat Nutrition Celebration: trying to watch what I eat but gaining Barriers: my weight, snacking at night still    [name] 08/07/2019, 3:58 PM

## 2019-08-13 NOTE — Telephone Encounter (Signed)
PH levels of blood are not a routine test so not done   In generally well people  the direct measurement of ph level in blood is not clinically helpful and  Thus not performed. .   However, the chemistry panel tells  [place] if  Acid- base balance is out of range  And yours is normal.( look at the CO2  Level in the BMP )  The  Kidneys and  Breathing centers of the  Brain  Keep these levels in narrow check   If all is well.   PH levels in blood are checked with venous or arterial blood usually with persons in ICU  Care   Or ED When people are quite ill. Or having serious  Respiratory disease among other certain conditions.   If this doesn't answer  your  Questions then arrange  a video  visit  To discuss concerns .

## 2019-08-21 NOTE — Progress Notes (Signed)
[facility] PREP Weekly Session   Patient Details  Name: Marie Kelly MRN: [id number] Date of Birth: [birth year] Age: 74 y.o. PCP: Burnis Medin, MD  Vitals:   08/21/19 1530  Weight: 208 lb 3.2 oz (94.4 kg)    [facility] Weekly seesion - 08/21/19 1500      Weekly Session   Topic Discussed  --   Held due to class size was low   Minutes exercised this week  15 minutes    Classes attended to date  7      Fun things since last meeting: visiting family/friends/ate at fav rest[employer]ant Grateful for: family, safe trip home Nutrition celebration: could have had more deserts-had a small piece of cheesecake, and dunkin donut jelly donut Barrier/struggles: did not want to binge eat at night in [place] (weird)    [name] 08/21/2019, 3:32 PM

## 2019-08-28 NOTE — Progress Notes (Signed)
[facility] PREP Weekly Session   Patient Details  Name: Marie Kelly MRN: [id number] Date of Birth: 1945/12/17 Age: 74 y.o. PCP: Burnis Medin, MD  Vitals:   08/28/19 1559  Weight: 203 lb (92.1 kg)    [facility] Weekly seesion - 08/28/19 1500      Weekly Session   Topic Discussed  Restaurant Eating   sugar demo   Minutes exercised this week  120 minutes    Classes attended to date  36      Fun things since last meeting: shopped for organizing things Grateful for: family, health Nutrition celebrations: trying hard to lose weight Barriers/struggles: snacking at night   Marie Kelly 08/28/2019, 4:00 PM

## 2019-09-11 NOTE — Progress Notes (Signed)
[facility] PREP Weekly Session   Patient Details  Name: Marie Kelly MRN: [id number] Date of Birth: 06-05-1945 Age: 74 y.o. PCP: Burnis Medin, MD  Vitals:   09/11/19 1612  Weight: 207 lb (93.9 kg)    [facility] Weekly seesion - 09/11/19 1600      Weekly Session   Topic Discussed  Expectations and non-scale victories   clean 15 and dirty dozen   Minutes exercised this week  180 minutes    Classes attended to date  77      Fun things since last meeting: birthday dinner last night Grateful for: health and friends Beautiful day today Nutrition celebration: eat out at [place] rest last night, scale shows Barriers/struggles: weight/diet, back hurt this week   [name] 09/11/2019, 4:13 PM

## 2019-09-18 NOTE — Progress Notes (Signed)
[facility] PREP Weekly Session   Patient Details  Name: Garla Gassert MRN: [id number] Date of Birth: [birth year] Age: 74 y.o. PCP: Burnis Medin, MD  Vitals:   09/18/19 1612  Weight: 206 lb (93.4 kg)    [facility] Weekly seesion - 09/18/19 1600      Weekly Session   Topic Discussed  --   portion control, reading labels   Minutes exercised this week  120 minutes    Classes attended to date  14     Good discussion about portion control and watching labels. Eat real food, avoid processed.  C/O low back pain. Saw Chiro today. Either from weight work or working on pool. Will need to break from exercise Thursday.  Fun things since last meeting: birthday party on Sat for [place], family/weather/friends Nutrition celebration: trying to watch what I eat Barriers/struggles: strained muscle in back/spasms-hurts   [name] 09/18/2019, 4:13 PM

## 2019-09-25 NOTE — Progress Notes (Signed)
[facility] PREP Weekly Session   Patient Details  Name: Marie Kelly MRN: [id number] Date of Birth: [birth year] Age: 74 y.o. PCP: Burnis Medin, MD  Vitals:   09/25/19 1604  Weight: 207 lb (93.9 kg)    [facility] Weekly seesion - 09/25/19 1600      Weekly Session   Topic Discussed  Finding support    Minutes exercised this week  0 minutes   back injury   Classes attended to date  39     Fun things since last meeting: nursing sprained muscle Grateful for: back getting better Barriers/struggles: sprained muscle.    [name] 09/25/2019, 4:05 PM

## 2019-10-02 NOTE — Progress Notes (Signed)
[facility] PREP Weekly Session   Patient Details  Name: Marie Kelly MRN: [id number] Date of Birth: [birth year] Age: 74 y.o. PCP: Burnis Medin, MD  Vitals:   10/02/19 1639  Weight: 203 lb (92.1 kg)    [facility] Weekly seesion - 10/02/19 1600      Weekly Session   Topic Discussed  Calorie breakdown    Minutes exercised this week  30 minutes    Classes attended to date  22      Fun things since last meeting: worked in garden Grateful for : family, back better Nutrition celebration: started [name] Keto on 09/27/19 Barriers/struggles: back finally better gaining control of food   [name] 10/02/2019, 4:41 PM

## 2019-10-08 ENCOUNTER — Ambulatory Visit: Payer: Medicare Other | Attending: Internal Medicine

## 2019-10-08 DIAGNOSIS — Z20822 Contact with and (suspected) exposure to covid-19: Secondary | ICD-10-CM | POA: Diagnosis not present

## 2019-10-09 LAB — SARS-COV-2, NAA 2 DAY TAT

## 2019-10-09 LAB — NOVEL CORONAVIRUS, NAA: SARS-CoV-2, NAA: NOT DETECTED

## 2019-10-11 NOTE — Progress Notes (Signed)
[place] Report   Patient Details  Name: Marie Kelly MRN: [id number] Date of Birth: March 04, 1946 Age: 74 y.o. PCP: Burnis Medin, MD  Vitals:   10/11/19 1528  BP: 128/66  Pulse: 66  SpO2: 96%  Weight: 198 lb 9.6 oz (90.1 kg)     [facility] Eval - 10/11/19 1500      Referral    Referring Provider  [facility]    Reason for referral  Inactivity;Obesitity/Overweight    Program Start Date  07/24/19      Measurement   Waist Circumference  45 inches    Hip Circumference  48 inches    Body fat  47.3 percent      Mobility and Daily Activities   I find it easy to walk up or down two or more flights of stairs.  2    I have no trouble taking out the trash.  4    I do housework such as vacuuming and dusting on my own without difficulty.  4    I can easily lift a gallon of milk (8lbs).  4    I can easily walk a mile.  4    I have no trouble reaching into high cupboards or reaching down to pick up something from the floor.  4    I do not have trouble doing out-door work such as Armed forces logistics/support/administrative officer, raking leaves, or gardening.  4      Mobility and Daily Activities   I feel younger than my age.  4    I feel independent.  4    I feel energetic.  4    I live an active life.   4    I feel strong.  4    I feel healthy.  4    I feel active as other people my age.  4      How fit and strong are you.   Fit and Strong Total Score  54      Past Medical History:  Diagnosis Date  . Anemia    pt. denies  . Arthritis   . Asthma   . 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 Colon polyps   . Complication of anesthesia    2 days after surgery-muscles hurtand spasmed-needed a muscle relaxant   . Diverticulosis   . Fracture of right olecranon process 10/26/2013  . GERD (gastroesophageal reflux disease)    had endo? taking nexium 3 x per week  . Hyperlipidemia    had been on lipitor changes to pravachol cause of cost and tricor  has since stopped  NO  se reported   . Hypertension   . Hypothyroidism     on meds for years  . Impaired fasting glucose   . Olecranon fracture 10/25/2013   right arm  . PONV (postoperative nausea and vomiting)   . Ventricular bigeminy seen on cardiac monitor    Cardiac work up Dr Stanford Breed [facility]  . Wears dentures    top-partial bottom  . Wears glasses    Past Surgical History:  Procedure Laterality Date  . BELPHAROPTOSIS REPAIR Bilateral   . CATARACT EXTRACTION Bilateral 11/2016  . CHOLECYSTECTOMY    . COLONOSCOPY    . HARVEST BONE GRAFT Right 04/19/2014   Procedure: HARVEST LEFT TIBIA BONE GRAFT ;  Surgeon: Johnny Bridge, MD;  Location: [place];  Service: Orthopedics;  Laterality: Right;  .  HEMORROIDECTOMY    . KNEE ARTHROSCOPY Right   . NASAL SINUS SURGERY    . ORIF ELBOW FRACTURE Right 10/26/2013   Procedure: RIGHT ELBOW FRACTUE OPEN TREATMENT ULNAR PROXIMAL END OLECRANON PROCESS INCLUDES INTERNAL FIXATION;  Surgeon: Johnny Bridge, MD;  Location: [place];  Service: Orthopedics;  Laterality: Right;  . ORIF ELBOW FRACTURE Right 04/19/2014   Procedure: OPEN REDUCTION INTERNAL FIXATION (ORIF) RIGHT ELBOW/OLECRANON NONUNION FRACTURE WITH HARDWARE REMOVAL;  Surgeon: Johnny Bridge, MD;  Location: [place];  Service: Orthopedics;  Laterality: Right;  . TONSILLECTOMY    . TUBAL LIGATION    . uterus repair     Social History   Tobacco Use  Smoking Status Former Smoker  . Quit date: 10/25/1973  . Years since quitting: 45.9  Smokeless Tobacco Never Used   Improvements in cardio march test and arm curls specifically. New goals of 5 day a week exercise. Plans on joining gym when back from vacation      [name] 10/11/2019, 3:31 PM

## 2019-10-13 ENCOUNTER — Other Ambulatory Visit: Payer: Self-pay | Admitting: Internal Medicine

## 2019-11-05 ENCOUNTER — Emergency Department (INDEPENDENT_AMBULATORY_CARE_PROVIDER_SITE_OTHER)
Admission: RE | Admit: 2019-11-05 | Discharge: 2019-11-05 | Disposition: A | Discharge status: Home / Self Care | Payer: Medicare Other | Source: Ambulatory Visit

## 2019-11-05 ENCOUNTER — Other Ambulatory Visit: Payer: Self-pay

## 2019-11-05 VITALS — BP 129/80 | HR 59 | Temp 97.5°F | Ht 63.0 in | Wt 192.0 lb

## 2019-11-05 DIAGNOSIS — H01004 Unspecified blepharitis left upper eyelid: Secondary | ICD-10-CM | POA: Diagnosis not present

## 2019-11-05 DIAGNOSIS — R220 Localized swelling, mass and lump, head: Secondary | ICD-10-CM

## 2019-11-05 DIAGNOSIS — T7840XA Allergy, unspecified, initial encounter: Secondary | ICD-10-CM | POA: Diagnosis not present

## 2019-11-05 DIAGNOSIS — H01001 Unspecified blepharitis right upper eyelid: Secondary | ICD-10-CM

## 2019-11-05 MED ORDER — PREDNISONE 20 MG PO TABS: 20.0000 mg | ORAL_TABLET | Freq: Every day | ORAL | 0 refills | Status: AC

## 2019-11-05 MED ORDER — DEXAMETHASONE SODIUM PHOSPHATE 10 MG/ML IJ SOLN
10.0000 mg | Freq: Once | INTRAMUSCULAR | Status: AC
  Administered 2019-11-05: 10 mg via INTRAMUSCULAR

## 2019-11-05 MED ORDER — CETIRIZINE HCL 5 MG PO TABS: 5.0000 mg | ORAL_TABLET | Freq: Every day | ORAL | 0 refills | Status: DC

## 2019-11-05 NOTE — Discharge Instructions (Signed)
  You were given a shot of decadron (a steroid) today to help with itching and swelling from a likely allergic reaction.  You have been prescribed 5 days of prednisone, an oral steroid.  You may start this medication tomorrow with breakfast.    You may also use over the counter hydrocortisone cream to help with itching and inflammation but use caution around your eyes.  Follow up in 4-5 days if not improving, sooner if significantly worsening.

## 2019-11-05 NOTE — ED Triage Notes (Signed)
Rash on face eyes swollen and red itchy, neck, arms. Opened new Makeup which she has used before.

## 2019-11-05 NOTE — ED Provider Notes (Signed)
[name]    CSN: [id number] Arrival date & time: 11/05/19  1124      History   Chief Complaint Chief Complaint  Patient presents with  . Rash    HPI Marie Kelly is a 74 y.o. female.   HPI  Marie Kelly is a 74 y.o. female presenting to UC with c/o gradually worsening redness, itching and swelling of her eyelids and around her eyes and neck that started yesterday after using a newly purchased makeup. She has used the same type of makeup in the past w/o reaction.  She did take an expired benadryl without relief. Denies oral swelling or trouble breathing.  No other new soaps, lotions or medications.  She has done well with prednisone in the past for a poison ivy rash.    Past Medical History:  Diagnosis Date  . Anemia    pt. denies  . Arthritis   . Asthma   . 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 Colon polyps   . Complication of anesthesia    2 days after surgery-muscles hurtand spasmed-needed a muscle relaxant   . Diverticulosis   . Fracture of right olecranon process 10/26/2013  . GERD (gastroesophageal reflux disease)    had endo? taking nexium 3 x per week  . Hyperlipidemia    had been on lipitor changes to pravachol cause of cost and tricor has since stopped  NO  se reported   . Hypertension   . Hypothyroidism     on meds for years  . Impaired fasting glucose   . Olecranon fracture 10/25/2013   right arm  . PONV (postoperative nausea and vomiting)   . Ventricular bigeminy seen on cardiac monitor    Cardiac work up Dr Stanford Breed [facility]  . Wears dentures    top-partial bottom  . Wears glasses     Patient Active Problem List   Diagnosis Date Noted  . COVID-19 virus infection 05/14/2019  . Essential hypertension 10/23/2014  . Bigeminy 10/23/2014  . Elevated BP 09/20/2014  . Fracture of right olecranon process 10/26/2013  . Asthma   . Anemia   . Diverticulosis   . Colon polyps   . Ventricular  bigeminy seen on cardiac monitor   . PVC's (premature ventricular contractions) 06/01/2013  . Irregular heart rate 04/25/2013  . Arthritis of right knee 04/25/2013  . Osteoporosis hx ?of on fosamax  03/11/2012  . Elevated blood pressure reading 09/04/2011  . Obesity 07/04/2011  . Early stage glaucoma 07/04/2011  . History of osteopenia 06/30/2011  . Asthma 06/29/2011  . Hormone replacement therapy (postmenopausal) 06/29/2011  . Lumpy breasts 06/29/2011  . Urinary urgency 06/29/2011  . Hypothyroidism   . Impaired fasting glucose   . Hyperlipidemia   . GERD (gastroesophageal reflux disease)     Past Surgical History:  Procedure Laterality Date  . BELPHAROPTOSIS REPAIR Bilateral   . CATARACT EXTRACTION Bilateral 11/2016  . CHOLECYSTECTOMY    . COLONOSCOPY    . HARVEST BONE GRAFT Right 04/19/2014   Procedure: HARVEST LEFT TIBIA BONE GRAFT ;  Surgeon: Johnny Bridge, MD;  Location: [place];  Service: Orthopedics;  Laterality: Right;  . HEMORROIDECTOMY    . KNEE ARTHROSCOPY Right   . NASAL SINUS SURGERY    . ORIF ELBOW FRACTURE Right 10/26/2013   Procedure: RIGHT ELBOW FRACTUE OPEN TREATMENT ULNAR PROXIMAL END OLECRANON PROCESS INCLUDES INTERNAL FIXATION;  Surgeon: Johnny Bridge, MD;  Location:  [place];  Service: Orthopedics;  Laterality: Right;  . ORIF ELBOW FRACTURE Right 04/19/2014   Procedure: OPEN REDUCTION INTERNAL FIXATION (ORIF) RIGHT ELBOW/OLECRANON NONUNION FRACTURE WITH HARDWARE REMOVAL;  Surgeon: Johnny Bridge, MD;  Location: [place];  Service: Orthopedics;  Laterality: Right;  . TONSILLECTOMY    . TUBAL LIGATION    . uterus repair      OB History   No obstetric history on file.      Home Medications    Prior to Admission medications   Medication Sig Start Date End Date Taking? Authorizing Provider  albuterol (PROVENTIL HFA;VENTOLIN HFA) 108 (90 Base) MCG/ACT inhaler Inhale 2 puffs into the lungs every 6  (six) hours as needed for wheezing or shortness of breath. 06/02/17 06/02/18  Panosh, Standley Brooking, MD  albuterol (PROVENTIL) (2.5 MG/3ML) 0.083% nebulizer solution Take 3 mLs (2.5 mg total) by nebulization every 6 (six) hours as needed for wheezing or shortness of breath. 05/17/19   Fransico Meadow, PA-C  bimatoprost (LUMIGAN) 0.01 % SOLN Place 1 drop into both eyes at bedtime.    [provider]  carvedilol (COREG) 6.25 MG tablet TAKE 1 TABLET IN THE MORNING AND 2 TABLETS IN THE EVENING 10/16/19   Panosh, Standley Brooking, MD  cetirizine (ZYRTEC) 5 MG tablet Take 1 tablet (5 mg total) by mouth daily. 11/05/19   Noe Gens, PA-C  Fluticasone-Salmeterol (ADVAIR) 250-50 MCG/DOSE AEPB USE 1 INHALATION TWICE A DAY 07/16/19   Panosh, Standley Brooking, MD  levothyroxine (SYNTHROID) 50 MCG tablet TAKE 1 TABLET DAILY (NEED APPOINTMENT NO FURTHER REFILLS UNTIL SEEN) 05/10/19   Panosh, Standley Brooking, MD  meloxicam (MOBIC) 15 MG tablet Take 15 mg by mouth daily as needed for pain.    [provider]  montelukast (SINGULAIR) 10 MG tablet TAKE 1 TABLET AT BEDTIME 12/20/18   Panosh, Standley Brooking, MD  predniSONE (DELTASONE) 20 MG tablet Take 1 tablet (20 mg total) by mouth daily with breakfast for 5 days. 11/05/19 11/10/19  Noe Gens, PA-C  telmisartan (MICARDIS) 80 MG tablet TAKE 1 TABLET DAILY 01/16/19   Panosh, Standley Brooking, MD  tolterodine (DETROL LA) 2 MG 24 hr capsule TAKE 1 CAPSULE DAILY 12/20/18   Panosh, Standley Brooking, MD  valACYclovir (VALTREX) 1000 MG tablet  06/26/18   [provider]    Family History Family History  Problem Relation Age of Onset  . Stroke Mother   . Diabetes Father   . Heart disease Father   . Alzheimer's disease Other   . Stomach cancer Maternal Aunt   . Colon cancer Neg Hx   . Pancreatic cancer Neg Hx     Social History Social History   Tobacco Use  . Smoking status: Former Smoker    Quit date: 10/25/1973    Years since quitting: 46.0  . Smokeless tobacco: Never Used  Vaping Use  .  Vaping Use: Never used  Substance Use Topics  . Alcohol use: Yes    Alcohol/week: 0.0 standard drinks    Comment: socially  . Drug use: No     Allergies   Hydrochlorothiazide   Review of Systems Review of Systems  HENT: Positive for facial swelling.   Eyes: Negative for photophobia, pain, discharge, redness and visual disturbance.       Eyelid redness  Respiratory: Negative for chest tightness, shortness of breath and wheezing.   Skin: Positive for rash.     Physical Exam Triage Vital Signs ED Triage  Vitals [11/05/19 1136]  Enc Vitals Group     BP 129/80     Pulse Rate (!) 59     Resp      Temp (!) 97.5 F (36.4 C)     Temp Source Oral     SpO2 99 %     Weight 192 lb (87.1 kg)     Height 5\' 3"  (1.6 m)     Head Circumference      Peak Flow      Pain Score 0     Pain Loc      Pain Edu?      Excl. in [place]?    No data found.  Updated Vital Signs BP 129/80 (BP Location: Right Arm)   Pulse (!) 59   Temp (!) 97.5 F (36.4 C) (Oral)   Ht 5\' 3"  (1.6 m)   Wt 192 lb (87.1 kg)   SpO2 99%   BMI 34.01 kg/m   Visual Acuity Right Eye Distance:   Left Eye Distance:   Bilateral Distance:    Right Eye Near:   Left Eye Near:    Bilateral Near:     Physical Exam Vitals and nursing note reviewed.  Constitutional:      Appearance: Normal appearance. She is well-developed.  HENT:     Head: Normocephalic and atraumatic.      Comments: Erythematous macular rash. Non-tender. Mild edema of upper eyelids.  Cardiovascular:     Rate and Rhythm: Normal rate and regular rhythm.  Pulmonary:     Effort: Pulmonary effort is normal. No respiratory distress.     Breath sounds: Normal breath sounds.  Musculoskeletal:        General: Normal range of motion.     Cervical back: Normal range of motion.  Skin:    General: Skin is warm and dry.  Neurological:     Mental Status: She is alert and oriented to person, place, and time.  Psychiatric:        Behavior: Behavior normal.       UC Treatments / Results  Labs (all labs ordered are listed, but only abnormal results are displayed) Labs Reviewed - No data to display  EKG   Radiology No results found.  Procedures Procedures (including critical care time)  Medications Ordered in UC Medications  dexamethasone (DECADRON) injection 10 mg (10 mg Intramuscular Given 11/05/19 1209)    Initial Impression / Assessment and Plan / UC Course  I have reviewed the triage vital signs and the nursing notes.  Pertinent labs & imaging results that were available during my care of the patient were reviewed by me and considered in my medical decision making (see chart for details).     Hx and exam c/w localized allergic reaction  Pt agreeable to steroid tx Discussed symptoms that warrant emergent care in the ED. AVS provided  Final Clinical Impressions(s) / UC Diagnoses   Final diagnoses:  Allergic reaction, initial encounter  Facial swelling  Blepharitis of upper eyelids of both eyes, unspecified type     Discharge Instructions      You were given a shot of decadron (a steroid) today to help with itching and swelling from a likely allergic reaction.  You have been prescribed 5 days of prednisone, an oral steroid.  You may start this medication tomorrow with breakfast.    You may also use over the counter hydrocortisone cream to help with itching and inflammation but use caution around your eyes.  Follow  up in 4-5 days if not improving, sooner if significantly worsening.      ED Prescriptions    Medication Sig Dispense Auth. Provider   predniSONE (DELTASONE) 20 MG tablet Take 1 tablet (20 mg total) by mouth daily with breakfast for 5 days. 5 tablet Leeroy Cha O, PA-C   cetirizine (ZYRTEC) 5 MG tablet Take 1 tablet (5 mg total) by mouth daily. 30 tablet Noe Gens, [place]     PDMP not reviewed this encounter.   Noe Gens, [place] 11/05/19 1524

## 2019-11-16 DIAGNOSIS — L239 Allergic contact dermatitis, unspecified cause: Secondary | ICD-10-CM | POA: Diagnosis not present

## 2019-12-17 ENCOUNTER — Other Ambulatory Visit: Payer: Self-pay | Admitting: Internal Medicine

## 2019-12-25 NOTE — Telephone Encounter (Signed)
So bmi of 27 would be  Around 150  Not unreasonable for over  Age 74   140 would be a bmi of 25  Normal   Glad   Your are getting success.Marland KitchenMarland Kitchen

## 2020-01-11 ENCOUNTER — Other Ambulatory Visit: Payer: Self-pay | Admitting: Internal Medicine

## 2020-01-22 ENCOUNTER — Encounter (INDEPENDENT_AMBULATORY_CARE_PROVIDER_SITE_OTHER): Payer: Medicare Other | Admitting: Ophthalmology

## 2020-01-30 DIAGNOSIS — H401232 Low-tension glaucoma, bilateral, moderate stage: Secondary | ICD-10-CM | POA: Diagnosis not present

## 2020-01-30 DIAGNOSIS — H02054 Trichiasis without entropian left upper eyelid: Secondary | ICD-10-CM | POA: Diagnosis not present

## 2020-01-30 DIAGNOSIS — H26492 Other secondary cataract, left eye: Secondary | ICD-10-CM | POA: Diagnosis not present

## 2020-01-30 DIAGNOSIS — H16223 Keratoconjunctivitis sicca, not specified as Sjogren's, bilateral: Secondary | ICD-10-CM | POA: Diagnosis not present

## 2020-02-07 ENCOUNTER — Encounter (INDEPENDENT_AMBULATORY_CARE_PROVIDER_SITE_OTHER): Payer: Medicare Other | Admitting: Ophthalmology

## 2020-02-11 ENCOUNTER — Ambulatory Visit (INDEPENDENT_AMBULATORY_CARE_PROVIDER_SITE_OTHER): Payer: Medicare Other | Admitting: Ophthalmology

## 2020-02-11 ENCOUNTER — Encounter (INDEPENDENT_AMBULATORY_CARE_PROVIDER_SITE_OTHER): Payer: Self-pay | Admitting: Ophthalmology

## 2020-02-11 ENCOUNTER — Other Ambulatory Visit: Payer: Self-pay

## 2020-02-11 DIAGNOSIS — H353131 Nonexudative age-related macular degeneration, bilateral, early dry stage: Secondary | ICD-10-CM | POA: Diagnosis not present

## 2020-02-11 DIAGNOSIS — H35431 Paving stone degeneration of retina, right eye: Secondary | ICD-10-CM | POA: Diagnosis not present

## 2020-02-11 DIAGNOSIS — H43813 Vitreous degeneration, bilateral: Secondary | ICD-10-CM

## 2020-02-11 DIAGNOSIS — H33101 Unspecified retinoschisis, right eye: Secondary | ICD-10-CM | POA: Diagnosis not present

## 2020-02-11 DIAGNOSIS — H35361 Drusen (degenerative) of macula, right eye: Secondary | ICD-10-CM | POA: Diagnosis not present

## 2020-02-11 DIAGNOSIS — H401131 Primary open-angle glaucoma, bilateral, mild stage: Secondary | ICD-10-CM

## 2020-02-11 DIAGNOSIS — H26492 Other secondary cataract, left eye: Secondary | ICD-10-CM | POA: Diagnosis not present

## 2020-02-11 DIAGNOSIS — H35371 Puckering of macula, right eye: Secondary | ICD-10-CM

## 2020-02-11 NOTE — Assessment & Plan Note (Signed)

## 2020-02-11 NOTE — Patient Instructions (Signed)
To report any new onset visual acuity declines or distortions

## 2020-02-11 NOTE — Progress Notes (Signed)
02/11/2020     CHIEF COMPLAINT Patient presents for Retina Follow Up   HISTORY OF PRESENT ILLNESS: Marie Kelly is a 74 y.o. female who presents to the clinic today for:   HPI    Retina Follow Up    Patient presents with  Dry AMD.  In both eyes.  Severity is moderate.  Duration of 1 year.  Since onset it is stable.  I, the attending physician,  performed the HPI with the patient and updated documentation appropriately.          Comments    1Year Dry AMD f\u OU. OCT  Pt c/o OU being very dry. Pt states she had COVID in December and the dry eyes started after that. Pt using gtts as directed.       Last edited by Tilda Franco on 02/11/2020  8:48 AM. (History)      Referring physician: Burnis Medin, MD [place],  [place] [address]  HISTORICAL INFORMATION:   Selected notes from the MEDICAL RECORD NUMBER    Lab Results  Component Value Date   HGBA1C 6.1 06/11/2019     CURRENT MEDICATIONS: Current Outpatient Medications (Ophthalmic Drugs)  Medication Sig  . bimatoprost (LUMIGAN) 0.01 % SOLN Place 1 drop into both eyes at bedtime.   No current facility-administered medications for this visit. (Ophthalmic Drugs)   Current Outpatient Medications (Other)  Medication Sig  . albuterol (PROVENTIL HFA;VENTOLIN HFA) 108 (90 Base) MCG/ACT inhaler Inhale 2 puffs into the lungs every 6 (six) hours as needed for wheezing or shortness of breath.  Marland Kitchen albuterol (PROVENTIL) (2.5 MG/3ML) 0.083% nebulizer solution Take 3 mLs (2.5 mg total) by nebulization every 6 (six) hours as needed for wheezing or shortness of breath.  . carvedilol (COREG) 6.25 MG tablet TAKE 1 TABLET IN THE MORNING AND 2 TABLETS IN THE EVENING  . cetirizine (ZYRTEC) 5 MG tablet Take 1 tablet (5 mg total) by mouth daily.  . Fluticasone-Salmeterol (ADVAIR) 250-50 MCG/DOSE AEPB USE 1 INHALATION TWICE A DAY  . levothyroxine (SYNTHROID) 50 MCG tablet TAKE 1 TABLET DAILY (NEED APPOINTMENT NO  FURTHER REFILLS UNTIL SEEN)  . meloxicam (MOBIC) 15 MG tablet Take 15 mg by mouth daily as needed for pain.  . montelukast (SINGULAIR) 10 MG tablet TAKE 1 TABLET AT BEDTIME  . telmisartan (MICARDIS) 80 MG tablet TAKE 1 TABLET DAILY  . tolterodine (DETROL LA) 2 MG 24 hr capsule TAKE 1 CAPSULE DAILY  . valACYclovir (VALTREX) 1000 MG tablet    No current facility-administered medications for this visit. (Other)      REVIEW OF SYSTEMS:    ALLERGIES Allergies  Allergen Reactions  . Hydrochlorothiazide     Muscles tightness and felt like she was hit by a truck    PAST MEDICAL HISTORY Past Medical History:  Diagnosis Date  . Anemia    pt. denies  . Arthritis   . Asthma   . 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 Colon polyps   . Complication of anesthesia    2 days after surgery-muscles hurtand spasmed-needed a muscle relaxant   . Diverticulosis   . Fracture of right olecranon process 10/26/2013  . GERD (gastroesophageal reflux disease)    had endo? taking nexium 3 x per week  . Hyperlipidemia    had been on lipitor changes to pravachol cause of cost and tricor has since stopped  NO  se reported   . Hypertension   .  Hypothyroidism     on meds for years  . Impaired fasting glucose   . Olecranon fracture 10/25/2013   right arm  . PONV (postoperative nausea and vomiting)   . Ventricular bigeminy seen on cardiac monitor    Cardiac work up Dr Stanford Breed [facility]  . Wears dentures    top-partial bottom  . Wears glasses    Past Surgical History:  Procedure Laterality Date  . BELPHAROPTOSIS REPAIR Bilateral   . CATARACT EXTRACTION Bilateral 11/2016  . CHOLECYSTECTOMY    . COLONOSCOPY    . HARVEST BONE GRAFT Right 04/19/2014   Procedure: HARVEST LEFT TIBIA BONE GRAFT ;  Surgeon: Johnny Bridge, MD;  Location: [facility];  Service: Orthopedics;  Laterality: Right;  . HEMORROIDECTOMY    . KNEE ARTHROSCOPY Right   .  NASAL SINUS SURGERY    . ORIF ELBOW FRACTURE Right 10/26/2013   Procedure: RIGHT ELBOW FRACTUE OPEN TREATMENT ULNAR PROXIMAL END OLECRANON PROCESS INCLUDES INTERNAL FIXATION;  Surgeon: Johnny Bridge, MD;  Location: [facility] [facility];  Service: Orthopedics;  Laterality: Right;  . ORIF ELBOW FRACTURE Right 04/19/2014   Procedure: OPEN REDUCTION INTERNAL FIXATION (ORIF) RIGHT ELBOW/OLECRANON NONUNION FRACTURE WITH HARDWARE REMOVAL;  Surgeon: Johnny Bridge, MD;  Location: [facility];  Service: Orthopedics;  Laterality: Right;  . TONSILLECTOMY    . TUBAL LIGATION    . uterus repair      FAMILY HISTORY Family History  Problem Relation Age of Onset  . Stroke Mother   . Diabetes Father   . Heart disease Father   . Alzheimer's disease Other   . Stomach cancer Maternal Aunt   . Colon cancer Neg Hx   . Pancreatic cancer Neg Hx     SOCIAL HISTORY Social History   Tobacco Use  . Smoking status: Former Smoker    Quit date: 10/25/1973    Years since quitting: 46.3  . Smokeless tobacco: Never Used  Vaping Use  . Vaping Use: Never used  Substance Use Topics  . Alcohol use: Yes    Alcohol/week: 0.0 standard drinks    Comment: socially  . Drug use: No         OPHTHALMIC EXAM: Base Eye Exam    Visual Acuity (Snellen - Linear)      Right Left   Dist cc 20/20 -1 20/25 +   Correction: Glasses       Tonometry (Tonopen, 8:53 AM)      Right Left   Pressure 8 7       Pupils      Pupils Dark Light Shape React APD   Right PERRL 4 3 Round Brisk None   Left PERRL 4 3 Round Brisk None       Visual Fields (Counting fingers)      Left Right    Full Full       Neuro/Psych    Oriented x3: Yes   Mood/Affect: Normal       Dilation    Both eyes: 1.0% Mydriacyl, 2.5% Phenylephrine @ 8:53 AM        Slit Lamp and Fundus Exam    External Exam      Right Left   External Normal Normal       Slit Lamp Exam      Right Left   Lids/Lashes Normal Normal    Conjunctiva/Sclera White and quiet White and quiet   Cornea Clear Clear   Anterior Chamber Deep and  quiet Deep and quiet   Iris Round and reactive Round and reactive   Lens Centered posterior chamber intraocular lens, 1+ Posterior capsular opacification Centered posterior chamber intraocular lens   Anterior Vitreous Normal Normal       Fundus Exam      Right Left   Posterior Vitreous Posterior vitreous detachment Posterior vitreous detachment   Disc Peripapillary atrophy Peripapillary atrophy   C/D Ratio 0.3 0.3   Macula Normal Normal   Vessels Normal Normal   Periphery Pavingstone degeneration, inferiorly Pavingstone degeneration, inferiorly          IMAGING AND PROCEDURES  Imaging and Procedures for 02/11/20  OCT, Retina - OU - Both Eyes       Right Eye Quality was good. Scan locations included subfoveal. Central Foveal Thickness: 315. Progression has been stable. Findings include epiretinal membrane.   Left Eye Quality was good. Central Foveal Thickness: 237. Progression has been stable.   Notes Macular retina schisis, stable over the last year.                ASSESSMENT/PLAN:  Posterior vitreous detachment of both eyes   The nature of posterior vitreous detachment was discussed with the patient as well as its physiology, its age prevalence, and its possible implication regarding retinal breaks and detachment.  An informational brochure was given to the patient.  All the patient's questions were answered.  The patient was asked to return if new or different flashes or floaters develops.   Patient was instructed to contact office immediately if any changes were noticed. I explained to the patient that vitreous inside the eye is similar to jello inside a bowl. As the jello melts it can start to pull away from the bowl, similarly the vitreous throughout our lives can begin to pull away from the retina. That process is called a posterior vitreous detachment. In some  cases, the vitreous can tug hard enough on the retina to form a retinal tear. I discussed with the patient the signs and symptoms of a retinal detachment.  Do not rub the eye.  Primary open angle glaucoma of both eyes, mild stage Continue medications topically as per Dr. Kathrin Penner  Left posterior capsular opacification Okay to proceed with YAG capsulotomy with Dr. Kathrin Penner  Right epiretinal membrane The nature of macular pucker (epiretinal membrane ERM) was discussed with the patient as well as threshold criteria for vitrectomy surgery. I explained that in rare cases another surgery is needed to actually remove a second wrinkle should it regrow.  Most often, the epiretinal membrane and underlying wrinkled internal limiting membrane are removed with the first surgery, to accomplish the goals.   If the operative eye is Phakic (natural lens still present), cataract surgery is often recommended prior to Vitrectomy. This will enable the retina surgeon to have the best view during surgery and the patient to obtain optimal results in the future. Treatment options were discussed.  Currently no impact on acuity and no change year-over-year.  Will observe      ICD-10-CM   1. Right epiretinal membrane  H35.371 OCT, Retina - OU - Both Eyes  2. Right retinoschisis  H33.101   3. Early stage nonexudative age-related macular degeneration of both eyes  H35.3131 OCT, Retina - OU - Both Eyes  4. 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 Degenerative retinal drusen of right eye  H35.361   6. Posterior vitreous detachment of both eyes  H43.813   7.  Primary open angle glaucoma of both eyes, mild stage  H40.1131   8. Left posterior capsular opacification  H26.492     1.  Will observe OD, no impact from epiretinal membrane  2.  To proceed with YAG capsulotomy under the direction of Dr. Juanito Doom  3.  Ophthalmic Meds Ordered this visit:  No orders of the defined types were  placed in this encounter.      Return in about 1 year (around 02/10/2021).  Patient Instructions  To report any new onset visual acuity declines or distortions    Explained the diagnoses, plan, and follow up with the patient and they expressed understanding.  Patient expressed understanding of the importance of proper follow up care.   Clent Demark Cassady Stanczak M.D. Diseases & Surgery of the Retina and Vitreous [facility] 02/11/20     Abbreviations: M myopia (nearsighted); A astigmatism; H hyperopia (farsighted); P presbyopia; Mrx spectacle prescription;  CTL contact lenses; OD right eye; OS left eye; OU both eyes  XT exotropia; ET esotropia; PEK punctate epithelial keratitis; PEE punctate epithelial erosions; DES dry eye syndrome; MGD meibomian gland dysfunction; ATs artificial tears; PFAT's preservative free artificial tears; [place] nuclear sclerotic cataract; PSC posterior subcapsular cataract; ERM epi-retinal membrane; PVD posterior vitreous detachment; RD retinal detachment; DM diabetes mellitus; DR diabetic retinopathy; NPDR non-proliferative diabetic retinopathy; PDR proliferative diabetic retinopathy; CSME clinically significant macular edema; DME diabetic macular edema; dbh dot blot hemorrhages; CWS cotton wool spot; POAG primary open angle glaucoma; C/D cup-to-disc ratio; HVF humphrey visual field; GVF goldmann visual field; OCT optical coherence tomography; IOP intraocular pressure; BRVO Branch retinal vein occlusion; CRVO central retinal vein occlusion; CRAO central retinal artery occlusion; BRAO branch retinal artery occlusion; RT retinal tear; SB scleral buckle; PPV pars plana vitrectomy; VH Vitreous hemorrhage; PRP panretinal laser photocoagulation; IVK intravitreal kenalog; VMT vitreomacular traction; MH Macular hole;  NVD neovascularization of the disc; NVE neovascularization elsewhere; AREDS age related eye disease study; ARMD age related macular degeneration; POAG primary  open angle glaucoma; EBMD epithelial/anterior basement membrane dystrophy; ACIOL anterior chamber intraocular lens; IOL intraocular lens; PCIOL posterior chamber intraocular lens; Phaco/IOL phacoemulsification with intraocular lens placement; [place] photorefractive keratectomy; LASIK laser assisted in situ keratomileusis; HTN hypertension; DM diabetes mellitus; COPD chronic obstructive pulmonary disease

## 2020-02-11 NOTE — Assessment & Plan Note (Signed)
Okay to proceed with YAG capsulotomy with Dr. Kathrin Penner

## 2020-02-11 NOTE — Assessment & Plan Note (Signed)
Continue medications topically as per Dr. Kathrin Penner

## 2020-02-11 NOTE — Assessment & Plan Note (Signed)
The nature of macular pucker (epiretinal membrane ERM) was discussed with the patient as well as threshold criteria for vitrectomy surgery. I explained that in rare cases another surgery is needed to actually remove a second wrinkle should it regrow.  Most often, the epiretinal membrane and underlying wrinkled internal limiting membrane are removed with the first surgery, to accomplish the goals.   If the operative eye is Phakic (natural lens still present), cataract surgery is often recommended prior to Vitrectomy. This will enable the retina surgeon to have the best view during surgery and the patient to obtain optimal results in the future. Treatment options were discussed.  Currently no impact on acuity and no change year-over-year.  Will observe

## 2020-02-28 NOTE — Telephone Encounter (Signed)
So since you had the disease and the vaccine   No rush to get a booster as you probably have the best immunity. However you can still chose to get the booster   .   But maybe wait   A few  Mor months   So far the natural immunity lasts longer than  Vaccine immunity .I was hoping they would  come out with a variant booster  ...but I haven't  Heard anything  About that.

## 2020-03-24 ENCOUNTER — Other Ambulatory Visit: Payer: Medicare Other

## 2020-04-18 NOTE — Telephone Encounter (Signed)
You could choose to get a booster at any time now a year out .   fortunately data shows you probably immune but because of your age is a risk factor but it is worth getting a booster.    I advise a GI appointment because you have new his symptoms Please make an appointment with the [facility].  I suppose you could drop off the form and we can look at it amount of the office in the next weeks  so I will be able to do the form right away.

## 2020-04-21 ENCOUNTER — Telehealth: Payer: Self-pay | Admitting: Internal Medicine

## 2020-04-21 NOTE — Telephone Encounter (Signed)
Left message for patient to call back and schedule Medicare Annual Wellness Visit (AWV) either virtually or in office.   Last AWV 11/22/16  please schedule at anytime with [facility] Nurse Health Advisor 1 or 2   This should be a 45 minute visit.

## 2020-04-23 ENCOUNTER — Telehealth: Payer: Self-pay

## 2020-04-23 NOTE — Telephone Encounter (Signed)
Received form from patient from [place] paper prescriptions for Advair Diskus and Detrol LA. Will place form in red folder.  Spoke with patient advised her that MD is out of office for two weeks and upon her return will address with her. Will call patient when MD returns to give update on form

## 2020-05-07 MED ORDER — FLUTICASONE-SALMETEROL 250-50 MCG/DOSE IN AEPB: INHALATION_SPRAY | RESPIRATORY_TRACT | 3 refills | Status: DC

## 2020-05-07 MED ORDER — TOLTERODINE TARTRATE ER 2 MG PO CP24: 2.0000 mg | ORAL_CAPSULE | Freq: Every day | ORAL | 1 refills | Status: DC

## 2020-05-07 NOTE — Telephone Encounter (Signed)
I printed this out and signed and is on the nursing desk in the future please have nursing staff printed out for me to review and sign first.

## 2020-05-07 NOTE — Telephone Encounter (Signed)
Patient is calling and requesting a call back regarding previous message left, please advise. CB is [phone]

## 2020-05-08 NOTE — Telephone Encounter (Signed)
Provider completed form along with requested prescriptions.  Called patient left form at front desk for pickup

## 2020-05-21 NOTE — Telephone Encounter (Signed)
Hard to know  Without a test.     You could have had a minimal case of covid( acts like sinus infection  ) but glad you are getting better  Either way.   Ok to get  covid booster  After you are recovered from this resp infection

## 2020-05-23 ENCOUNTER — Ambulatory Visit (INDEPENDENT_AMBULATORY_CARE_PROVIDER_SITE_OTHER): Payer: Medicare Other | Admitting: Podiatry

## 2020-05-23 ENCOUNTER — Ambulatory Visit (INDEPENDENT_AMBULATORY_CARE_PROVIDER_SITE_OTHER): Payer: Medicare Other

## 2020-05-23 ENCOUNTER — Other Ambulatory Visit: Payer: Self-pay | Admitting: Podiatry

## 2020-05-23 ENCOUNTER — Other Ambulatory Visit: Payer: Self-pay

## 2020-05-23 DIAGNOSIS — M7672 Peroneal tendinitis, left leg: Secondary | ICD-10-CM

## 2020-05-23 DIAGNOSIS — M79672 Pain in left foot: Secondary | ICD-10-CM

## 2020-05-23 MED ORDER — TRIAMCINOLONE ACETONIDE 10 MG/ML IJ SUSP
10.0000 mg | Freq: Once | INTRAMUSCULAR | Status: AC
  Administered 2020-05-23: 10 mg

## 2020-05-26 NOTE — Progress Notes (Signed)
Subjective:   Patient ID: Marie Kelly, female   DOB: 75 y.o.   MRN: [id number]   HPI Patient presents with a lot of pain in the outside of the left foot and does not remember specific injury.  States that its been making it hard for her to wear shoe gear comfortably or be active   ROS      Objective:  Physical Exam  Neurovascular status intact with quite a bit of discomfort in the lateral side left foot base of fifth metatarsal with fluid buildup not at the insertional point tendon into the base of the fifth met     Assessment:  Peroneal tendinitis left with inflammation fluid buildup     Plan:  H&P reviewed condition and went ahead today and did sterile prep and injected the base of the fifth metatarsal sheath of the tendon 3 mg dexamethasone Kenalog 5 mg Xylocaine applied fascial brace to lift up the lateral's foot gave instructions on ice therapy anti-inflammatories topical medicines and reappoint to recheck  X-rays were negative for signs of fracture appears to be soft tissue no indications of abnormal base formation

## 2020-06-23 NOTE — Telephone Encounter (Signed)
Yes get her on the schedule for a yearly physical med check and we can do labs at the visit as she is due. In the meantime you can refill her medications until she can get in for a visit.  No longer than 1 to 2 months.

## 2020-06-24 ENCOUNTER — Other Ambulatory Visit: Payer: Self-pay

## 2020-06-24 MED ORDER — TELMISARTAN 80 MG PO TABS: 80.0000 mg | ORAL_TABLET | Freq: Every day | ORAL | 0 refills | Status: DC

## 2020-06-24 MED ORDER — MONTELUKAST SODIUM 10 MG PO TABS: 10.0000 mg | ORAL_TABLET | Freq: Every day | ORAL | 0 refills | Status: DC

## 2020-06-24 MED ORDER — CARVEDILOL 6.25 MG PO TABS: ORAL_TABLET | ORAL | 0 refills | Status: DC

## 2020-06-24 MED ORDER — LEVOTHYROXINE SODIUM 50 MCG PO TABS: ORAL_TABLET | ORAL | 0 refills | Status: DC

## 2020-06-25 ENCOUNTER — Other Ambulatory Visit: Payer: Self-pay

## 2020-06-25 MED ORDER — LEVOTHYROXINE SODIUM 50 MCG PO TABS: ORAL_TABLET | ORAL | 0 refills | Status: DC

## 2020-06-25 MED ORDER — MONTELUKAST SODIUM 10 MG PO TABS: 10.0000 mg | ORAL_TABLET | Freq: Every day | ORAL | 0 refills | Status: DC

## 2020-06-25 MED ORDER — TELMISARTAN 80 MG PO TABS: 80.0000 mg | ORAL_TABLET | Freq: Every day | ORAL | 0 refills | Status: DC

## 2020-06-25 MED ORDER — CARVEDILOL 6.25 MG PO TABS: ORAL_TABLET | ORAL | 0 refills | Status: DC

## 2020-07-10 ENCOUNTER — Other Ambulatory Visit: Payer: Self-pay | Admitting: Internal Medicine

## 2020-07-11 DIAGNOSIS — L814 Other melanin hyperpigmentation: Secondary | ICD-10-CM | POA: Diagnosis not present

## 2020-07-11 DIAGNOSIS — L821 Other seborrheic keratosis: Secondary | ICD-10-CM | POA: Diagnosis not present

## 2020-07-11 DIAGNOSIS — D229 Melanocytic nevi, unspecified: Secondary | ICD-10-CM | POA: Diagnosis not present

## 2020-07-11 DIAGNOSIS — D485 Neoplasm of uncertain behavior of skin: Secondary | ICD-10-CM | POA: Diagnosis not present

## 2020-07-11 DIAGNOSIS — L989 Disorder of the skin and subcutaneous tissue, unspecified: Secondary | ICD-10-CM | POA: Diagnosis not present

## 2020-07-11 DIAGNOSIS — D1801 Hemangioma of skin and subcutaneous tissue: Secondary | ICD-10-CM | POA: Diagnosis not present

## 2020-07-11 DIAGNOSIS — D225 Melanocytic nevi of trunk: Secondary | ICD-10-CM | POA: Diagnosis not present

## 2020-07-14 DIAGNOSIS — H43813 Vitreous degeneration, bilateral: Secondary | ICD-10-CM | POA: Diagnosis not present

## 2020-07-14 DIAGNOSIS — H401232 Low-tension glaucoma, bilateral, moderate stage: Secondary | ICD-10-CM | POA: Diagnosis not present

## 2020-07-14 DIAGNOSIS — H5340 Unspecified visual field defects: Secondary | ICD-10-CM | POA: Diagnosis not present

## 2020-07-14 DIAGNOSIS — H47323 Drusen of optic disc, bilateral: Secondary | ICD-10-CM | POA: Diagnosis not present

## 2020-07-21 NOTE — Telephone Encounter (Signed)
Thanks for the update

## 2020-07-24 ENCOUNTER — Other Ambulatory Visit: Payer: Self-pay | Admitting: Internal Medicine

## 2020-07-24 DIAGNOSIS — Z1231 Encounter for screening mammogram for malignant neoplasm of breast: Secondary | ICD-10-CM

## 2020-08-12 DIAGNOSIS — I10 Essential (primary) hypertension: Secondary | ICD-10-CM

## 2020-08-12 DIAGNOSIS — E785 Hyperlipidemia, unspecified: Secondary | ICD-10-CM

## 2020-08-12 DIAGNOSIS — E039 Hypothyroidism, unspecified: Secondary | ICD-10-CM

## 2020-08-12 DIAGNOSIS — Z79899 Other long term (current) drug therapy: Secondary | ICD-10-CM

## 2020-08-12 DIAGNOSIS — R7301 Impaired fasting glucose: Secondary | ICD-10-CM

## 2020-08-12 DIAGNOSIS — R0989 Other specified symptoms and signs involving the circulatory and respiratory systems: Secondary | ICD-10-CM

## 2020-08-14 NOTE — Telephone Encounter (Signed)
Suggest see ENT  of [facility]  thye ar in [place] [facility] but in [place]   All are good  Will place orders so you can get labs pre visit

## 2020-08-15 ENCOUNTER — Encounter: Payer: Self-pay | Admitting: Internal Medicine

## 2020-08-20 ENCOUNTER — Ambulatory Visit (INDEPENDENT_AMBULATORY_CARE_PROVIDER_SITE_OTHER): Payer: Medicare Other | Admitting: Podiatry

## 2020-08-20 ENCOUNTER — Encounter: Payer: Self-pay | Admitting: Podiatry

## 2020-08-20 ENCOUNTER — Other Ambulatory Visit: Payer: Self-pay

## 2020-08-20 DIAGNOSIS — M7672 Peroneal tendinitis, left leg: Secondary | ICD-10-CM | POA: Diagnosis not present

## 2020-08-20 MED ORDER — TRIAMCINOLONE ACETONIDE 10 MG/ML IJ SUSP
10.0000 mg | Freq: Once | INTRAMUSCULAR | Status: AC
  Administered 2020-08-20: 10 mg

## 2020-08-21 NOTE — Progress Notes (Signed)
Subjective:   Patient ID: Marie Kelly, female   DOB: 75 y.o.   MRN: [id number]   HPI Patient presents with pain in the outside the left foot stated it just started it is acute and she is getting ready to go out of town   ROS      Objective:  Physical Exam  Neurovascular status intact with exquisite discomfort in the left peroneal insertion fifth metatarsal base     Assessment:  Acute peroneal tendinitis left     Plan:  Sterile prep injected the tendon insertion 3 mg dexamethasone Kenalog 5 mg Xylocaine advised on anti-inflammatories reappoint to recheck

## 2020-09-16 ENCOUNTER — Other Ambulatory Visit: Payer: Self-pay

## 2020-09-16 NOTE — Telephone Encounter (Signed)
Last OV: 06/11/2019 Last Refill: 05/07/2020 Future OV: 09/17/2020

## 2020-09-17 ENCOUNTER — Ambulatory Visit: Payer: Medicare Other

## 2020-09-17 ENCOUNTER — Ambulatory Visit (INDEPENDENT_AMBULATORY_CARE_PROVIDER_SITE_OTHER): Payer: Medicare Other | Admitting: Internal Medicine

## 2020-09-17 ENCOUNTER — Encounter: Payer: Self-pay | Admitting: Internal Medicine

## 2020-09-17 VITALS — BP 114/64 | HR 50 | Temp 98.1°F | Ht 63.0 in | Wt 175.0 lb

## 2020-09-17 DIAGNOSIS — E2839 Other primary ovarian failure: Secondary | ICD-10-CM | POA: Diagnosis not present

## 2020-09-17 DIAGNOSIS — E785 Hyperlipidemia, unspecified: Secondary | ICD-10-CM | POA: Diagnosis not present

## 2020-09-17 DIAGNOSIS — I10 Essential (primary) hypertension: Secondary | ICD-10-CM | POA: Diagnosis not present

## 2020-09-17 DIAGNOSIS — E039 Hypothyroidism, unspecified: Secondary | ICD-10-CM | POA: Diagnosis not present

## 2020-09-17 DIAGNOSIS — Z8262 Family history of osteoporosis: Secondary | ICD-10-CM | POA: Diagnosis not present

## 2020-09-17 DIAGNOSIS — Z79899 Other long term (current) drug therapy: Secondary | ICD-10-CM

## 2020-09-17 LAB — CBC WITH DIFFERENTIAL/PLATELET
Basophils Absolute: 0 10*3/uL (ref 0.0–0.1)
Basophils Relative: 0.3 % (ref 0.0–3.0)
Eosinophils Absolute: 0.3 10*3/uL (ref 0.0–0.7)
Eosinophils Relative: 7.5 % — ABNORMAL HIGH (ref 0.0–5.0)
HCT: 37.3 % (ref 36.0–46.0)
Hemoglobin: 12.6 g/dL (ref 12.0–15.0)
Lymphocytes Relative: 23.3 % (ref 12.0–46.0)
Lymphs Abs: 1.1 10*3/uL (ref 0.7–4.0)
MCHC: 33.7 g/dL (ref 30.0–36.0)
MCV: 87.7 fl (ref 78.0–100.0)
Monocytes Absolute: 0.6 10*3/uL (ref 0.1–1.0)
Monocytes Relative: 11.9 % (ref 3.0–12.0)
Neutro Abs: 2.7 10*3/uL (ref 1.4–7.7)
Neutrophils Relative %: 57 % (ref 43.0–77.0)
Platelets: 232 10*3/uL (ref 150.0–400.0)
RBC: 4.25 Mil/uL (ref 3.87–5.11)
RDW: 15.5 % (ref 11.5–15.5)
WBC: 4.6 10*3/uL (ref 4.0–10.5)

## 2020-09-17 LAB — BASIC METABOLIC PANEL
BUN: 15 mg/dL (ref 6–23)
CO2: 24 mEq/L (ref 19–32)
Calcium: 9.6 mg/dL (ref 8.4–10.5)
Chloride: 98 mEq/L (ref 96–112)
Creatinine, Ser: 0.81 mg/dL (ref 0.40–1.20)
GFR: 71.02 mL/min (ref >60.00)
Glucose, Bld: 94 mg/dL (ref 70–99)
Potassium: 4.6 mEq/L (ref 3.5–5.1)
Sodium: 132 mEq/L — ABNORMAL LOW (ref 135–145)

## 2020-09-17 LAB — LIPID PANEL
Cholesterol: 227 mg/dL — ABNORMAL HIGH (ref 0–200)
HDL: 50.7 mg/dL (ref >39.00)
LDL Cholesterol: 163 mg/dL — ABNORMAL HIGH (ref 0–99)
NonHDL: 175.96
Total CHOL/HDL Ratio: 4
Triglycerides: 67 mg/dL (ref 0.0–149.0)
VLDL: 13.4 mg/dL (ref 0.0–40.0)

## 2020-09-17 LAB — HEPATIC FUNCTION PANEL
ALT: 20 U/L (ref 0–35)
AST: 17 U/L (ref 0–37)
Albumin: 4.6 g/dL (ref 3.5–5.2)
Alkaline Phosphatase: 45 U/L (ref 39–117)
Bilirubin, Direct: 0.1 mg/dL (ref 0.0–0.3)
Total Bilirubin: 0.8 mg/dL (ref 0.2–1.2)
Total Protein: 7.2 g/dL (ref 6.0–8.3)

## 2020-09-17 LAB — HEMOGLOBIN A1C: Hgb A1c MFr Bld: 5.4 % (ref 4.6–6.5)

## 2020-09-17 LAB — TSH: TSH: 2.02 u[IU]/mL (ref 0.35–4.50)

## 2020-09-17 MED ORDER — TELMISARTAN 80 MG PO TABS: 80.0000 mg | ORAL_TABLET | Freq: Every day | ORAL | 3 refills | Status: DC

## 2020-09-17 MED ORDER — TOLTERODINE TARTRATE ER 2 MG PO CP24: 2.0000 mg | ORAL_CAPSULE | Freq: Every day | ORAL | 3 refills | Status: DC

## 2020-09-17 MED ORDER — MONTELUKAST SODIUM 10 MG PO TABS: 10.0000 mg | ORAL_TABLET | Freq: Every day | ORAL | 3 refills | Status: DC

## 2020-09-17 MED ORDER — CARVEDILOL 6.25 MG PO TABS: ORAL_TABLET | ORAL | 0 refills | Status: DC

## 2020-09-17 NOTE — Patient Instructions (Signed)
  Can try dec carvedilol to 6.25 twice a day  After a few weeks can decrease to 3.25 twice a day Send in BP reading and pulse  after a few weeks at that dose .  Glad you are [employer] .  Continue lifestyle intervention healthy eating and exercise .      Results:  Lumbar spine (L1-L3) Femoral neck (FN)  T-score  +0.7  RFN: -1.2 LFN: -0.9    I can order another DEXA scan  Call for appt   At [facility] radiology.  When convenient .

## 2020-09-17 NOTE — Progress Notes (Signed)
Chief Complaint  Patient presents with  . Annual Exam    CPE/labs.  Fasting today.      HPI: Patient  Marie Kelly  75 y.o. comes in today for yearly visit [place] evaluation GU   [place] and asks to fill out fax seems to help her quite a bit although she may try it every other day HT has lost weight over 40 pounds keto eating plan MI down to 29 on bid carvedilol and telmisartan.  Wants to get off medication can she do this question lab pressures at home are in the 1110 range  and no dizziness. THryoid medication daily no concerns Respiratory stable on Advair and allergy season currently needs refill of her Singulair.  Occasional use of her albuterol.  GI  Blood in  stool.  Could be hemorrhoids brother died.    Colon cancer back to see GI who had originally stated she needed no more colonoscopies. Recovering tendon inflammation left foot ankle. She sees chiropractor on a regular basis for "general health" x-rays about every 6 months. Family history of osteoporosis and in mom may be due for the next bone density.  Health Maintenance  Topic Date Due  . INFLUENZA VACCINE  12/15/2020  . TETANUS/TDAP  09/08/2021  . COLONOSCOPY (Pts 45-64yrs Insurance coverage will need to be confirmed)  02/04/2026  . DEXA SCAN  Completed  . COVID-19 Vaccine  Completed  . Hepatitis C Screening  Completed  . PNA vac Low Risk Adult  Completed  . HPV VACCINES  Aged Out   Health Maintenance Review LIFESTYLE:  Exercise:  10 k per day   To the Y keto diet.  Losing weight .  Tobacco/ETS: Alcohol: n Sugar beverages:n Sleep: about 8-9 Drug use: no HH of   2 no pets    ROS:  GEN/ HEENT: No fever, significant weight changes sweats headaches vision problems hearing changes, CV/ PULM; No chest pain shortness of breath cough, syncope,edema  change in exercise tolerance. GI /GU: No adominal pain, vomiting, change in bowel habits. No blood in the stool. No significant GU  symptoms. SKIN/HEME: ,no acute skin rashes suspicious lesions or bleeding. No lymphadenopathy, nodules, masses.  NEURO/ PSYCH:  No neurologic signs such as weakness numbness. No depression anxiety. IMM/ Allergy: No unusual infections.  Allergy .   REST of 12 system review negative except as per HPI   Past Medical History:  Diagnosis Date  . Anemia    pt. denies  . Arthritis   . Asthma   . 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 Colon polyps   . Complication of anesthesia    2 days after surgery-muscles hurtand spasmed-needed a muscle relaxant   . Diverticulosis   . Fracture of right olecranon process 10/26/2013  . GERD (gastroesophageal reflux disease)    had endo? taking nexium 3 x per week  . Hyperlipidemia    had been on lipitor changes to pravachol cause of cost and tricor has since stopped  NO  se reported   . Hypertension   . Hypothyroidism     on meds for years  . Impaired fasting glucose   . Olecranon fracture 10/25/2013   right arm  . PONV (postoperative nausea and vomiting)   . Ventricular bigeminy seen on cardiac monitor    Cardiac work up Dr Stanford Breed [facility]  . Wears dentures    top-partial bottom  . Wears  glasses     Past Surgical History:  Procedure Laterality Date  . BELPHAROPTOSIS REPAIR Bilateral   . CATARACT EXTRACTION Bilateral 11/2016  . CHOLECYSTECTOMY    . COLONOSCOPY    . HARVEST BONE GRAFT Right 04/19/2014   Procedure: HARVEST LEFT TIBIA BONE GRAFT ;  Surgeon: Johnny Bridge, MD;  Location: [place];  Service: Orthopedics;  Laterality: Right;  . HEMORROIDECTOMY    . KNEE ARTHROSCOPY Right   . NASAL SINUS SURGERY    . ORIF ELBOW FRACTURE Right 10/26/2013   Procedure: RIGHT ELBOW FRACTUE OPEN TREATMENT ULNAR PROXIMAL END OLECRANON PROCESS INCLUDES INTERNAL FIXATION;  Surgeon: Johnny Bridge, MD;  Location: [place];  Service: Orthopedics;  Laterality: Right;  . ORIF  ELBOW FRACTURE Right 04/19/2014   Procedure: OPEN REDUCTION INTERNAL FIXATION (ORIF) RIGHT ELBOW/OLECRANON NONUNION FRACTURE WITH HARDWARE REMOVAL;  Surgeon: Johnny Bridge, MD;  Location: [place];  Service: Orthopedics;  Laterality: Right;  . TONSILLECTOMY    . TUBAL LIGATION    . uterus repair      Family History  Problem Relation Age of Onset  . Stroke Mother   . Diabetes Father   . Heart disease Father   . Alzheimer's disease Other   . Stomach cancer Maternal Aunt   . Colon cancer Neg Hx   . Pancreatic cancer Neg Hx     Social History   Socioeconomic History  . Marital status: Married    Spouse name: Not on file  . Number of children: 3  . Years of education: Not on file  . Highest education level: Not on file  Occupational History  . Not on file  Tobacco Use  . Smoking status: Former Smoker    Quit date: 10/25/1973    Years since quitting: 46.9  . Smokeless tobacco: Never Used  Vaping Use  . Vaping Use: Never used  Substance and Sexual Activity  . Alcohol use: Yes    Alcohol/week: 0.0 standard drinks    Comment: socially  . Drug use: No  . Sexual activity: Yes  Other Topics Concern  . 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    Gravida 3 para 3   Last td 6 2008   HS educated       REmote hx of tobacco 1.5 ppd for 6 years as a teen Q 54   Social Determinants of Radio broadcast assistant Strain: Not on file  Food Insecurity: Not on file  Transportation Needs: Not on file  Physical Activity: Not on file  Stress: Not on file  Social Connections: Not on file    Outpatient Medications Prior to Visit  Medication Sig Dispense Refill  . albuterol (PROVENTIL HFA;VENTOLIN HFA) 108 (90 Base) MCG/ACT inhaler Inhale 2 puffs into the lungs every 6 (six) hours as needed for  wheezing or shortness of breath. 1 Inhaler 1  . bimatoprost (LUMIGAN) 0.01 % SOLN Place 1 drop into both eyes at bedtime.    . Fluticasone-Salmeterol (ADVAIR) 250-50 MCG/DOSE AEPB USE 1 INHALATION TWICE A DAY 180 each 2  . levothyroxine (SYNTHROID) 50 MCG tablet TAKE 1 TABLET DAILY (NEED APPOINTMENT NO FURTHER REFILLS UNTIL SEEN) 90 tablet 0  . valACYclovir (  VALTREX) 1000 MG tablet     . carvedilol (COREG) 6.25 MG tablet TAKE 1 TABLET IN THE MORNING AND 2 TABLETS IN THE EVENING 270 tablet 0  . montelukast (SINGULAIR) 10 MG tablet Take 1 tablet (10 mg total) by mouth at bedtime. 90 tablet 0  . telmisartan (MICARDIS) 80 MG tablet Take 1 tablet (80 mg total) by mouth daily. 90 tablet 0  . tolterodine (DETROL LA) 2 MG 24 hr capsule Take 1 capsule (2 mg total) by mouth daily. 90 capsule 1  . albuterol (PROVENTIL) (2.5 MG/3ML) 0.083% nebulizer solution Take 3 mLs (2.5 mg total) by nebulization every 6 (six) hours as needed for wheezing or shortness of breath. 75 mL 12  . cetirizine (ZYRTEC) 5 MG tablet Take 1 tablet (5 mg total) by mouth daily. 30 tablet 0  . meloxicam (MOBIC) 15 MG tablet Take 15 mg by mouth daily as needed for pain. (Patient not taking: Reported on 09/17/2020)     No facility-administered medications prior to visit.     EXAM:  BP 114/64   Pulse (!) 50   Temp 98.1 F (36.7 C) (Oral)   Ht 5\' 3"  (1.6 m)   Wt 175 lb (79.4 kg)   SpO2 97%   BMI 31.00 kg/m   Body mass index is 31 kg/m. Wt Readings from Last 3 Encounters:  09/17/20 175 lb (79.4 kg)  11/05/19 192 lb (87.1 kg)  10/11/19 198 lb 9.6 oz (90.1 kg)    Physical Exam: Vital signs reviewed [id number] is a well-developed well-nourished alert cooperative    who appearsr stated age in no acute distress.  HEENT: normocephalic atraumatic , Eyes: PERRL EOM's full, conjunctiva clear,tenderness., Ears: no deformity EAC's clear TMs with normal landmarks. Mouth: Mast NECK: supple without masses, thyromegaly or  bruits. CHEST/PULM:  Clear to auscultation and percussion breath sounds equal no wheeze , rales or rhonchi. No chest wall deformities or tenderness. Breast: normal by inspection . No dimpling, discharge, masses, tenderness or discharge . CV: PMI is nondisplaced, S1 S2 no gallops, murmurs, rubs. Peripheral pulses are full without delay.No JVD .  ABDOMEN: Bowel sounds normal nontender  No guard or rebound, no hepato splenomegal no CVA tenderness.  Extremtities:  No clubbing cyanosis or edema, no acute joint swelling or redness no focal atrophy left ankle foot in wrap. NEURO:  Oriented x3, cranial nerves 3-12 appear to be intact, no obvious focal weakness,gait within normal limits no abnormal reflexes or asymmetrical SKIN: No acute rashes normal turgor, color, no bruising or petechiae. PSYCH: Oriented, good eye contact, no obvious depression anxiety, cognition and judgment appear normal. LN: no cervical axillary inguinal adenopathy  Lab Results  Component Value Date   WBC 4.6 09/17/2020   HGB 12.6 09/17/2020   HCT 37.3 09/17/2020   PLT 232.0 09/17/2020   GLUCOSE 94 09/17/2020   CHOL 227 (H) 09/17/2020   TRIG 67.0 09/17/2020   HDL 50.70 09/17/2020   LDLDIRECT 168.5 04/23/2013   LDLCALC 163 (H) 09/17/2020   ALT 20 09/17/2020   AST 17 09/17/2020   NA 132 (L) 09/17/2020   K 4.6 09/17/2020   CL 98 09/17/2020   CREATININE 0.81 09/17/2020   BUN 15 09/17/2020   CO2 24 09/17/2020   TSH 2.02 09/17/2020   HGBA1C 5.4 09/17/2020    BP Readings from Last 3 Encounters:  09/17/20 114/64  11/05/19 129/80  10/11/19 128/66    Lab plan reviewed with patient   ASSESSMENT AND PLAN:  Discussed the following assessment  and plan:    ICD-10-CM   1. Essential hypertension  99991111 Basic metabolic panel    CBC with Differential/Platelet    Hepatic function panel    Lipid panel    TSH    Hemoglobin A1c    Hemoglobin A1c    TSH    Lipid panel    Hepatic function panel    Basic metabolic panel     CBC with Differential/Platelet  2. Hyperlipidemia, unspecified hyperlipidemia type  99991111 Basic metabolic panel    CBC with Differential/Platelet    Hepatic function panel    Lipid panel    TSH    Hemoglobin A1c    Hemoglobin A1c    TSH    Lipid panel    Hepatic function panel    Basic metabolic panel    CBC with Differential/Platelet  3. Medication management  [id number] Basic metabolic panel    CBC with Differential/Platelet    Hepatic function panel    Lipid panel    TSH    Hemoglobin A1c    Hemoglobin A1c    TSH    Lipid panel    Hepatic function panel    Basic metabolic panel    CBC with Differential/Platelet  4. Hypothyroidism, unspecified type  0000000 Basic metabolic panel    CBC with Differential/Platelet    Hepatic function panel    Lipid panel    TSH    Hemoglobin A1c    Hemoglobin A1c    TSH    Lipid panel    Hepatic function panel    Basic metabolic panel    CBC with Differential/Platelet  5. Family history of osteoporosis  [id number] Basic metabolic panel    CBC with Differential/Platelet    Hepatic function panel    Lipid panel    TSH    Hemoglobin A1c    DG Bone Density    Hemoglobin A1c    TSH    Lipid panel    Hepatic function panel    Basic metabolic panel    CBC with Differential/Platelet  6. Estrogen deficiency  E28.39 DG Bone Density  Discussed blood pressure goal 130/80 and below and her current blood pressure is optimal however she is very wishing to get off medicine.  Discussed how to wean the carvedilol but has to do it slowly.  If blood pressure rises we will have to really intensify. Continue her healthy lifestyle and weight loss.  Which will be helpful. Refill her carvedilol Singulair and Micardis today she gets her Advair overseas and we will refill the Detrol LA needs to be faxed to an Occupational psychologist. I agree she should follow-up with colonoscopy or colon evaluation because of her history and family. Order bone density.  Positive family  history Return for depending on results and Bp .  Patient Care Team: Aleaya Latona, Standley Brooking, MD as PCP - General (Internal Medicine) Shon Hough, MD (Ophthalmology) Marchia Bond, MD as Consulting Physician (Orthopedic Surgery) Patient Instructions    Can try dec carvedilol to 6.25 twice a day  After a few weeks can decrease to 3.25 twice a day Send in BP reading and pulse  after a few weeks at that dose .  Glad you are [employer] .  Continue lifestyle intervention healthy eating and exercise .      Results:  Lumbar spine (L1-L3) Femoral neck (FN)  T-score  +0.7  RFN: -1.2 LFN: -0.9    I can order another DEXA scan  Call for  appt   At [facility] radiology.  When convenient .    Standley Brooking. Beverly Suriano M.D. Pt scheduled for 08/20/2019 @3 :00 [place] -sent put letter on  [facility], [place] [address] Phone [phone] Fax [phone]

## 2020-09-18 ENCOUNTER — Other Ambulatory Visit: Payer: Self-pay

## 2020-09-18 ENCOUNTER — Ambulatory Visit
Admission: RE | Admit: 2020-09-18 | Discharge: 2020-09-18 | Disposition: A | Discharge status: Home / Self Care | Payer: Medicare Other | Source: Ambulatory Visit | Attending: Internal Medicine | Admitting: Internal Medicine

## 2020-09-18 DIAGNOSIS — Z1231 Encounter for screening mammogram for malignant neoplasm of breast: Secondary | ICD-10-CM

## 2020-09-19 DIAGNOSIS — Z800 Family history of malignant neoplasm of digestive organs: Secondary | ICD-10-CM | POA: Diagnosis not present

## 2020-09-19 DIAGNOSIS — K625 Hemorrhage of anus and rectum: Secondary | ICD-10-CM | POA: Diagnosis not present

## 2020-09-20 NOTE — Progress Notes (Signed)
Blood sugara is in normal range and a1c is much better .   Cholesterol  triglycerides now normal  good hdl is better even though ldl still elevated    favorable trend. Continue lifestyle intervention healthy eating and exercise .  And follow yearly .   let us know if you wish to begin lipid lowering medication as option.

## 2020-09-24 NOTE — Progress Notes (Signed)
Noted  

## 2020-09-25 DIAGNOSIS — E7800 Pure hypercholesterolemia, unspecified: Secondary | ICD-10-CM

## 2020-09-25 DIAGNOSIS — E781 Pure hyperglyceridemia: Secondary | ICD-10-CM

## 2020-09-25 DIAGNOSIS — E785 Hyperlipidemia, unspecified: Secondary | ICD-10-CM

## 2020-09-25 DIAGNOSIS — Z79899 Other long term (current) drug therapy: Secondary | ICD-10-CM

## 2020-10-07 DIAGNOSIS — D225 Melanocytic nevi of trunk: Secondary | ICD-10-CM | POA: Diagnosis not present

## 2020-10-07 DIAGNOSIS — L905 Scar conditions and fibrosis of skin: Secondary | ICD-10-CM | POA: Diagnosis not present

## 2020-10-09 DIAGNOSIS — L249 Irritant contact dermatitis, unspecified cause: Secondary | ICD-10-CM | POA: Diagnosis not present

## 2020-10-13 NOTE — Telephone Encounter (Signed)
Yes Improved lipids  but usually use non hdl cholesterol  To follow . Age is your  biggest risk factor  Can consider  Ordering  Coronary artery calcium CT scan but would be self pay $99 $  To assess  Calcium build  up in arteries   Assesses risk  And thus  Benefit  If high Going on statin medication  Or you can see cardiology to get opinion  If you wish .  Either way Continue lifestyle intervention healthy eating and exercise .  Let us know if you want to do the scan referral    The 10-year ASCVD risk score Mikey Bussing DC Brooke Bonito., et al., 2013) is: 17.2%   Values used to calculate the score:     Age: 75 years     Sex: Female     Is Non-Hispanic African American: No     Diabetic: No     Tobacco smoker: No     Systolic Blood Pressure: 597 mmHg     Is BP treated: Yes     HDL Cholesterol: 50.7 mg/dL     Total Cholesterol: 227 mg/dL

## 2020-10-21 ENCOUNTER — Telehealth: Payer: Self-pay | Admitting: *Deleted

## 2020-10-21 NOTE — Chronic Care Management (AMB) (Signed)
  Chronic Care Management   Note  10/21/2020 Name: MILDA LINDVALL MRN: [id number] DOB: Sep 04, 1945  ZIASIA LENOIR is a 75 y.o. year old female who is a primary care patient of Panosh, Standley Brooking, MD. I reached out to Johnney Killian by phone today in response to a referral sent by Ms. Winifred Olive Motter's PCP, [name].     Ms. Rowand was given information about Chronic Care Management services today including:  1. CCM service includes personalized support from designated clinical staff supervised by her physician, including individualized plan of care and coordination with other care providers 2. 24/7 contact phone numbers for assistance for urgent and routine care needs. 3. Service will only be billed when office clinical staff spend 20 minutes or more in a month to coordinate care. 4. Only one practitioner may furnish and bill the service in a calendar month. 5. The patient may stop CCM services at any time (effective at the end of the month) by phone call to the office staff. 6. The patient will be responsible for cost sharing (co-pay) of up to 20% of the service fee (after annual deductible is met).  Patient did not agree to enrollment in care management services and does not wish to consider at this time.  Follow up plan: Patient declines further follow up and engagement by the care management team. Appropriate care team members and provider have been notified via electronic communication. The care management team is available to follow up with the patient after provider conversation with the patient regarding recommendation for care management engagement and subsequent re-referral to the care management team.  The patient has been provided with contact information for the care management team and has been advised to call with any health related questions or concerns.   [facility] Management

## 2020-10-28 NOTE — Telephone Encounter (Signed)
Thanks for the update

## 2020-10-31 ENCOUNTER — Telehealth: Payer: Medicare Other

## 2020-11-03 DIAGNOSIS — J0100 Acute maxillary sinusitis, unspecified: Secondary | ICD-10-CM | POA: Diagnosis not present

## 2020-11-03 DIAGNOSIS — J3089 Other allergic rhinitis: Secondary | ICD-10-CM | POA: Diagnosis not present

## 2020-11-03 DIAGNOSIS — J0120 Acute ethmoidal sinusitis, unspecified: Secondary | ICD-10-CM | POA: Diagnosis not present

## 2020-11-04 DIAGNOSIS — L989 Disorder of the skin and subcutaneous tissue, unspecified: Secondary | ICD-10-CM | POA: Diagnosis not present

## 2020-11-04 DIAGNOSIS — D2322 Other benign neoplasm of skin of left ear and external auricular canal: Secondary | ICD-10-CM | POA: Diagnosis not present

## 2020-11-06 ENCOUNTER — Other Ambulatory Visit: Payer: Self-pay

## 2020-11-06 ENCOUNTER — Ambulatory Visit (INDEPENDENT_AMBULATORY_CARE_PROVIDER_SITE_OTHER)
Admission: RE | Admit: 2020-11-06 | Discharge: 2020-11-06 | Disposition: A | Discharge status: Home / Self Care | Payer: Medicare Other | Source: Ambulatory Visit | Attending: Internal Medicine | Admitting: Internal Medicine

## 2020-11-06 DIAGNOSIS — E2839 Other primary ovarian failure: Secondary | ICD-10-CM

## 2020-11-06 DIAGNOSIS — Z8262 Family history of osteoporosis: Secondary | ICD-10-CM

## 2020-11-18 NOTE — Telephone Encounter (Signed)
noted 

## 2020-11-19 ENCOUNTER — Ambulatory Visit
Admission: RE | Admit: 2020-11-19 | Discharge: 2020-11-19 | Disposition: A | Discharge status: Home / Self Care | Payer: Medicare Other | Source: Ambulatory Visit | Attending: Internal Medicine | Admitting: Internal Medicine

## 2020-11-19 ENCOUNTER — Other Ambulatory Visit: Payer: Self-pay

## 2020-11-19 DIAGNOSIS — I10 Essential (primary) hypertension: Secondary | ICD-10-CM

## 2020-11-20 NOTE — Progress Notes (Signed)
So the coronary scan shows calcium deposits in the arteries at the 74th percentile risk category. Statin medication intervention can be beneficial in reducing the risk of future heart attack and stroke. An incidental finding shows some affect on one of the breast that could be an artifact.  To be certain we can order a diagnostic mammogram and ultrasound. Suggest make a visit to review all of these findings   in person or virtual ok.  Also see  dexa  results

## 2020-11-20 NOTE — Telephone Encounter (Addendum)
Pt informed of the results and verbalized understanding. Follow up Video visit scheduled for 07/11

## 2020-11-20 NOTE — Telephone Encounter (Signed)
See result notes dexa and  ct scan

## 2020-11-20 NOTE — Progress Notes (Signed)
So  osteopenic range down some in the hip.   Make sure  taking vit d  at least 1000 iu daily and  weight bearing activity as possible.   Uncertain how I think you took fosomax years ago    influences these results ( for the better)   . Suggest repeat dexa in 2 years . If you want  more discussion  , can make visit tor  refer to endocrine for advice .

## 2020-11-21 ENCOUNTER — Other Ambulatory Visit: Payer: Self-pay | Admitting: Internal Medicine

## 2020-11-24 ENCOUNTER — Telehealth: Payer: Medicare Other | Admitting: Internal Medicine

## 2020-11-26 ENCOUNTER — Other Ambulatory Visit: Payer: Self-pay

## 2020-11-26 ENCOUNTER — Ambulatory Visit (INDEPENDENT_AMBULATORY_CARE_PROVIDER_SITE_OTHER): Payer: Medicare Other | Admitting: Internal Medicine

## 2020-11-26 ENCOUNTER — Encounter: Payer: Self-pay | Admitting: Internal Medicine

## 2020-11-26 VITALS — BP 138/70 | HR 52 | Temp 98.1°F | Ht 63.0 in | Wt 172.4 lb

## 2020-11-26 DIAGNOSIS — R928 Other abnormal and inconclusive findings on diagnostic imaging of breast: Secondary | ICD-10-CM

## 2020-11-26 DIAGNOSIS — E7800 Pure hypercholesterolemia, unspecified: Secondary | ICD-10-CM | POA: Diagnosis not present

## 2020-11-26 DIAGNOSIS — M8580 Other specified disorders of bone density and structure, unspecified site: Secondary | ICD-10-CM

## 2020-11-26 DIAGNOSIS — Z79899 Other long term (current) drug therapy: Secondary | ICD-10-CM

## 2020-11-26 DIAGNOSIS — R931 Abnormal findings on diagnostic imaging of heart and coronary circulation: Secondary | ICD-10-CM | POA: Diagnosis not present

## 2020-11-26 DIAGNOSIS — Z8262 Family history of osteoporosis: Secondary | ICD-10-CM

## 2020-11-26 NOTE — Patient Instructions (Addendum)
Will  do referral for CV assessment  and endocrine for  further discussion about  bone density.    Diagnostic mammogram   and ultrasound  as we discussed .

## 2020-11-26 NOTE — Progress Notes (Signed)
Chief Complaint  Patient presents with   Results    HPI: Marie Kelly 75 y.o. come in for follow-up of coronary artery scan incidental findings and bone density.  Coronary artery calcium scan shows 74th percentile her own relative risk without that is about 18% to 23%. 's about other options besides statins and other interventions.  Is a family history of vascular disease but not premature. Incidental finding question right breast shadow versus mass suggested has pendulous breast and expressed remote history of biopsy no symptoms. Last regular mammogram was in May. Bone density -2 but remote history of a bisphosphonate years ago.  Family history probably positive osteoporosis mother.  She is taking vitamin D daily we will plan on picking up her weightbearing exercise.    ROS: See pertinent positives and negatives per HPI.  Past Medical History:  Diagnosis Date   Anemia    pt. denies   Arthritis    Asthma    Closed disp fx of right olecranon with intraartic exten w nonunion 04/19/2014   Colon polyps    Complication of anesthesia    2 days after surgery-muscles hurtand spasmed-needed a muscle relaxant    Diverticulosis    Fracture of right olecranon process 10/26/2013   GERD (gastroesophageal reflux disease)    had endo? taking nexium 3 x per week   Hyperlipidemia    had been on lipitor changes to pravachol cause of cost and tricor has since stopped  NO  se reported    Hypertension    Hypothyroidism     on meds for years   Impaired fasting glucose    Olecranon fracture 10/25/2013   right arm   PONV (postoperative nausea and vomiting)    Ventricular bigeminy seen on cardiac monitor    Cardiac work up Dr Berl[facility]   Wears dentures    top-partial bottom   Wears glasses     Family History  Problem Relation Age of Onset   Stroke Mother    Diabetes Father    Heart disease Father    Alzheimer's disease Other    Stomach cancer Maternal Aunt    Colon cancer  Neg Hx    Pancreatic cancer Neg Hx     Social History   Socioeconomic History   Marital status: Married    Spouse name: Not on file   Number of children: 3   Years of education: Not on file   Highest education level: Not on file  Occupational History   Not on file  Tobacco Use   Smoking status: Former    Pack years: 0.00    Types: Cigarettes    Quit date: 10/25/1973    Years since quitting: 47.1   Smokeless tobacco: Never  Vaping Use   Vaping Use: Never used  Substance and Sexual Activity   Alcohol use: Yes    Alcohol/week: 0.0 standard drinks    Comment: socially   Drug use: No   Sexual activity: Yes  Other Topics Concern   Not on file  Social History Narrative   hh of 2    Married     Husband is Still in [place]  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    Gravida 3 para 3   Last td 6 2008   HS educated       REmote hx of tobacco 1.5 ppd for  6 years as a teen Q 42   Social Determinants of Radio broadcast assistant Strain: Not on file  Food Insecurity: Not on file  Transportation Needs: Not on file  Physical Activity: Not on file  Stress: Not on file  Social Connections: Not on file    Outpatient Medications Prior to Visit  Medication Sig Dispense Refill   bimatoprost (LUMIGAN) 0.01 % SOLN Place 1 drop into both eyes at bedtime.     carvedilol (COREG) 6.25 MG tablet TAKE 1 TABLET IN THE MORNING AND 2 TABLETS IN THE EVENING or as directed 270 tablet 0   Fluticasone-Salmeterol (ADVAIR) 250-50 MCG/DOSE AEPB USE 1 INHALATION TWICE A DAY 180 each 2   levothyroxine (SYNTHROID) 50 MCG tablet TAKE 1 TABLET DAILY (NEED APPOINTMENT, NO FURTHER REFILLS UNTIL SEEN) 90 tablet 3   meloxicam (MOBIC) 15 MG tablet Take 15 mg by mouth daily as needed for pain.     montelukast (SINGULAIR) 10 MG tablet Take 1 tablet (10 mg total) by mouth at bedtime. 90 tablet 3   telmisartan (MICARDIS) 80 MG tablet  Take 1 tablet (80 mg total) by mouth daily. 90 tablet 3   tolterodine (DETROL LA) 2 MG 24 hr capsule Take 1 capsule (2 mg total) by mouth daily. 90 capsule 3   valACYclovir (VALTREX) 1000 MG tablet      albuterol (PROVENTIL HFA;VENTOLIN HFA) 108 (90 Base) MCG/ACT inhaler Inhale 2 puffs into the lungs every 6 (six) hours as needed for wheezing or shortness of breath. 1 Inhaler 1   No facility-administered medications prior to visit.     EXAM:  BP 138/70 (BP Location: Left Arm, Patient Position: Sitting, Cuff Size: Normal)   Pulse (!) 52   Temp 98.1 F (36.7 C) (Oral)   Ht 5\' 3"  (1.6 m)   Wt 172 lb 6.4 oz (78.2 kg)   SpO2 97%   BMI 30.54 kg/m   Body mass index is 30.54 kg/m.  GENERAL: vitals reviewed and listed above, alert, oriented, appears well hydrated and in no acute distress HEENT: atraumatic, conjunctiva  clear, no obvious abnormalities on inspection of external nose and ears OP : masked  NECK: no obvious masses on inspection palpation  LPSYCH: pleasant and cooperative, no obvious depression or anxiety Lab Results  Component Value Date   WBC 4.6 09/17/2020   HGB 12.6 09/17/2020   HCT 37.3 09/17/2020   PLT 232.0 09/17/2020   GLUCOSE 94 09/17/2020   CHOL 227 (H) 09/17/2020   TRIG 67.0 09/17/2020   HDL 50.70 09/17/2020   LDLDIRECT 168.5 04/23/2013   LDLCALC 163 (H) 09/17/2020   ALT 20 09/17/2020   AST 17 09/17/2020   NA 132 (L) 09/17/2020   K 4.6 09/17/2020   CL 98 09/17/2020   CREATININE 0.81 09/17/2020   BUN 15 09/17/2020   CO2 24 09/17/2020   TSH 2.02 09/17/2020   HGBA1C 5.4 09/17/2020   BP Readings from Last 3 Encounters:  11/26/20 138/70  09/17/20 114/64  11/05/19 129/80  The 10-year ASCVD risk score Mikey Bussing DC Jr., et al., 2013) is: 24.3%   Values used to calculate the score:     Age: 72 years     Sex: Female     Is Non-Hispanic African American: No     Diabetic: No     Tobacco smoker: No     Systolic Blood Pressure: 009 mmHg     Is BP treated:  Yes     HDL Cholesterol: 50.7 mg/dL  Total Cholesterol: 227 mg/dL Reviewed scans labs  ASSESSMENT AND PLAN:  Discussed the following assessment and plan:  Low bone mass - Plan: Ambulatory referral to Endocrinology  High coronary artery calcium score - reluctant for statin mom had se . had se of atorva in past, need more input  about options - Plan: Ambulatory referral to Cardiology  Elevated cholesterol - Plan: Ambulatory referral to Cardiology  Medication management  Family hx osteoporosis - Plan: Ambulatory referral to Endocrinology  Abnormal finding on breast imaging - indeterminant  poss mass on right  on ct ,CAC e  refer for diag and [place] - Plan: [facility] DIGITAL DIAG TOMO UNI RIGHT, US BREAST LTD UNI RIGHT INC AXILLA -Patient advised to return or notify health care team  if  new concerns arise. 30 minute review  consult and plan   Patient Instructions  Will  do referral for CV assessment  and endocrine for  further discussion about  bone density.    Diagnostic mammogram   and ultrasound  as we discussed .   Standley Brooking. Destenie Ingber M.D.

## 2020-11-27 ENCOUNTER — Telehealth: Payer: Medicare Other | Admitting: Internal Medicine

## 2020-12-06 ENCOUNTER — Other Ambulatory Visit: Payer: Medicare Other

## 2020-12-17 DIAGNOSIS — H401232 Low-tension glaucoma, bilateral, moderate stage: Secondary | ICD-10-CM | POA: Diagnosis not present

## 2020-12-18 ENCOUNTER — Encounter: Payer: Self-pay | Admitting: Podiatry

## 2020-12-18 ENCOUNTER — Other Ambulatory Visit: Payer: Self-pay

## 2020-12-18 ENCOUNTER — Ambulatory Visit (INDEPENDENT_AMBULATORY_CARE_PROVIDER_SITE_OTHER): Payer: BLUE CROSS/BLUE SHIELD | Admitting: Podiatry

## 2020-12-18 ENCOUNTER — Ambulatory Visit (INDEPENDENT_AMBULATORY_CARE_PROVIDER_SITE_OTHER): Payer: BLUE CROSS/BLUE SHIELD

## 2020-12-18 DIAGNOSIS — M7672 Peroneal tendinitis, left leg: Secondary | ICD-10-CM | POA: Diagnosis not present

## 2020-12-18 DIAGNOSIS — M79672 Pain in left foot: Secondary | ICD-10-CM

## 2020-12-18 MED ORDER — TRIAMCINOLONE ACETONIDE 10 MG/ML IJ SUSP
10.0000 mg | Freq: Once | INTRAMUSCULAR | Status: AC
  Administered 2020-12-18: 10 mg

## 2020-12-18 NOTE — Progress Notes (Signed)
Eft

## 2020-12-18 NOTE — Progress Notes (Signed)
Subjective:   Patient ID: Marie Kelly, female   DOB: 75 y.o.   MRN: [id number]   HPI Patient presents stating she started develop a lot of pain in her left outside foot in the last week and stated she had approximately 4 months of good results with medication   ROS      Objective:  Physical Exam  Neurovascular status intact with inflammation pain which is really occurred in the insertion of the peroneal tendon base of fifth metatarsal left     Assessment:  Peroneal tendinitis left with inflammation     Plan:  H&P reviewed condition and I do think we will continue along this course as its been 4 months but I did discuss possible MRI or orthotics.  Sterile prep injected the peroneal insertion 3 mg Dexasone Kenalog 5 mg Xylocaine advised on ice supportive shoe diclofenac gel and brace as needed at this time.  Reappoint to recheck as needed signed this

## 2020-12-26 ENCOUNTER — Other Ambulatory Visit: Payer: Medicare Other

## 2020-12-30 ENCOUNTER — Other Ambulatory Visit: Payer: Self-pay | Admitting: Podiatry

## 2020-12-30 DIAGNOSIS — M7672 Peroneal tendinitis, left leg: Secondary | ICD-10-CM

## 2021-01-12 DIAGNOSIS — L905 Scar conditions and fibrosis of skin: Secondary | ICD-10-CM | POA: Diagnosis not present

## 2021-01-12 DIAGNOSIS — D1801 Hemangioma of skin and subcutaneous tissue: Secondary | ICD-10-CM | POA: Diagnosis not present

## 2021-01-12 DIAGNOSIS — L814 Other melanin hyperpigmentation: Secondary | ICD-10-CM | POA: Diagnosis not present

## 2021-01-12 DIAGNOSIS — Z872 Personal history of diseases of the skin and subcutaneous tissue: Secondary | ICD-10-CM | POA: Diagnosis not present

## 2021-01-12 DIAGNOSIS — B0089 Other herpesviral infection: Secondary | ICD-10-CM | POA: Diagnosis not present

## 2021-01-12 DIAGNOSIS — L821 Other seborrheic keratosis: Secondary | ICD-10-CM | POA: Diagnosis not present

## 2021-01-21 ENCOUNTER — Other Ambulatory Visit: Payer: Self-pay

## 2021-01-21 ENCOUNTER — Ambulatory Visit
Admission: RE | Admit: 2021-01-21 | Discharge: 2021-01-21 | Disposition: A | Discharge status: Home / Self Care | Payer: Medicare Other | Source: Ambulatory Visit | Attending: Internal Medicine | Admitting: Internal Medicine

## 2021-01-21 ENCOUNTER — Ambulatory Visit: Admission: RE | Admit: 2021-01-21 | Payer: Medicare Other | Source: Ambulatory Visit

## 2021-01-21 DIAGNOSIS — R928 Other abnormal and inconclusive findings on diagnostic imaging of breast: Secondary | ICD-10-CM

## 2021-01-21 DIAGNOSIS — R922 Inconclusive mammogram: Secondary | ICD-10-CM | POA: Diagnosis not present

## 2021-01-29 ENCOUNTER — Encounter: Payer: Self-pay | Admitting: Internal Medicine

## 2021-01-29 DIAGNOSIS — D125 Benign neoplasm of sigmoid colon: Secondary | ICD-10-CM | POA: Diagnosis not present

## 2021-01-29 DIAGNOSIS — K640 First degree hemorrhoids: Secondary | ICD-10-CM | POA: Diagnosis not present

## 2021-01-29 DIAGNOSIS — Z800 Family history of malignant neoplasm of digestive organs: Secondary | ICD-10-CM | POA: Diagnosis not present

## 2021-01-29 DIAGNOSIS — K5730 Diverticulosis of large intestine without perforation or abscess without bleeding: Secondary | ICD-10-CM | POA: Diagnosis not present

## 2021-01-29 DIAGNOSIS — K625 Hemorrhage of anus and rectum: Secondary | ICD-10-CM | POA: Diagnosis not present

## 2021-01-29 LAB — HM COLONOSCOPY

## 2021-01-30 NOTE — Telephone Encounter (Addendum)
Noted - thanks for the update 

## 2021-01-30 NOTE — Telephone Encounter (Addendum)
Results have been abstracted.

## 2021-02-03 DIAGNOSIS — D125 Benign neoplasm of sigmoid colon: Secondary | ICD-10-CM | POA: Diagnosis not present

## 2021-02-05 DIAGNOSIS — H02422 Myogenic ptosis of left eyelid: Secondary | ICD-10-CM | POA: Diagnosis not present

## 2021-02-05 DIAGNOSIS — H0279 Other degenerative disorders of eyelid and periocular area: Secondary | ICD-10-CM | POA: Diagnosis not present

## 2021-02-05 DIAGNOSIS — H05412 Enophthalmos due to atrophy of orbital tissue, left eye: Secondary | ICD-10-CM | POA: Diagnosis not present

## 2021-02-05 DIAGNOSIS — H04123 Dry eye syndrome of bilateral lacrimal glands: Secondary | ICD-10-CM | POA: Diagnosis not present

## 2021-02-05 DIAGNOSIS — H02831 Dermatochalasis of right upper eyelid: Secondary | ICD-10-CM | POA: Diagnosis not present

## 2021-02-05 DIAGNOSIS — H401130 Primary open-angle glaucoma, bilateral, stage unspecified: Secondary | ICD-10-CM | POA: Diagnosis not present

## 2021-02-05 DIAGNOSIS — H02423 Myogenic ptosis of bilateral eyelids: Secondary | ICD-10-CM | POA: Diagnosis not present

## 2021-02-05 DIAGNOSIS — H02421 Myogenic ptosis of right eyelid: Secondary | ICD-10-CM | POA: Diagnosis not present

## 2021-02-05 DIAGNOSIS — H02834 Dermatochalasis of left upper eyelid: Secondary | ICD-10-CM | POA: Diagnosis not present

## 2021-02-09 ENCOUNTER — Other Ambulatory Visit: Payer: Self-pay

## 2021-02-09 ENCOUNTER — Other Ambulatory Visit: Payer: Self-pay | Admitting: Ophthalmology

## 2021-02-09 DIAGNOSIS — H05412 Enophthalmos due to atrophy of orbital tissue, left eye: Secondary | ICD-10-CM

## 2021-02-10 ENCOUNTER — Encounter (INDEPENDENT_AMBULATORY_CARE_PROVIDER_SITE_OTHER): Payer: Self-pay | Admitting: Ophthalmology

## 2021-02-10 ENCOUNTER — Ambulatory Visit (INDEPENDENT_AMBULATORY_CARE_PROVIDER_SITE_OTHER): Payer: Medicare Other | Admitting: Ophthalmology

## 2021-02-10 ENCOUNTER — Other Ambulatory Visit: Payer: Self-pay

## 2021-02-10 DIAGNOSIS — R931 Abnormal findings on diagnostic imaging of heart and coronary circulation: Secondary | ICD-10-CM

## 2021-02-10 DIAGNOSIS — H35371 Puckering of macula, right eye: Secondary | ICD-10-CM

## 2021-02-10 DIAGNOSIS — H33101 Unspecified retinoschisis, right eye: Secondary | ICD-10-CM

## 2021-02-10 DIAGNOSIS — H43813 Vitreous degeneration, bilateral: Secondary | ICD-10-CM | POA: Diagnosis not present

## 2021-02-10 NOTE — Assessment & Plan Note (Signed)
Stable over time with no impact on acuity will observe

## 2021-02-10 NOTE — Assessment & Plan Note (Signed)
Minor no topographic distortion OD observe

## 2021-02-10 NOTE — Assessment & Plan Note (Signed)

## 2021-02-10 NOTE — Progress Notes (Signed)
02/10/2021     CHIEF COMPLAINT Patient presents for  Chief Complaint  Patient presents with   Retina Follow Up      HISTORY OF PRESENT ILLNESS: Marie Kelly is a 75 y.o. female who presents to the clinic today for:   HPI     Retina Follow Up   Patient presents with  Other.  In right eye.  This started 1 year ago.  Severity is mild.  Duration of 1 year.  Since onset it is stable.        Comments   1year fu ou and oct Pt states VA OU stable since last visit. Pt denies FOL, floaters, or ocular pain OU.  Pt reports that she is using Timolol BID OU. She states that she was taken off of the Lumigan and is using another drop at night in both eyes but she is not able to remember the name of it.       Last edited by Kendra Opitz, COA on 02/10/2021 10:47 AM.      Referring physician: Burnis Medin, MD [place],  [place] [address]  HISTORICAL INFORMATION:   Selected notes from the MEDICAL RECORD NUMBER    Lab Results  Component Value Date   HGBA1C 5.4 09/17/2020     CURRENT MEDICATIONS: Current Outpatient Medications (Ophthalmic Drugs)  Medication Sig   bimatoprost (LUMIGAN) 0.01 % SOLN Place 1 drop into both eyes at bedtime.   No current facility-administered medications for this visit. (Ophthalmic Drugs)   Current Outpatient Medications (Other)  Medication Sig   albuterol (PROVENTIL HFA;VENTOLIN HFA) 108 (90 Base) MCG/ACT inhaler Inhale 2 puffs into the lungs every 6 (six) hours as needed for wheezing or shortness of breath.   carvedilol (COREG) 6.25 MG tablet TAKE 1 TABLET IN THE MORNING AND 2 TABLETS IN THE EVENING or as directed   Fluticasone-Salmeterol (ADVAIR) 250-50 MCG/DOSE AEPB USE 1 INHALATION TWICE A DAY   levothyroxine (SYNTHROID) 50 MCG tablet TAKE 1 TABLET DAILY (NEED APPOINTMENT, NO FURTHER REFILLS UNTIL SEEN)   meloxicam (MOBIC) 15 MG tablet Take 15 mg by mouth daily as needed for pain.   montelukast (SINGULAIR) 10 MG  tablet Take 1 tablet (10 mg total) by mouth at bedtime.   telmisartan (MICARDIS) 80 MG tablet Take 1 tablet (80 mg total) by mouth daily.   tolterodine (DETROL LA) 2 MG 24 hr capsule Take 1 capsule (2 mg total) by mouth daily.   valACYclovir (VALTREX) 1000 MG tablet    No current facility-administered medications for this visit. (Other)      REVIEW OF SYSTEMS:    ALLERGIES Allergies  Allergen Reactions   Hydrochlorothiazide     Muscles tightness and felt like she was hit by a truck    PAST MEDICAL HISTORY Past Medical History:  Diagnosis Date   Anemia    pt. denies   Arthritis    Asthma    Closed disp fx of right olecranon with intraartic exten w nonunion 04/19/2014   Colon polyps    Complication of anesthesia    2 days after surgery-muscles hurtand spasmed-needed a muscle relaxant    Diverticulosis    Fracture of right olecranon process 10/26/2013   GERD (gastroesophageal reflux disease)    had endo? taking nexium 3 x per week   Hyperlipidemia    had been on lipitor changes to pravachol cause of cost and tricor has since stopped  NO  se reported  Hypertension    Hypothyroidism     on meds for years   Impaired fasting glucose    Olecranon fracture 10/25/2013   right arm   PONV (postoperative nausea and vomiting)    Ventricular bigeminy seen on cardiac monitor    Cardiac work up Dr [facility]   Wears dentures    top-partial bottom   Wears glasses    Past Surgical History:  Procedure Laterality Date   BELPHAROPTOSIS REPAIR Bilateral    CATARACT EXTRACTION Bilateral 11/2016   CHOLECYSTECTOMY     COLONOSCOPY     HARVEST BONE GRAFT Right 04/19/2014   Procedure: HARVEST LEFT TIBIA BONE GRAFT ;  Surgeon: Johnny Bridge, MD;  Location: [facility];  Service: Orthopedics;  Laterality: Right;   HEMORROIDECTOMY     KNEE ARTHROSCOPY Right    NASAL SINUS SURGERY     ORIF ELBOW FRACTURE Right 10/26/2013   Procedure: RIGHT ELBOW FRACTUE OPEN  TREATMENT ULNAR PROXIMAL END OLECRANON PROCESS INCLUDES INTERNAL FIXATION;  Surgeon: Johnny Bridge, MD;  Location: [facility];  Service: Orthopedics;  Laterality: Right;   ORIF ELBOW FRACTURE Right 04/19/2014   Procedure: OPEN REDUCTION INTERNAL FIXATION (ORIF) RIGHT ELBOW/OLECRANON NONUNION FRACTURE WITH HARDWARE REMOVAL;  Surgeon: Johnny Bridge, MD;  Location: [facility];  Service: Orthopedics;  Laterality: Right;   TONSILLECTOMY     TUBAL LIGATION     uterus repair      FAMILY HISTORY Family History  Problem Relation Age of Onset   Stroke Mother    Diabetes Father    Heart disease Father    Alzheimer's disease Other    Stomach cancer Maternal Aunt    Colon cancer Neg Hx    Pancreatic cancer Neg Hx     SOCIAL HISTORY Social History   Tobacco Use   Smoking status: Former    Types: Cigarettes    Quit date: 10/25/1973    Years since quitting: 47.3   Smokeless tobacco: Never  Vaping Use   Vaping Use: Never used  Substance Use Topics   Alcohol use: Yes    Alcohol/week: 0.0 standard drinks    Comment: socially   Drug use: No         OPHTHALMIC EXAM:  Base Eye Exam     Visual Acuity (ETDRS)       Right Left   Dist cc 20/20 -1 20/20 -2    Correction: Glasses         Pupils       Pupils Dark Light Shape React APD   Right PERRL 4 3 Round Brisk None   Left PERRL 4 3 Round Brisk None         Visual Fields (Counting fingers)       Left Right    Full Full         Extraocular Movement       Right Left    Full Full         Neuro/Psych     Oriented x3: Yes   Mood/Affect: Normal         Dilation     Both eyes: 1.0% Mydriacyl, 2.5% Phenylephrine @ 10:49 AM           Slit Lamp and Fundus Exam     External Exam       Right Left   External Normal Normal         Slit Lamp Exam  Right Left   Lids/Lashes Normal Normal   Conjunctiva/Sclera White and quiet White and quiet   Cornea Clear Clear    Anterior Chamber Deep and quiet Deep and quiet   Iris Round and reactive Round and reactive   Lens Centered posterior chamber intraocular lens, 1+ Posterior capsular opacification Centered posterior chamber intraocular lens   Anterior Vitreous Normal Normal         Fundus Exam       Right Left   Posterior Vitreous Posterior vitreous detachment Posterior vitreous detachment   Disc Peripapillary atrophy, oblique insertion Peripapillary atrophy, oblique insertion   C/D Ratio 0.3 0.3   Macula Normal, no detectable CME nor pseudo cystoid CME Normal   Vessels Normal Normal   Periphery [place] degeneration, inferiorly [place]avingstone degeneration, inferiorly            IMAGING AND PROCEDURES  Imaging and Procedures for 02/10/21  OCT, Retina - OU - Both Eyes       Right Eye Quality was good. Scan locations included subfoveal. Central Foveal Thickness: 314. Progression has been stable. Findings include epiretinal membrane.   Left Eye Quality was good. Central Foveal Thickness: 241. Progression has been stable.   Notes Macular retinoschisis, stable over the last year.  OD, with no impact on acuity               ASSESSMENT/PLAN:  Foveomacular schisis, right eye Stable over time with no impact on acuity will observe  Right epiretinal membrane Minor no topographic distortion OD observe  Posterior vitreous detachment of both eyes  The nature of posterior vitreous detachment was discussed with the patient as well as its physiology, its age prevalence, and its possible implication regarding retinal breaks and detachment.  An informational brochure was offered to the patient.  All the patient's questions were answered.  The patient was asked to return if new or different flashes or floaters develops.   Patient was instructed to contact office immediately if any new changes were noticed. I explained to the patient that vitreous inside the eye is similar to jello inside a  bowl. As the jello melts it can start to pull away from the bowl, similarly the vitreous throughout our lives can begin to pull away from the retina. That process is called a posterior vitreous detachment. In some cases, the vitreous can tug hard enough on the retina to form a retinal tear. I discussed with the patient the signs and symptoms of a retinal detachment.  Do not rub the eye.       ICD-10-CM   1. Right epiretinal membrane  H35.371 OCT, Retina - OU - Both Eyes    2. Foveomacular schisis, right eye  H33.101     3. Posterior vitreous detachment of both eyes  H43.813       1.  History of bilateral high myopia.  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  I explained the patient that initial impact symptomatically could be with reading vision particularly in a gradual fashion over time.  2.  3.  Ophthalmic Meds Ordered this visit:  No orders of the defined types were placed in this encounter.      Return in about 1 year (around 02/10/2022) for DILATE OU, COLOR FP, OCT.  There are no Patient Instructions on file for this visit.   Explained the diagnoses, plan, and follow up with the patient and they expressed understanding.  Patient expressed understanding of the importance of proper follow up care.   Clent Demark Mccrae Speciale M.D. Diseases & Surgery of the Retina and Vitreous Retina [facility] Diabetic [place] 02/10/21     Abbreviations: M myopia (nearsighted); A astigmatism; H hyperopia (farsighted); P presbyopia; Mrx spectacle prescription;  CTL contact lenses; OD right eye; OS left eye; OU both eyes  XT exotropia; ET esotropia; PEK punctate epithelial keratitis; PEE punctate epithelial erosions; DES dry eye syndrome; MGD meibomian gland dysfunction; ATs artificial tears; PFAT's preservative free artificial tears; Winside nuclear sclerotic cataract; PSC posterior subcapsular cataract; ERM epi-retinal  membrane; PVD posterior vitreous detachment; RD retinal detachment; DM diabetes mellitus; DR diabetic retinopathy; NPDR non-proliferative diabetic retinopathy; PDR proliferative diabetic retinopathy; CSME clinically significant macular edema; DME diabetic macular edema; dbh dot blot hemorrhages; CWS cotton wool spot; POAG primary open angle glaucoma; C/D cup-to-disc ratio; HVF humphrey visual field; GVF goldmann visual field; OCT optical coherence tomography; IOP intraocular pressure; BRVO Branch retinal vein occlusion; CRVO central retinal vein occlusion; CRAO central retinal artery occlusion; BRAO branch retinal artery occlusion; RT retinal tear; SB scleral buckle; PPV pars plana vitrectomy; VH Vitreous hemorrhage; PRP panretinal laser photocoagulation; IVK intravitreal kenalog; VMT vitreomacular traction; MH Macular Kelly;  NVD neovascularization of the disc; NVE neovascularization elsewhere; AREDS age related eye disease study; ARMD age related macular degeneration; POAG primary open angle glaucoma; EBMD epithelial/anterior basement membrane dystrophy; ACIOL anterior chamber intraocular lens; IOL intraocular lens; PCIOL posterior chamber intraocular lens; Phaco/IOL phacoemulsification with intraocular lens placement; [place] photorefractive keratectomy; LASIK laser assisted in situ keratomileusis; HTN hypertension; DM diabetes mellitus; COPD chronic obstructive pulmonary disease

## 2021-02-23 ENCOUNTER — Encounter: Payer: Self-pay | Admitting: Internal Medicine

## 2021-02-25 NOTE — Progress Notes (Signed)
Tyson Dense MD Reason for referral-coronary artery disease  HPI: 75 year old female for evaluation of coronary artery disease at request of Shanon Ace MD.  Patient seen previously but not since 2015.  Echocardiogram February 2015 showed normal LV function and mild right ventricular enlargement.  Calcium score July 2022 234 which was 74th percentile.  She denies dyspnea, chest pain, palpitations or syncope.  No claudication.  Because of her elevated calcium score cardiology asked to evaluate.  Note her most recent cholesterol in May showed total 227 and LDL 163.  Current Outpatient Medications  Medication Sig Dispense Refill   albuterol (PROVENTIL HFA;VENTOLIN HFA) 108 (90 Base) MCG/ACT inhaler Inhale 2 puffs into the lungs every 6 (six) hours as needed for wheezing or shortness of breath. 1 Inhaler 1   betamethasone dipropionate 0.05 % cream betamethasone dipropionate 0.05 % topical cream  APPLY TO AFFECTED AREA TWICE A DAY 2 WEEKS ON 1 WEEK OFF AS NEEDED FLARE/ITCH     bimatoprost (LUMIGAN) 0.01 % SOLN Place 1 drop into both eyes at bedtime.     carvedilol (COREG) 6.25 MG tablet TAKE 1 TABLET IN THE MORNING AND 2 TABLETS IN THE EVENING or as directed 270 tablet 0   fluticasone (CUTIVATE) 0.05 % cream fluticasone propionate 0.05 % topical cream  APPLY A SMALL AMOUNT TO AFFECTED AREA TWICE A DAY AS NEEDED     Fluticasone-Salmeterol (ADVAIR) 250-50 MCG/DOSE AEPB USE 1 INHALATION TWICE A DAY 180 each 2   levothyroxine (SYNTHROID) 50 MCG tablet TAKE 1 TABLET DAILY (NEED APPOINTMENT, NO FURTHER REFILLS UNTIL SEEN) 90 tablet 3   montelukast (SINGULAIR) 10 MG tablet Take 1 tablet (10 mg total) by mouth at bedtime. 90 tablet 3   telmisartan (MICARDIS) 80 MG tablet Take 1 tablet (80 mg total) by mouth daily. 90 tablet 3   valACYclovir (VALTREX) 1000 MG tablet      meloxicam (MOBIC) 15 MG tablet Take 15 mg by mouth daily as needed for pain. (Patient not taking: Reported on 03/03/2021)      tolterodine (DETROL LA) 2 MG 24 hr capsule Take 1 capsule (2 mg total) by mouth daily. (Patient not taking: Reported on 03/03/2021) 90 capsule 3   No current facility-administered medications for this visit.    Allergies  Allergen Reactions   Hydrochlorothiazide     Muscles tightness and felt like she was hit by a truck     Past Medical History:  Diagnosis Date   Anemia    pt. denies   Arthritis    Asthma    Closed disp fx of right olecranon with intraartic exten w nonunion 04/19/2014   Colon polyps    Complication of anesthesia    2 days after surgery-muscles hurtand spasmed-needed a muscle relaxant    Diverticulosis    Fracture of right olecranon process 10/26/2013   GERD (gastroesophageal reflux disease)    had endo? taking nexium 3 x per week   Hyperlipidemia    had been on lipitor changes to pravachol cause of cost and tricor has since stopped  NO  se reported    Hypertension    Hypothyroidism     on meds for years   Impaired fasting glucose    Olecranon fracture 10/25/2013   right arm   PONV (postoperative nausea and vomiting)    Ventricular bigeminy seen on cardiac monitor    Cardiac work up Dr Berlinda [facility]   Wears dentures    top-partial bottom   Wears glasses  Past Surgical History:  Procedure Laterality Date   BELPHAROPTOSIS REPAIR Bilateral    CATARACT EXTRACTION Bilateral 11/2016   CHOLECYSTECTOMY     COLONOSCOPY     HARVEST BONE GRAFT Right 04/19/2014   Procedure: HARVEST LEFT TIBIA BONE GRAFT ;  Surgeon: Johnny Bridge, MD;  Location: [facility];  Service: Orthopedics;  Laterality: Right;   HEMORROIDECTOMY     KNEE ARTHROSCOPY Right    NASAL SINUS SURGERY     ORIF ELBOW FRACTURE Right 10/26/2013   Procedure: RIGHT ELBOW FRACTUE OPEN TREATMENT ULNAR PROXIMAL END OLECRANON PROCESS INCLUDES INTERNAL FIXATION;  Surgeon: Johnny Bridge, MD;  Location: [facility];  Service: Orthopedics;  Laterality: Right;   ORIF  ELBOW FRACTURE Right 04/19/2014   Procedure: OPEN REDUCTION INTERNAL FIXATION (ORIF) RIGHT ELBOW/OLECRANON NONUNION FRACTURE WITH HARDWARE REMOVAL;  Surgeon: Johnny Bridge, MD;  Location: [facility];  Service: Orthopedics;  Laterality: Right;   TONSILLECTOMY     TUBAL LIGATION     uterus repair      Social History   Socioeconomic History   Marital status: Married    Spouse name: Not on file   Number of children: 3   Years of education: Not on file   Highest education level: Not on file  Occupational History   Not on file  Tobacco Use   Smoking status: Former    Types: Cigarettes    Quit date: 10/25/1973    Years since quitting: 47.3   Smokeless tobacco: Never  Vaping Use   Vaping Use: Never used  Substance and Sexual Activity   Alcohol use: Yes    Alcohol/week: 0.0 standard drinks    Comment: socially   Drug use: No   Sexual activity: Yes  Other Topics Concern   Not on file  Social History Narrative   hh of 2    Married     Husband is Still in [place]  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    Gravida 3 para 3   Last td 6 2008   HS educated       REmote hx of tobacco 1.5 ppd for 6 years as a teen Q 51   Social Determinants of Radio broadcast assistant Strain: Not on file  Food Insecurity: Not on file  Transportation Needs: Not on file  Physical Activity: Not on file  Stress: Not on file  Social Connections: Not on file  Intimate Partner Violence: Not on file    Family History  Problem Relation Age of Onset   Stroke Mother    Diabetes Father    Heart disease Father    Alzheimer's disease Other    Stomach cancer Maternal Aunt    Colon cancer Neg Hx    Pancreatic cancer Neg Hx     ROS: no fevers or chills, productive cough, hemoptysis, dysphasia, odynophagia, melena, hematochezia, dysuria, hematuria, rash, seizure activity, orthopnea, PND, pedal edema,  claudication. Remaining systems are negative.  Physical Exam:   Blood pressure 135/78, pulse (!) 46, height 5' 4.8" (1.646 m), weight 169 lb 9.6 oz (76.9 kg), SpO2 98 %.  General:  Well developed/well nourished in NAD Skin warm/dry Patient not depressed No peripheral clubbing Back-normal HEENT-normal/normal eyelids Neck supple/normal carotid upstroke bilaterally; no bruits; no JVD; no thyromegaly chest - CTA/ normal expansion CV - RRR/normal S1 and S2; no  murmurs, rubs or gallops;  PMI nondisplaced Abdomen -NT/ND, no HSM, no mass, + bowel sounds, no bruit 2+ femoral pulses, no bruits Ext-no edema, chords, 2+ DP Neuro-grossly nonfocal  ECG -sinus bradycardia at a rate of 46, normal axis, no ST changes.  Personally reviewed  A/P  1 coronary artery disease-patient has an elevated calcium score but she is asymptomatic with no chest pain or dyspnea.  Plan medical therapy.  We will add a statin.  2 hyperlipidemia-she has not tolerated Lipitor in the past due to myalgias.  She is very hesitant to try statins but is willing to begin Crestor.  We will try 10 mg daily to see if she tolerates.  Check lipids and liver in 8 weeks.  We will advance if she indeed tolerates.  Otherwise can consider Zetia, Repatha or Praluent.  3 hypertension-blood pressure is controlled.  Her heart rate is 46 which she states typically in the 50s and she is asymptomatic.  We will continue carvedilol at present dose for now.  She will follow her heart rate and we potentially can discontinue in the future if there are issues with bradycardia.  Kirk Ruths, MD

## 2021-03-02 ENCOUNTER — Ambulatory Visit
Admission: RE | Admit: 2021-03-02 | Discharge: 2021-03-02 | Disposition: A | Discharge status: Home / Self Care | Payer: Medicare Other | Source: Ambulatory Visit | Attending: Ophthalmology | Admitting: Ophthalmology

## 2021-03-02 DIAGNOSIS — J3489 Other specified disorders of nose and nasal sinuses: Secondary | ICD-10-CM | POA: Diagnosis not present

## 2021-03-02 DIAGNOSIS — H05412 Enophthalmos due to atrophy of orbital tissue, left eye: Secondary | ICD-10-CM

## 2021-03-02 DIAGNOSIS — J329 Chronic sinusitis, unspecified: Secondary | ICD-10-CM | POA: Diagnosis not present

## 2021-03-02 DIAGNOSIS — H05411 Enophthalmos due to atrophy of orbital tissue, right eye: Secondary | ICD-10-CM | POA: Diagnosis not present

## 2021-03-02 MED ORDER — IOPAMIDOL (ISOVUE-370) INJECTION 76%
75.0000 mL | Freq: Once | INTRAVENOUS | Status: AC | PRN
  Administered 2021-03-02: 75 mL via INTRAVENOUS

## 2021-03-03 ENCOUNTER — Other Ambulatory Visit (HOSPITAL_BASED_OUTPATIENT_CLINIC_OR_DEPARTMENT_OTHER): Payer: Self-pay

## 2021-03-03 ENCOUNTER — Ambulatory Visit (INDEPENDENT_AMBULATORY_CARE_PROVIDER_SITE_OTHER): Payer: Medicare Other | Admitting: Cardiology

## 2021-03-03 ENCOUNTER — Other Ambulatory Visit: Payer: Self-pay

## 2021-03-03 ENCOUNTER — Encounter: Payer: Self-pay | Admitting: Cardiology

## 2021-03-03 VITALS — BP 135/78 | HR 46 | Ht 64.8 in | Wt 169.6 lb

## 2021-03-03 DIAGNOSIS — R931 Abnormal findings on diagnostic imaging of heart and coronary circulation: Secondary | ICD-10-CM

## 2021-03-03 DIAGNOSIS — E785 Hyperlipidemia, unspecified: Secondary | ICD-10-CM

## 2021-03-03 DIAGNOSIS — I10 Essential (primary) hypertension: Secondary | ICD-10-CM

## 2021-03-03 MED ORDER — ROSUVASTATIN CALCIUM 10 MG PO TABS
10.0000 mg | ORAL_TABLET | Freq: Every day | ORAL | 3 refills | Status: DC
  Filled 2021-03-03: qty 90, 90d supply, fill #0

## 2021-03-03 MED ORDER — ROSUVASTATIN CALCIUM 20 MG PO TABS
20.0000 mg | ORAL_TABLET | Freq: Every day | ORAL | 3 refills | Status: DC
  Filled 2021-03-03: qty 90, 90d supply, fill #0

## 2021-03-03 NOTE — Telephone Encounter (Signed)
Rx faxed

## 2021-03-03 NOTE — Addendum Note (Signed)
Addended by: Cristopher Estimable on: 03/03/2021 11:00 AM   Modules accepted: Orders

## 2021-03-03 NOTE — Patient Instructions (Addendum)
Medication Instructions:   START ROSUVASTATIN 10 MG ONCE DAILY  *If you need a refill on your cardiac medications before your next appointment, please call your pharmacy*   Lab Work:  Your physician recommends that you return for lab work in: [place]  If you have labs (blood work) drawn today and your tests are completely normal, you will receive your results only by: [place] (if you have MyChart) OR A paper copy in the mail If you have any lab test that is abnormal or we need to change your treatment, we will call you to review the results.   Follow-Up: At [facility], you and your health needs are our priority.  As part of our continuing mission to provide you with exceptional heart care, we have created designated Provider Care Teams.  These Care Teams include your primary Cardiologist (physician) and Advanced Practice Providers (APPs -  Physician Assistants and Nurse Practitioners) who all work together to provide you with the care you need, when you need it.  We recommend signing up for the patient portal called "MyChart".  Sign up information is provided on this After Visit Summary.  MyChart is used to connect with patients for Virtual Visits (Telemedicine).  Patients are able to view lab/test results, encounter notes, upcoming appointments, etc.  Non-urgent messages can be sent to your provider as well.   To learn more about what you can do with MyChart, go to [email or web address].    Your next appointment:   12 month(s)  The format for your next appointment:   In Person  Provider:   Kirk Ruths, MD

## 2021-03-09 ENCOUNTER — Other Ambulatory Visit: Payer: Self-pay

## 2021-03-09 ENCOUNTER — Ambulatory Visit (INDEPENDENT_AMBULATORY_CARE_PROVIDER_SITE_OTHER): Payer: Medicare Other | Admitting: Internal Medicine

## 2021-03-09 ENCOUNTER — Encounter: Payer: Self-pay | Admitting: Internal Medicine

## 2021-03-09 VITALS — BP 122/74 | HR 64 | Ht 64.8 in | Wt 169.0 lb

## 2021-03-09 DIAGNOSIS — E039 Hypothyroidism, unspecified: Secondary | ICD-10-CM | POA: Diagnosis not present

## 2021-03-09 DIAGNOSIS — M859 Disorder of bone density and structure, unspecified: Secondary | ICD-10-CM | POA: Diagnosis not present

## 2021-03-09 DIAGNOSIS — E871 Hypo-osmolality and hyponatremia: Secondary | ICD-10-CM

## 2021-03-09 LAB — COMPREHENSIVE METABOLIC PANEL
ALT: 34 U/L (ref 0–35)
AST: 25 U/L (ref 0–37)
Albumin: 4.5 g/dL (ref 3.5–5.2)
Alkaline Phosphatase: 47 U/L (ref 39–117)
BUN: 17 mg/dL (ref 6–23)
CO2: 24 mEq/L (ref 19–32)
Calcium: 9.8 mg/dL (ref 8.4–10.5)
Chloride: 101 mEq/L (ref 96–112)
Creatinine, Ser: 0.64 mg/dL (ref 0.40–1.20)
GFR: 86.17 mL/min (ref >60.00)
Glucose, Bld: 102 mg/dL — ABNORMAL HIGH (ref 70–99)
Potassium: 5.2 mEq/L — ABNORMAL HIGH (ref 3.5–5.1)
Sodium: 133 mEq/L — ABNORMAL LOW (ref 135–145)
Total Bilirubin: 0.5 mg/dL (ref 0.2–1.2)
Total Protein: 7.1 g/dL (ref 6.0–8.3)

## 2021-03-09 LAB — TSH: TSH: 5.35 u[IU]/mL (ref 0.35–5.50)

## 2021-03-09 LAB — VITAMIN D 25 HYDROXY (VIT D DEFICIENCY, FRACTURES): VITD: 47.7 ng/mL (ref 30.00–100.00)

## 2021-03-09 NOTE — Progress Notes (Signed)
Name: Marie Kelly  MRN/ DOB: [id number], 04/10/1946    Age/ Sex: 75 y.o., female    PCP: Burnis Medin, MD   Reason for Endocrinology Evaluation: Low Bone density      Date of Initial Endocrinology Evaluation: 03/09/2021     HPI: [name] is a 75 y.o. female with a past medical history of low bone density, Hypothyroidism, and Asthma . The patient presented for initial endocrinology clinic visit on 03/09/2021 for consultative assistance with her Low bone density .   Pt was diagnosed with low bone density : in 09/2014 with a right femoral neck T-Score -1.2   Menarche at age : 63 Menopausal at age : late 40's  Fracture Hx: elbow fracture - fell down the steps  Hx of HRT: no FH of osteoporosis or hip fracture: mother  Prior Hx of anti-estrogenic therapy :no  Prior Hx of anti-resorptive therapy : Fosamax - long time ago   She is on Calcium/Mg 1 tablet  Vitamin D 1000 iu daily   She walks for exercise     HISTORY:  Past Medical History:  Past Medical History:  Diagnosis Date   Anemia    pt. denies   Arthritis    Asthma    Closed disp fx of right olecranon with intraartic exten w nonunion 04/19/2014   Colon polyps    Complication of anesthesia    2 days after surgery-muscles hurtand spasmed-needed a muscle relaxant    Diverticulosis    Fracture of right olecranon process 10/26/2013   GERD (gastroesophageal reflux disease)    had endo? taking nexium 3 x per week   Hyperlipidemia    had been on lipitor changes to pravachol cause of cost and tricor has since stopped  NO  se reported    Hypertension    Hypothyroidism     on meds for years   Impaired fasting glucose    Olecranon fracture 10/25/2013   right arm   PONV (postoperative nausea and vomiting)    Ventricular bigeminy seen on cardiac monitor    Cardiac work up Dr Berlinda [facility]   Wears dentures    top-partial bottom   Wears glasses    Past Surgical History:  Past Surgical History:   Procedure Laterality Date   BELPHAROPTOSIS REPAIR Bilateral    CATARACT EXTRACTION Bilateral 11/2016   CHOLECYSTECTOMY     COLONOSCOPY     HARVEST BONE GRAFT Right 04/19/2014   Procedure: HARVEST LEFT TIBIA BONE GRAFT ;  Surgeon: Johnny Bridge, MD;  Location: [facility];  Service: Orthopedics;  Laterality: Right;   HEMORROIDECTOMY     KNEE ARTHROSCOPY Right    NASAL SINUS SURGERY     ORIF ELBOW FRACTURE Right 10/26/2013   Procedure: RIGHT ELBOW FRACTUE OPEN TREATMENT ULNAR PROXIMAL END OLECRANON PROCESS INCLUDES INTERNAL FIXATION;  Surgeon: Johnny Bridge, MD;  Location: [facility];  Service: Orthopedics;  Laterality: Right;   ORIF ELBOW FRACTURE Right 04/19/2014   Procedure: OPEN REDUCTION INTERNAL FIXATION (ORIF) RIGHT ELBOW/OLECRANON NONUNION FRACTURE WITH HARDWARE REMOVAL;  Surgeon: Johnny Bridge, MD;  Location: [facility];  Service: Orthopedics;  Laterality: Right;   TONSILLECTOMY     TUBAL LIGATION     uterus repair      Social History:  reports that she quit smoking about 47 years ago. Her smoking use included cigarettes. She has never used smokeless tobacco. She reports current alcohol use. She reports  that she does not use drugs. Family History: family history includes Alzheimer's disease in an other family member; Diabetes in her father; Heart disease in her father; Stomach cancer in her maternal aunt; Stroke in her mother.   HOME MEDICATIONS: Allergies as of 03/09/2021       Reactions   Hydrochlorothiazide    Muscles tightness and felt like she was hit by a truck        Medication List        Accurate as of March 09, 2021  7:15 AM. If you have any questions, ask your nurse or doctor.          albuterol 108 (90 Base) MCG/ACT inhaler Commonly known as: VENTOLIN HFA Inhale 2 puffs into the lungs every 6 (six) hours as needed for wheezing or shortness of breath.   betamethasone dipropionate 0.05 %  cream betamethasone dipropionate 0.05 % topical cream  APPLY TO AFFECTED AREA TWICE A DAY 2 WEEKS ON 1 WEEK OFF AS NEEDED FLARE/ITCH   bimatoprost 0.01 % Soln Commonly known as: LUMIGAN Place 1 drop into both eyes at bedtime.   carvedilol 6.25 MG tablet Commonly known as: COREG TAKE 1 TABLET IN THE MORNING AND 2 TABLETS IN THE EVENING or as directed   fluticasone 0.05 % cream Commonly known as: CUTIVATE fluticasone propionate 0.05 % topical cream  APPLY A SMALL AMOUNT TO AFFECTED AREA TWICE A DAY AS NEEDED   Fluticasone-Salmeterol 250-50 MCG/DOSE Aepb Commonly known as: ADVAIR USE 1 INHALATION TWICE A DAY   levothyroxine 50 MCG tablet Commonly known as: SYNTHROID TAKE 1 TABLET DAILY (NEED APPOINTMENT, NO FURTHER REFILLS UNTIL SEEN)   meloxicam 15 MG tablet Commonly known as: MOBIC Take 15 mg by mouth daily as needed for pain.   montelukast 10 MG tablet Commonly known as: SINGULAIR Take 1 tablet (10 mg total) by mouth at bedtime.   rosuvastatin 10 MG tablet Commonly known as: CRESTOR Take 1 tablet (10 mg total) by mouth daily.   telmisartan 80 MG tablet Commonly known as: MICARDIS Take 1 tablet (80 mg total) by mouth daily.   tolterodine 2 MG 24 hr capsule Commonly known as: DETROL LA Take 1 capsule (2 mg total) by mouth daily.   valACYclovir 1000 MG tablet Commonly known as: VALTREX          REVIEW OF SYSTEMS: A comprehensive ROS was conducted with the patient and is negative except as per HPI    OBJECTIVE:  VS: BP 122/74 (BP Location: Left Arm, Patient Position: Sitting, Cuff Size: Small)   Pulse 64   Ht 5' 4.8" (1.646 m)   Wt 169 lb (76.7 kg)   SpO2 96%   BMI 28.30 kg/m    Wt Readings from Last 3 Encounters:  03/03/21 169 lb 9.6 oz (76.9 kg)  11/26/20 172 lb 6.4 oz (78.2 kg)  09/17/20 175 lb (79.4 kg)     EXAM: General: Pt appears well and is in NAD  Neck: General: Supple without adenopathy. Thyroid: Thyroid size normal.  No goiter or  nodules appreciated.   Lungs: Clear with good BS bilat with no rales, rhonchi, or wheezes  Heart: Auscultation: RRR.  Abdomen: Normoactive bowel sounds, soft, nontender, without masses or organomegaly palpable  Extremities:  BL LE: No pretibial edema normal ROM and strength.  Skin: Hair: Texture and amount normal with gender appropriate distribution Skin Inspection: No rashes Skin Palpation: Skin temperature, texture, and thickness normal to palpation  Mental Status: Judgment, insight: Intact Orientation: Oriented  to time, place, and person Memory: Intact for recent and remote events Mood and affect: No depression, anxiety, or agitation     DATA REVIEWED:  Results for Molesky, Ja A "PAT" (MRN [id number]) as of 03/11/2021 11:59  Ref. Range 03/09/2021 10:43  Sodium Latest Ref Range: 135 - 145 mEq/L 133 (L)  Potassium Latest Ref Range: 3.5 - 5.1 mEq/L 5.2 No hemolysis seen (H)  Chloride Latest Ref Range: 96 - 112 mEq/L 101  CO2 Latest Ref Range: 19 - 32 mEq/L 24  Glucose Latest Ref Range: 70 - 99 mg/dL 102 (H)  BUN Latest Ref Range: 6 - 23 mg/dL 17  Creatinine Latest Ref Range: 0.40 - 1.20 mg/dL 0.64  Calcium Latest Ref Range: 8.4 - 10.5 mg/dL 9.8  Alkaline Phosphatase Latest Ref Range: 39 - 117 U/L 47  Albumin Latest Ref Range: 3.5 - 5.2 g/dL 4.5  AST Latest Ref Range: 0 - 37 U/L 25  ALT Latest Ref Range: 0 - 35 U/L 34  Total Protein Latest Ref Range: 6.0 - 8.3 g/dL 7.1  Total Bilirubin Latest Ref Range: 0.2 - 1.2 mg/dL 0.5  GFR Latest Ref Range: >60.00 mL/min 86.17  VITD Latest Ref Range: 30.00 - 100.00 ng/mL 47.70  PTH, Intact Latest Ref Range: 16 - 77 pg/mL 23  TSH Latest Ref Range: 0.35 - 5.50 uIU/mL 5.35    DXA 11/06/2020     Lumbar spine L1-L4 Femoral neck (FN) 33% distal radius  T-score 0.1 RFN: -1.9 LFN: -2.0 n/a  Change in BMD from previous DXA test (%) Down 1.4% Down 14.0% n/a  (*) statistically significant   FRAX score: 10 year major osteoporotic risk:  19.3%. 10 year hip fracture risk: 4.6%. The thresholds for treatment are 20% and 3%, respectively.  ASSESSMENT/PLAN/RECOMMENDATIONS:   Low Bone density :  - She meets criteria for anti-resorptive therapy with a FRAX of 4.6 % at the hips  , she used to be on Alendronate without reported intolerance, she has been off for years but at this time she would like to work on lifestyle changes to improve her bone health  - She understands she is at high risk for progressing to osteoporosis , but she declines bisphosphonate therapy at this time   Medications : Calcium 1200 mg a day Vitamin D 3 1000 IU daily Weightbearing exercise    2.  Hyponatremia/hyperkalemia:   -Could be due to assay interference, versus hypothyroid versus adrenal insufficiency. -We will adjust her LT-4 replacement therapy as her TSH is elevated - Patient will be advised to eliminate any alcoholic beverages, if she consumes any -We will recheck in 6 weeks  3. Hypothyroidism:  -Patient is clinically euthyroid -TSH elevated at 5.35 you IU/mL we will increase levothyroxine as below   Medication Stop levothyroxine 50 MCG Start levothyroxine 75 MCG daily     Signed electronically by: Mack Guise, MD  [facility] Endocrinology  [facility] [address] [place], [place] [address] Phone: [phone] FAX: [phone]   CC: Burnis Medin, [place] [address] Phone: [phone] Fax: [phone]   Return to Endocrinology clinic as below: Future Appointments  Date Time Provider [place]  03/09/2021 10:10 AM Orland Visconti, Melanie Crazier, MD [facility] None  02/11/2022 10:45 AM Rankin, Clent Demark, MD [facility] None

## 2021-03-09 NOTE — Patient Instructions (Signed)
Calcium 1200 mg daily  Vitamin D 1000 iu daily   Weight bearing Exercise  

## 2021-03-10 ENCOUNTER — Other Ambulatory Visit (HOSPITAL_BASED_OUTPATIENT_CLINIC_OR_DEPARTMENT_OTHER): Payer: Self-pay

## 2021-03-10 LAB — PARATHYROID HORMONE, INTACT (NO CA): PTH: 23 pg/mL (ref 16–77)

## 2021-03-10 MED ORDER — LEVOTHYROXINE SODIUM 75 MCG PO TABS
75.0000 ug | ORAL_TABLET | Freq: Every day | ORAL | 1 refills | Status: DC
  Filled 2021-03-10: qty 90, 90d supply, fill #0

## 2021-03-11 DIAGNOSIS — E871 Hypo-osmolality and hyponatremia: Secondary | ICD-10-CM | POA: Insufficient documentation

## 2021-03-12 ENCOUNTER — Encounter: Payer: Self-pay | Admitting: Internal Medicine

## 2021-04-14 ENCOUNTER — Encounter: Payer: Self-pay | Admitting: Cardiology

## 2021-04-14 DIAGNOSIS — R931 Abnormal findings on diagnostic imaging of heart and coronary circulation: Secondary | ICD-10-CM

## 2021-04-14 DIAGNOSIS — E785 Hyperlipidemia, unspecified: Secondary | ICD-10-CM

## 2021-04-19 ENCOUNTER — Encounter: Payer: Self-pay | Admitting: Internal Medicine

## 2021-04-22 ENCOUNTER — Other Ambulatory Visit (HOSPITAL_BASED_OUTPATIENT_CLINIC_OR_DEPARTMENT_OTHER): Payer: Self-pay

## 2021-04-22 ENCOUNTER — Ambulatory Visit (INDEPENDENT_AMBULATORY_CARE_PROVIDER_SITE_OTHER): Payer: Medicare Other | Admitting: Podiatry

## 2021-04-22 ENCOUNTER — Ambulatory Visit: Payer: Medicare Other | Admitting: Podiatry

## 2021-04-22 ENCOUNTER — Other Ambulatory Visit: Payer: Self-pay

## 2021-04-22 DIAGNOSIS — R931 Abnormal findings on diagnostic imaging of heart and coronary circulation: Secondary | ICD-10-CM

## 2021-04-22 DIAGNOSIS — M7672 Peroneal tendinitis, left leg: Secondary | ICD-10-CM | POA: Diagnosis not present

## 2021-04-22 MED ORDER — METHYLPREDNISOLONE 4 MG PO TBPK
ORAL_TABLET | ORAL | 0 refills | Status: DC
  Filled 2021-04-22: qty 21, 6d supply, fill #0

## 2021-04-22 MED ORDER — MELOXICAM 15 MG PO TABS
15.0000 mg | ORAL_TABLET | Freq: Every day | ORAL | 1 refills | Status: DC
  Filled 2021-04-22: qty 30, 30d supply, fill #0

## 2021-04-22 NOTE — Progress Notes (Signed)
HPI: 75 y.o. female presenting today for follow-up evaluation of left lateral foot pain.  The patient is very frustrated because she has been dealing with this pain for over 9 months now.  She says that she has routinely come in it about every 3 months for a steroid injection.  She wears a plantar fascial brace which she states does not help.  She continues to have chronic severe pain and tenderness to the lateral aspect of the left foot at the insertion of the peroneal tendon.  The patient takes over-the-counter Aleve as needed.  She says that she has not been prescribed any anti-inflammatories in the past.  She presents for further treatment and evaluation  Past Medical History:  Diagnosis Date   Anemia    pt. denies   Arthritis    Asthma    Closed disp fx of right olecranon with intraartic exten w nonunion 04/19/2014   Colon polyps    Complication of anesthesia    2 days after surgery-muscles hurtand spasmed-needed a muscle relaxant    Diverticulosis    Fracture of right olecranon process 10/26/2013   GERD (gastroesophageal reflux disease)    had endo? taking nexium 3 x per week   Hyperlipidemia    had been on lipitor changes to pravachol cause of cost and tricor has since stopped  NO  se reported    Hypertension    Hypothyroidism     on meds for years   Impaired fasting glucose    Olecranon fracture 10/25/2013   right arm   PONV (postoperative nausea and vomiting)    Ventricular bigeminy seen on cardiac monitor    Cardiac work up Dr Berlinda [facility]   Wears dentures    top-partial bottom   Wears glasses    Past Surgical History:  Procedure Laterality Date   BELPHAROPTOSIS REPAIR Bilateral    CATARACT EXTRACTION Bilateral 11/2016   CHOLECYSTECTOMY     COLONOSCOPY     HARVEST BONE GRAFT Right 04/19/2014   Procedure: HARVEST LEFT TIBIA BONE GRAFT ;  Surgeon: Johnny Bridge, MD;  Location: [facility];  Service: Orthopedics;  Laterality: Right;    HEMORROIDECTOMY     KNEE ARTHROSCOPY Right    NASAL SINUS SURGERY     ORIF ELBOW FRACTURE Right 10/26/2013   Procedure: RIGHT ELBOW FRACTUE OPEN TREATMENT ULNAR PROXIMAL END OLECRANON PROCESS INCLUDES INTERNAL FIXATION;  Surgeon: Johnny Bridge, MD;  Location: [facility];  Service: Orthopedics;  Laterality: Right;   ORIF ELBOW FRACTURE Right 04/19/2014   Procedure: OPEN REDUCTION INTERNAL FIXATION (ORIF) RIGHT ELBOW/OLECRANON NONUNION FRACTURE WITH HARDWARE REMOVAL;  Surgeon: Johnny Bridge, MD;  Location: [facility];  Service: Orthopedics;  Laterality: Right;   TONSILLECTOMY     TUBAL LIGATION     uterus repair     Physical Exam: General: The patient is alert and oriented x3 in no acute distress.  Dermatology: Skin is warm, dry and supple bilateral lower extremities. Negative for open lesions or macerations.  Vascular: Palpable pedal pulses bilaterally. No edema or erythema noted. Capillary refill within normal limits.  Neurological: Epicritic and protective threshold grossly intact bilaterally.   Musculoskeletal Exam: Significant pain on palpation noted to the fifth metatarsal tubercle at the insertion of the peroneal tendon left.  Radiographic Exam 05/23/2020 LT foot:  Normal osseous mineralization. Joint spaces preserved. No fracture/dislocation/boney destruction.  There is a small os peroneum noted on lateral view which appears well-rounded and corticated.  Assessment: 1.  Insertional peroneal tendinitis left; chronic, severe   Plan of Care:  1. Patient evaluated. X-Rays reviewed.  2. The patient has now had this pain and tenderness to the left foot for 1 year now.  We are going to go ahead and order MRI left foot to better visualize the peroneal tendon.  This is medically necessary since conservative treatments have been unsuccessful 3.  The patient has not been placed in the cam boot.  Cam boot dispensed.  Weightbearing as tolerated x4 weeks to help  immobilize the foot.  Discontinue the plantar fascial brace 4.  Prescription for Medrol Dosepak 5.  Prescription for meloxicam 15 mg daily 6.  Return to clinic after MRI to review results and discuss further treatment options      Edrick Kins, DPM [facility]  Dr. Edrick Kins, DPM    [address], [place] [address]                Office [phone]  Fax [phone]

## 2021-04-23 ENCOUNTER — Encounter (INDEPENDENT_AMBULATORY_CARE_PROVIDER_SITE_OTHER): Payer: Self-pay

## 2021-04-23 DIAGNOSIS — H02834 Dermatochalasis of left upper eyelid: Secondary | ICD-10-CM | POA: Diagnosis not present

## 2021-04-23 DIAGNOSIS — H04123 Dry eye syndrome of bilateral lacrimal glands: Secondary | ICD-10-CM | POA: Diagnosis not present

## 2021-04-23 DIAGNOSIS — H02423 Myogenic ptosis of bilateral eyelids: Secondary | ICD-10-CM | POA: Diagnosis not present

## 2021-04-23 DIAGNOSIS — H02421 Myogenic ptosis of right eyelid: Secondary | ICD-10-CM | POA: Diagnosis not present

## 2021-04-23 DIAGNOSIS — J329 Chronic sinusitis, unspecified: Secondary | ICD-10-CM | POA: Diagnosis not present

## 2021-04-23 DIAGNOSIS — H401130 Primary open-angle glaucoma, bilateral, stage unspecified: Secondary | ICD-10-CM | POA: Diagnosis not present

## 2021-04-23 DIAGNOSIS — H0279 Other degenerative disorders of eyelid and periocular area: Secondary | ICD-10-CM | POA: Diagnosis not present

## 2021-04-23 DIAGNOSIS — H05412 Enophthalmos due to atrophy of orbital tissue, left eye: Secondary | ICD-10-CM | POA: Diagnosis not present

## 2021-04-23 DIAGNOSIS — H02422 Myogenic ptosis of left eyelid: Secondary | ICD-10-CM | POA: Diagnosis not present

## 2021-04-23 DIAGNOSIS — H02831 Dermatochalasis of right upper eyelid: Secondary | ICD-10-CM | POA: Diagnosis not present

## 2021-04-24 ENCOUNTER — Other Ambulatory Visit (HOSPITAL_BASED_OUTPATIENT_CLINIC_OR_DEPARTMENT_OTHER): Payer: Self-pay

## 2021-04-24 MED ORDER — LATANOPROST 0.005 % OP SOLN
OPHTHALMIC | 1 refills | Status: DC
  Filled 2021-04-24: qty 2.5, 50d supply, fill #0

## 2021-04-27 ENCOUNTER — Encounter: Payer: Self-pay | Admitting: Internal Medicine

## 2021-04-27 ENCOUNTER — Other Ambulatory Visit (HOSPITAL_BASED_OUTPATIENT_CLINIC_OR_DEPARTMENT_OTHER): Payer: Self-pay

## 2021-04-28 ENCOUNTER — Other Ambulatory Visit (HOSPITAL_BASED_OUTPATIENT_CLINIC_OR_DEPARTMENT_OTHER): Payer: Self-pay

## 2021-04-28 MED ORDER — LATANOPROST 0.005 % OP SOLN
OPHTHALMIC | 1 refills | Status: DC
Start: 2021-04-28 — End: 2024-04-11

## 2021-05-05 ENCOUNTER — Telehealth: Payer: Self-pay | Admitting: Pharmacist

## 2021-05-05 ENCOUNTER — Ambulatory Visit (INDEPENDENT_AMBULATORY_CARE_PROVIDER_SITE_OTHER): Payer: Medicare Other | Admitting: Pharmacist

## 2021-05-05 ENCOUNTER — Other Ambulatory Visit: Payer: Self-pay

## 2021-05-05 VITALS — BP 122/78 | HR 63 | Resp 14 | Ht 64.0 in

## 2021-05-05 DIAGNOSIS — E785 Hyperlipidemia, unspecified: Secondary | ICD-10-CM

## 2021-05-05 DIAGNOSIS — R931 Abnormal findings on diagnostic imaging of heart and coronary circulation: Secondary | ICD-10-CM

## 2021-05-05 NOTE — Telephone Encounter (Signed)
Please complete prior authorization for:  Name of medication, dose, and frequency Repatha 140mg  sq q 14 days or Praluent 75mg  sq q 14 days  Lab Orders Requested? yes  Which labs? Lipid panel  Estimated date for labs to be scheduled 2-3 months  Does patient need activated copay card? no

## 2021-05-05 NOTE — Patient Instructions (Addendum)
It was nice meeting you today!  We would like your LDL (bad cholesterol) to be less than 70  We will start a new medication called Repatha or Praluent which you will inject once every 2 weeks  We will complete the prior authorization for you and contact you when it is complete  Once you start the medication we will recheck your cholesterol in 2-3 months  Please call with any questions!  Karren Cobble, PharmD, BCACP, [place], [place], [place], [place], [address] Phone: [phone], Fax: [phone]   For more information you can go to:  [email or web address]

## 2021-05-05 NOTE — Progress Notes (Signed)
Patient ID: Marie Kelly                 DOB: 1945-05-24                    MRN: [id number]     HPI: Marie Kelly is a 75 y.o. female patient referred to lipid clinic by Dr Stanford Breed. PMH is significant for HTN, HLD, and elevated coronary calcium score.  Patient presents today in good spirits.  Is having tendonitis pain in her left foot and currently walking in a boot which has limited her physical activity. Previously was walking over 10,000 steps per day. Drinks alcohol socially.  Is doing a modified ketogenic diet and reports she has lost weight and her triglycerides have decreased.  Has history of CAD on mother and father side.  Mother had CVA and father had DM also.  Has trialed on both atorvastatin and rosuvastatin. Both caused severe myalgias and she could not tolerate.    Current Medications: N/A  Intolerances:  Rosuvastatin  Atorvasatatin   Risk Factors:  CAD HLD HTN Elevated coronary calcium score  LDL goal: <70  Labs: TC 227, Trigs 67, HDL 50, LDL 163  Coronary calcium score of 234. This was 74th percentile for age-, race-, and sex-matched controls.    Past Medical History:  Diagnosis Date   Anemia    pt. denies   Arthritis    Asthma    Closed disp fx of right olecranon with intraartic exten w nonunion 04/19/2014   Colon polyps    Complication of anesthesia    2 days after surgery-muscles hurtand spasmed-needed a muscle relaxant    Diverticulosis    Fracture of right olecranon process 10/26/2013   GERD (gastroesophageal reflux disease)    had endo? taking nexium 3 x per week   Hyperlipidemia    had been on lipitor changes to pravachol cause of cost and tricor has since stopped  NO  se reported    Hypertension    Hypothyroidism     on meds for years   Impaired fasting glucose    Olecranon fracture 10/25/2013   right arm   PONV (postoperative nausea and vomiting)    Ventricular bigeminy seen on cardiac monitor    Cardiac work up Dr Berlinda Last  Heart   Wears dentures    top-partial bottom   Wears glasses     Current Outpatient Medications on File Prior to Visit  Medication Sig Dispense Refill   betamethasone dipropionate 0.05 % cream betamethasone dipropionate 0.05 % topical cream  APPLY TO AFFECTED AREA TWICE A DAY 2 WEEKS ON 1 WEEK OFF AS NEEDED FLARE/ITCH     carvedilol (COREG) 6.25 MG tablet TAKE 1 TABLET IN THE MORNING AND 2 TABLETS IN THE EVENING or as directed 270 tablet 0   fluticasone (CUTIVATE) 0.05 % cream fluticasone propionate 0.05 % topical cream  APPLY A SMALL AMOUNT TO AFFECTED AREA TWICE A DAY AS NEEDED     Fluticasone-Salmeterol (ADVAIR) 250-50 MCG/DOSE AEPB USE 1 INHALATION TWICE A DAY 180 each 2   latanoprost (XALATAN) 0.005 % ophthalmic solution Instill 1 drop into each eye each everning 2.5 mL 1   levothyroxine (SYNTHROID) 75 MCG tablet Take 1 tablet (75 mcg total) by mouth daily. 90 tablet 1   bimatoprost (LUMIGAN) 0.01 % SOLN Place 1 drop into both eyes at bedtime. (Patient not taking: Reported on 05/05/2021)     meloxicam (MOBIC) 15 MG tablet Take 1 tablet (  15 mg total) by mouth daily. 30 tablet 1   methylPREDNISolone (MEDROL DOSEPAK) 4 MG TBPK tablet 6 day dose pack - Take as directed on package 21 tablet 0   montelukast (SINGULAIR) 10 MG tablet Take 1 tablet (10 mg total) by mouth at bedtime. 90 tablet 3   telmisartan (MICARDIS) 80 MG tablet Take 1 tablet (80 mg total) by mouth daily. 90 tablet 3   valACYclovir (VALTREX) 1000 MG tablet      No current facility-administered medications on file prior to visit.    Allergies  Allergen Reactions   Crestor [Rosuvastatin]     myalgias   Hydrochlorothiazide     Muscles tightness and felt like she was hit by a truck   Lipitor [Atorvastatin]     Myalgias     Assessment/Plan:  1. Hyperlipidemia - Patient's most recent LDL 163 which is above goal of <70.  Unfortunately she can not tolerate high intensity statins and needs >50% LDL lowering. Due to  elevated coronary calcium score, Zetia will not be an effective option. Recommended patient start PCSK9i and she is agreeable.  Using demo pen, educated patient on mechanism of action, storage, site selection, administration, and possible adverse effects.  Patient was able to demonstrate in room.  Will complete PA and contact patient.  Recheck lipid panel in 2-3 months.  Karren Cobble, PharmD, BCACP, [place], [place], [place], [place], [address] Phone: [phone], Fax: [phone]

## 2021-05-06 ENCOUNTER — Other Ambulatory Visit (HOSPITAL_BASED_OUTPATIENT_CLINIC_OR_DEPARTMENT_OTHER): Payer: Self-pay

## 2021-05-06 NOTE — Telephone Encounter (Signed)
Pa sent for repatha 140mg  to [facility]. Lipid panel ordered and released.

## 2021-05-06 NOTE — Addendum Note (Signed)
Addended by: Allean Found on: 05/06/2021 01:42 PM   Modules accepted: Orders

## 2021-05-08 ENCOUNTER — Ambulatory Visit
Admission: RE | Admit: 2021-05-08 | Discharge: 2021-05-08 | Disposition: A | Discharge status: Home / Self Care | Payer: Medicare Other | Source: Ambulatory Visit | Attending: Podiatry | Admitting: Podiatry

## 2021-05-08 ENCOUNTER — Other Ambulatory Visit: Payer: Self-pay

## 2021-05-08 DIAGNOSIS — R600 Localized edema: Secondary | ICD-10-CM | POA: Diagnosis not present

## 2021-05-08 DIAGNOSIS — M7672 Peroneal tendinitis, left leg: Secondary | ICD-10-CM

## 2021-05-08 DIAGNOSIS — M19072 Primary osteoarthritis, left ankle and foot: Secondary | ICD-10-CM | POA: Diagnosis not present

## 2021-05-08 DIAGNOSIS — M25472 Effusion, left ankle: Secondary | ICD-10-CM | POA: Diagnosis not present

## 2021-05-08 DIAGNOSIS — M7989 Other specified soft tissue disorders: Secondary | ICD-10-CM | POA: Diagnosis not present

## 2021-05-13 ENCOUNTER — Other Ambulatory Visit: Payer: Self-pay

## 2021-05-13 ENCOUNTER — Encounter: Payer: Self-pay | Admitting: Podiatry

## 2021-05-13 ENCOUNTER — Telehealth: Payer: Self-pay | Admitting: Podiatry

## 2021-05-13 ENCOUNTER — Ambulatory Visit (INDEPENDENT_AMBULATORY_CARE_PROVIDER_SITE_OTHER): Payer: Medicare Other | Admitting: Podiatry

## 2021-05-13 DIAGNOSIS — M7672 Peroneal tendinitis, left leg: Secondary | ICD-10-CM

## 2021-05-13 DIAGNOSIS — M79672 Pain in left foot: Secondary | ICD-10-CM | POA: Diagnosis not present

## 2021-05-13 DIAGNOSIS — R931 Abnormal findings on diagnostic imaging of heart and coronary circulation: Secondary | ICD-10-CM

## 2021-05-13 NOTE — Telephone Encounter (Signed)
Send this to Dr. Amalia Hailey to review with her as he ordered it. He should be able to talk to her by end of week or early next week

## 2021-05-13 NOTE — Telephone Encounter (Signed)
Patient would like a call to go over MRI results, she is unable to come in the office being that her husband had knee sx. She is unable to leave him

## 2021-05-13 NOTE — Progress Notes (Signed)
Subjective:   Patient ID: Marie Kelly, female   DOB: 75 y.o.   MRN: [id number]   HPI Patient presents stating she seems a little better in the boot but she wants to review the MRI and see what else might be considered   ROS      Objective:  Physical Exam  Neurovascular status intact with most pain centered on the fifth metatarsal base left with inflammation of the area no swelling or active pathology occurring with mild erythema within the area     Assessment:  Still appears to be a peroneal tendinitis which is getting slightly better with immobilization but still painful with MRI that was done     Plan:  H&P reviewed MRI indicating possibility for a CMR but I did not see pathology associated with the area of discomfort.  We will continue immobilization may consider physical therapy or orthotics with valgus wedging and ultimately may require exploratory surgery

## 2021-05-16 ENCOUNTER — Encounter: Payer: Self-pay | Admitting: Cardiology

## 2021-05-19 ENCOUNTER — Other Ambulatory Visit (HOSPITAL_BASED_OUTPATIENT_CLINIC_OR_DEPARTMENT_OTHER): Payer: Self-pay

## 2021-05-19 MED ORDER — REPATHA SURECLICK 140 MG/ML ~~LOC~~ SOAJ
140.0000 mg | SUBCUTANEOUS | 11 refills | Status: DC
  Filled 2021-05-19: qty 2, 28d supply, fill #0
  Filled 2021-06-11 – 2021-06-12 (×2): qty 2, 28d supply, fill #1
  Filled 2021-07-07: qty 2, 28d supply, fill #2
  Filled 2021-08-04: qty 2, 28d supply, fill #3
  Filled 2021-08-28: qty 2, 28d supply, fill #4
  Filled 2021-10-05: qty 2, 28d supply, fill #5
  Filled 2021-10-06 – 2021-11-02 (×2): qty 2, 28d supply, fill #6
  Filled 2021-11-30: qty 2, 28d supply, fill #7
  Filled 2021-12-07: qty 6, 84d supply, fill #7
  Filled ????-??-??: fill #1

## 2021-05-20 ENCOUNTER — Other Ambulatory Visit (HOSPITAL_BASED_OUTPATIENT_CLINIC_OR_DEPARTMENT_OTHER): Payer: Self-pay

## 2021-05-21 ENCOUNTER — Encounter: Payer: Self-pay | Admitting: Cardiology

## 2021-05-28 ENCOUNTER — Encounter: Payer: Self-pay | Admitting: Internal Medicine

## 2021-05-28 ENCOUNTER — Other Ambulatory Visit: Payer: Self-pay

## 2021-05-28 DIAGNOSIS — E871 Hypo-osmolality and hyponatremia: Secondary | ICD-10-CM

## 2021-05-28 DIAGNOSIS — E039 Hypothyroidism, unspecified: Secondary | ICD-10-CM

## 2021-05-28 DIAGNOSIS — M859 Disorder of bone density and structure, unspecified: Secondary | ICD-10-CM

## 2021-05-28 NOTE — Addendum Note (Signed)
Addended by: Kaylyn Lim I on: 05/28/2021 01:25 PM   Modules accepted: Orders

## 2021-06-02 ENCOUNTER — Encounter: Payer: Self-pay | Admitting: Internal Medicine

## 2021-06-02 DIAGNOSIS — M859 Disorder of bone density and structure, unspecified: Secondary | ICD-10-CM | POA: Diagnosis not present

## 2021-06-02 DIAGNOSIS — E871 Hypo-osmolality and hyponatremia: Secondary | ICD-10-CM | POA: Diagnosis not present

## 2021-06-02 DIAGNOSIS — E039 Hypothyroidism, unspecified: Secondary | ICD-10-CM | POA: Diagnosis not present

## 2021-06-03 ENCOUNTER — Encounter: Payer: Self-pay | Admitting: Internal Medicine

## 2021-06-03 DIAGNOSIS — E039 Hypothyroidism, unspecified: Secondary | ICD-10-CM

## 2021-06-03 LAB — BASIC METABOLIC PANEL
BUN/Creatinine Ratio: 22 (ref 12–28)
BUN: 16 mg/dL (ref 8–27)
CO2: 25 mmol/L (ref 20–29)
Calcium: 10.3 mg/dL (ref 8.7–10.3)
Chloride: 99 mmol/L (ref 96–106)
Creatinine, Ser: 0.74 mg/dL (ref 0.57–1.00)
Glucose: 102 mg/dL — ABNORMAL HIGH (ref 70–99)
Potassium: 4.9 mmol/L (ref 3.5–5.2)
Sodium: 138 mmol/L (ref 134–144)
eGFR: 84 mL/min/{1.73_m2} (ref >59)

## 2021-06-03 LAB — SODIUM, URINE, RANDOM

## 2021-06-03 LAB — OSMOLALITY: Osmolality Meas: 282 mOsmol/kg (ref 280–301)

## 2021-06-03 LAB — OSMOLALITY, URINE

## 2021-06-03 LAB — TSH: TSH: 4.81 u[IU]/mL — ABNORMAL HIGH (ref 0.450–4.500)

## 2021-06-03 MED ORDER — LEVOTHYROXINE SODIUM 75 MCG PO TABS: 75.0000 ug | ORAL_TABLET | ORAL | 1 refills | Status: DC

## 2021-06-08 ENCOUNTER — Other Ambulatory Visit: Payer: Self-pay

## 2021-06-08 ENCOUNTER — Encounter: Payer: Self-pay | Admitting: Podiatry

## 2021-06-08 ENCOUNTER — Ambulatory Visit (INDEPENDENT_AMBULATORY_CARE_PROVIDER_SITE_OTHER): Payer: Medicare Other | Admitting: Podiatry

## 2021-06-08 DIAGNOSIS — M7672 Peroneal tendinitis, left leg: Secondary | ICD-10-CM

## 2021-06-08 MED ORDER — TRIAMCINOLONE ACETONIDE 10 MG/ML IJ SUSP
10.0000 mg | Freq: Once | INTRAMUSCULAR | Status: AC
  Administered 2021-06-08: 10 mg

## 2021-06-09 ENCOUNTER — Encounter: Payer: Self-pay | Admitting: Cardiology

## 2021-06-10 NOTE — Progress Notes (Signed)
Subjective:   Patient ID: Marie Kelly, female   DOB: 76 y.o.   MRN: [id number]   HPI Patient has been wearing the boot states it seems to be getting somewhat better but still a problem for her and she feels like still shoe gear is uncomfortable   ROS      Objective:  Physical Exam  Neurovascular status intact diminishment of edema around the base of the fifth metatarsal left work no muscle strength loss or what appears to be artery pathology     Assessment:  Peroneal tendinitis left that seems to gradually be improving with immobilization anti-inflammatories     Plan:  H&P reviewed condition I have recommended 1 more injection for this reviewed risk of this and chances for rupture and she wants to go ahead undergo this.  We reviewed her MRI which did not indicate pathology of the tendon itself and today I did do sterile prep and carefully injected just the sheath of the tendon 3 mg dexamethasone Kenalog 5 mg Xylocaine and continue boot usage with gradual reduction starting in 2 weeks

## 2021-06-11 ENCOUNTER — Other Ambulatory Visit (HOSPITAL_BASED_OUTPATIENT_CLINIC_OR_DEPARTMENT_OTHER): Payer: Self-pay

## 2021-06-12 ENCOUNTER — Other Ambulatory Visit (HOSPITAL_BASED_OUTPATIENT_CLINIC_OR_DEPARTMENT_OTHER): Payer: Self-pay

## 2021-06-12 ENCOUNTER — Other Ambulatory Visit: Payer: Self-pay

## 2021-06-12 DIAGNOSIS — E039 Hypothyroidism, unspecified: Secondary | ICD-10-CM

## 2021-06-12 NOTE — Addendum Note (Signed)
Addended by: Kaylyn Lim I on: 06/12/2021 08:40 AM   Modules accepted: Orders

## 2021-06-16 ENCOUNTER — Encounter: Payer: Self-pay | Admitting: *Deleted

## 2021-06-18 ENCOUNTER — Other Ambulatory Visit: Payer: Self-pay | Admitting: Internal Medicine

## 2021-06-19 MED ORDER — CARVEDILOL 6.25 MG PO TABS: ORAL_TABLET | ORAL | 0 refills | Status: DC

## 2021-06-23 ENCOUNTER — Encounter: Payer: Self-pay | Admitting: Cardiology

## 2021-06-25 DIAGNOSIS — H401232 Low-tension glaucoma, bilateral, moderate stage: Secondary | ICD-10-CM | POA: Diagnosis not present

## 2021-06-25 DIAGNOSIS — Z961 Presence of intraocular lens: Secondary | ICD-10-CM | POA: Diagnosis not present

## 2021-06-26 ENCOUNTER — Other Ambulatory Visit (HOSPITAL_BASED_OUTPATIENT_CLINIC_OR_DEPARTMENT_OTHER): Payer: Self-pay

## 2021-06-26 DIAGNOSIS — Z9889 Other specified postprocedural states: Secondary | ICD-10-CM | POA: Diagnosis not present

## 2021-06-26 DIAGNOSIS — Z8709 Personal history of other diseases of the respiratory system: Secondary | ICD-10-CM | POA: Diagnosis not present

## 2021-06-26 DIAGNOSIS — J3089 Other allergic rhinitis: Secondary | ICD-10-CM | POA: Diagnosis not present

## 2021-06-26 DIAGNOSIS — J324 Chronic pansinusitis: Secondary | ICD-10-CM | POA: Diagnosis not present

## 2021-06-26 DIAGNOSIS — Z87891 Personal history of nicotine dependence: Secondary | ICD-10-CM | POA: Diagnosis not present

## 2021-06-26 MED ORDER — PREDNISONE 10 MG PO TABS
ORAL_TABLET | ORAL | 0 refills | Status: DC
  Filled 2021-06-26: qty 18, 7d supply, fill #0

## 2021-06-26 MED ORDER — LEVOFLOXACIN 750 MG PO TABS
ORAL_TABLET | ORAL | 0 refills | Status: DC
  Filled 2021-06-26: qty 10, 10d supply, fill #0

## 2021-06-26 MED ORDER — DOXYCYCLINE HYCLATE 100 MG PO TABS
ORAL_TABLET | ORAL | 0 refills | Status: DC
  Filled 2021-06-26: qty 20, 10d supply, fill #0

## 2021-07-02 ENCOUNTER — Other Ambulatory Visit (HOSPITAL_BASED_OUTPATIENT_CLINIC_OR_DEPARTMENT_OTHER): Payer: Self-pay

## 2021-07-02 MED ORDER — SULFAMETHOXAZOLE-TRIMETHOPRIM 800-160 MG PO TABS
ORAL_TABLET | ORAL | 0 refills | Status: DC
  Filled 2021-07-02 (×2): qty 20, 10d supply, fill #0

## 2021-07-06 ENCOUNTER — Telehealth: Payer: Self-pay

## 2021-07-06 ENCOUNTER — Ambulatory Visit (INDEPENDENT_AMBULATORY_CARE_PROVIDER_SITE_OTHER): Payer: Medicare Other

## 2021-07-06 ENCOUNTER — Ambulatory Visit: Payer: Medicare Other | Admitting: Podiatry

## 2021-07-06 ENCOUNTER — Encounter: Payer: Self-pay | Admitting: Podiatry

## 2021-07-06 ENCOUNTER — Other Ambulatory Visit: Payer: Self-pay

## 2021-07-06 DIAGNOSIS — M7672 Peroneal tendinitis, left leg: Secondary | ICD-10-CM

## 2021-07-06 DIAGNOSIS — M7671 Peroneal tendinitis, right leg: Secondary | ICD-10-CM

## 2021-07-06 DIAGNOSIS — M79672 Pain in left foot: Secondary | ICD-10-CM

## 2021-07-06 NOTE — Progress Notes (Signed)
SITUATION Reason for Consult: Evaluation for Bilateral Custom Foot Orthoses Patient / Caregiver Report: Patient is ready for foot orthotics  OBJECTIVE DATA: Patient History / Diagnosis:    ICD-10-CM   1. Peroneal tendinitis of left lower extremity  M76.72     2. Left foot pain  M79.672       Current or Previous Devices: None and no history  Foot Examination: Skin presentation:   Intact Ulcers & Callousing:   None and no history Toe / Foot Deformities:  None Weight Bearing Presentation:  Rectus Sensation:    Intact  ORTHOTIC RECOMMENDATION Recommended Device: 1x pair of custom functional foot orthotics  GOALS OF ORTHOSES - Reduce Pain - Prevent Foot Deformity - Prevent Progression of Further Foot Deformity - Relieve Pressure - Improve the Overall Biomechanical Function of the Foot and Lower Extremity.  ACTIONS PERFORMED Patient was casted for Foot Orthoses via crush box. Procedure was explained and patient tolerated procedure well. All questions were answered and concerns addressed.  PLAN Potential out of pocket cost was communicated to patient. Casts are to be sent to [facility] for fabrication. Patient is to be called for fitting when devices are ready.

## 2021-07-06 NOTE — Progress Notes (Signed)
Subjective:   Patient ID: Marie Kelly, female   DOB: 76 y.o.   MRN: [id number]   HPI Patient presents stating it is better but it still there.  States that its the outside of the left foot that she did take a 30-minute walk and it did mildly flareup   ROS      Objective:  Physical Exam  Neurovascular status intact with still some discomfort in the lateral side of the left foot around the fifth metatarsal base localized with no abject swelling noted     Assessment:  Hopeful gradual improvement of peroneal tendinitis left     Plan:  Reviewed condition and recommended orthotics with valgus wedging left and work with ped orthotist who agrees.  I did discuss topical medicines to use before walking ice after along with utilization of anti-inflammatories immobilization as needed with casting done today

## 2021-07-06 NOTE — Telephone Encounter (Signed)
Casts Sent to [facility]

## 2021-07-07 ENCOUNTER — Other Ambulatory Visit (HOSPITAL_BASED_OUTPATIENT_CLINIC_OR_DEPARTMENT_OTHER): Payer: Self-pay

## 2021-07-07 ENCOUNTER — Encounter (INDEPENDENT_AMBULATORY_CARE_PROVIDER_SITE_OTHER): Payer: Self-pay | Admitting: Ophthalmology

## 2021-07-09 ENCOUNTER — Encounter (INDEPENDENT_AMBULATORY_CARE_PROVIDER_SITE_OTHER): Payer: Self-pay

## 2021-07-09 DIAGNOSIS — H02421 Myogenic ptosis of right eyelid: Secondary | ICD-10-CM | POA: Diagnosis not present

## 2021-07-09 DIAGNOSIS — H0279 Other degenerative disorders of eyelid and periocular area: Secondary | ICD-10-CM | POA: Diagnosis not present

## 2021-07-09 DIAGNOSIS — H02831 Dermatochalasis of right upper eyelid: Secondary | ICD-10-CM | POA: Diagnosis not present

## 2021-07-09 DIAGNOSIS — H04123 Dry eye syndrome of bilateral lacrimal glands: Secondary | ICD-10-CM | POA: Diagnosis not present

## 2021-07-14 DIAGNOSIS — D225 Melanocytic nevi of trunk: Secondary | ICD-10-CM | POA: Diagnosis not present

## 2021-07-14 DIAGNOSIS — L723 Sebaceous cyst: Secondary | ICD-10-CM | POA: Diagnosis not present

## 2021-07-14 DIAGNOSIS — Z872 Personal history of diseases of the skin and subcutaneous tissue: Secondary | ICD-10-CM | POA: Diagnosis not present

## 2021-07-14 DIAGNOSIS — D2262 Melanocytic nevi of left upper limb, including shoulder: Secondary | ICD-10-CM | POA: Diagnosis not present

## 2021-07-14 DIAGNOSIS — L821 Other seborrheic keratosis: Secondary | ICD-10-CM | POA: Diagnosis not present

## 2021-07-14 DIAGNOSIS — D485 Neoplasm of uncertain behavior of skin: Secondary | ICD-10-CM | POA: Diagnosis not present

## 2021-07-14 DIAGNOSIS — L814 Other melanin hyperpigmentation: Secondary | ICD-10-CM | POA: Diagnosis not present

## 2021-07-16 DIAGNOSIS — R931 Abnormal findings on diagnostic imaging of heart and coronary circulation: Secondary | ICD-10-CM | POA: Diagnosis not present

## 2021-07-16 DIAGNOSIS — E785 Hyperlipidemia, unspecified: Secondary | ICD-10-CM | POA: Diagnosis not present

## 2021-07-17 ENCOUNTER — Encounter: Payer: Self-pay | Admitting: Cardiology

## 2021-07-17 LAB — HEPATIC FUNCTION PANEL
ALT: 36 IU/L — ABNORMAL HIGH (ref 0–32)
AST: 19 IU/L (ref 0–40)
Albumin: 4.3 g/dL (ref 3.7–4.7)
Alkaline Phosphatase: 48 IU/L (ref 44–121)
Bilirubin Total: 0.5 mg/dL (ref 0.0–1.2)
Bilirubin, Direct: 0.15 mg/dL (ref 0.00–0.40)
Total Protein: 6.6 g/dL (ref 6.0–8.5)

## 2021-07-17 LAB — LIPID PANEL
Chol/HDL Ratio: 2.1 ratio (ref 0.0–4.4)
Cholesterol, Total: 137 mg/dL (ref 100–199)
HDL: 64 mg/dL (ref >39)
LDL Chol Calc (NIH): 59 mg/dL (ref 0–99)
Triglycerides: 68 mg/dL (ref 0–149)
VLDL Cholesterol Cal: 14 mg/dL (ref 5–40)

## 2021-07-20 ENCOUNTER — Other Ambulatory Visit (HOSPITAL_BASED_OUTPATIENT_CLINIC_OR_DEPARTMENT_OTHER): Payer: Self-pay

## 2021-07-20 DIAGNOSIS — Z87891 Personal history of nicotine dependence: Secondary | ICD-10-CM | POA: Diagnosis not present

## 2021-07-20 DIAGNOSIS — J322 Chronic ethmoidal sinusitis: Secondary | ICD-10-CM | POA: Diagnosis not present

## 2021-07-20 DIAGNOSIS — Z9089 Acquired absence of other organs: Secondary | ICD-10-CM | POA: Diagnosis not present

## 2021-07-20 MED ORDER — MUPIROCIN 2 % EX OINT
TOPICAL_OINTMENT | CUTANEOUS | 0 refills | Status: DC
  Filled 2021-07-20: qty 22, 7d supply, fill #0

## 2021-07-20 MED ORDER — SULFAMETHOXAZOLE-TRIMETHOPRIM 800-160 MG PO TABS
ORAL_TABLET | ORAL | 0 refills | Status: DC
  Filled 2021-07-20: qty 20, 10d supply, fill #0

## 2021-07-29 DIAGNOSIS — H53481 Generalized contraction of visual field, right eye: Secondary | ICD-10-CM | POA: Diagnosis not present

## 2021-07-31 ENCOUNTER — Telehealth: Payer: Self-pay | Admitting: Podiatry

## 2021-07-31 NOTE — Telephone Encounter (Signed)
Lvm for patient to call and schedule to pick up orthotics ?

## 2021-08-04 ENCOUNTER — Other Ambulatory Visit (HOSPITAL_BASED_OUTPATIENT_CLINIC_OR_DEPARTMENT_OTHER): Payer: Self-pay

## 2021-08-10 DIAGNOSIS — J341 Cyst and mucocele of nose and nasal sinus: Secondary | ICD-10-CM | POA: Diagnosis not present

## 2021-08-10 DIAGNOSIS — J342 Deviated nasal septum: Secondary | ICD-10-CM | POA: Diagnosis not present

## 2021-08-10 DIAGNOSIS — J0100 Acute maxillary sinusitis, unspecified: Secondary | ICD-10-CM | POA: Diagnosis not present

## 2021-08-10 DIAGNOSIS — J3089 Other allergic rhinitis: Secondary | ICD-10-CM | POA: Diagnosis not present

## 2021-08-10 DIAGNOSIS — J320 Chronic maxillary sinusitis: Secondary | ICD-10-CM | POA: Diagnosis not present

## 2021-08-10 DIAGNOSIS — J324 Chronic pansinusitis: Secondary | ICD-10-CM | POA: Diagnosis not present

## 2021-08-11 ENCOUNTER — Other Ambulatory Visit: Payer: Self-pay

## 2021-08-11 ENCOUNTER — Ambulatory Visit: Payer: Medicare Other

## 2021-08-11 DIAGNOSIS — M79672 Pain in left foot: Secondary | ICD-10-CM

## 2021-08-11 DIAGNOSIS — M7672 Peroneal tendinitis, left leg: Secondary | ICD-10-CM

## 2021-08-11 NOTE — Progress Notes (Signed)
SITUATION: ?Reason for Visit: Fitting and Delivery of Custom Fabricated Foot Orthoses ?Patient Report: Patient reports comfort and is satisfied with device. ? ?OBJECTIVE DATA: ?Patient History / Diagnosis:   ?  ICD-10-CM   ?1. Left foot pain  M79.672   ?  ?2. Peroneal tendinitis of left lower extremity  M76.72   ?  ? ? ?Provided Device:  Custom Functional Foot Orthotics ?    [employer]: [id number] ? ?GOAL OF ORTHOSIS ?- Improve gait ?- Decrease energy expenditure ?- Improve Balance ?- Provide Triplanar stability of foot complex ?- Facilitate motion ? ?ACTIONS PERFORMED ?Patient was fit with foot orthotics trimmed to shoe last. Patient tolerated fittign procedure.  ? ?Patient was provided with verbal and written instruction and demonstration regarding donning, doffing, wear, care, proper fit, function, purpose, cleaning, and use of the orthosis and in all related precautions and risks and benefits regarding the orthosis. ? ?Patient was also provided with verbal instruction regarding how to report any failures or malfunctions of the orthosis and necessary follow up care. Patient was also instructed to contact our office regarding any change in status that may affect the function of the orthosis. ? ?Patient demonstrated independence with proper donning, doffing, and fit and verbalized understanding of all instructions. ? ?PLAN: ?Patient is to follow up in one week or as necessary (PRN). All questions were answered and concerns addressed. Plan of care was discussed with and agreed upon by the patient. ? ?

## 2021-08-18 ENCOUNTER — Ambulatory Visit: Payer: Medicare Other

## 2021-08-18 DIAGNOSIS — M7672 Peroneal tendinitis, left leg: Secondary | ICD-10-CM

## 2021-08-18 NOTE — Progress Notes (Signed)
SITUATION ?Reason for Consult: Follow-up with custom foot orthotics ?Patient / Caregiver Report: Patient reports pain on left base of 5th ? ?OBJECTIVE DATA ?History / Diagnosis:  ?  ICD-10-CM   ?1. Peroneal tendinitis of left lower extremity  M76.72   ?  ? ? ?Change in Pathology: None ? ?ACTIONS PERFORMED ?Patient's equipment was checked for structural stability and fit. Heated and relieved base of 5th irritation spot. Device(s) intact and fit is excellent. All questions answered and concerns addressed. ? ?PLAN ?Follow-up as needed (PRN). Plan of care discussed with and agreed upon by patient / caregiver. ? ?

## 2021-08-24 ENCOUNTER — Other Ambulatory Visit: Payer: Self-pay | Admitting: Otolaryngology

## 2021-08-28 ENCOUNTER — Encounter (HOSPITAL_BASED_OUTPATIENT_CLINIC_OR_DEPARTMENT_OTHER): Payer: Self-pay

## 2021-08-28 ENCOUNTER — Other Ambulatory Visit (HOSPITAL_BASED_OUTPATIENT_CLINIC_OR_DEPARTMENT_OTHER): Payer: Self-pay

## 2021-09-16 ENCOUNTER — Other Ambulatory Visit (HOSPITAL_BASED_OUTPATIENT_CLINIC_OR_DEPARTMENT_OTHER): Payer: Self-pay

## 2021-09-16 ENCOUNTER — Other Ambulatory Visit: Payer: Self-pay

## 2021-09-16 ENCOUNTER — Telehealth: Payer: Self-pay

## 2021-09-16 DIAGNOSIS — J4520 Mild intermittent asthma, uncomplicated: Secondary | ICD-10-CM

## 2021-09-16 DIAGNOSIS — R0989 Other specified symptoms and signs involving the circulatory and respiratory systems: Secondary | ICD-10-CM

## 2021-09-16 MED ORDER — NEOMYCIN-POLYMYXIN-DEXAMETH 3.5-10000-0.1 OP OINT
TOPICAL_OINTMENT | OPHTHALMIC | 0 refills | Status: DC
  Filled 2021-09-16: qty 3.5, 3d supply, fill #0

## 2021-09-16 MED ORDER — FLUTICASONE-SALMETEROL 250-50 MCG/ACT IN AEPB: 1.0000 | INHALATION_SPRAY | Freq: Two times a day (BID) | RESPIRATORY_TRACT | Status: DC

## 2021-09-16 NOTE — Telephone Encounter (Signed)
Please refill x 3

## 2021-09-16 NOTE — Telephone Encounter (Signed)
Pharmacy is requesting a new prescription for patient  ?Fluticasone-Salmeterol (ADVAIR) 250-50 MCG/DOSE AEPB ? ?Last Ov 11/26/21 ?Filled 07/10/20 ?Please advise  ? ?

## 2021-09-17 ENCOUNTER — Other Ambulatory Visit (HOSPITAL_BASED_OUTPATIENT_CLINIC_OR_DEPARTMENT_OTHER): Payer: Self-pay

## 2021-09-17 MED ORDER — FLUTICASONE-SALMETEROL 250-50 MCG/ACT IN AEPB: 1.0000 | INHALATION_SPRAY | Freq: Two times a day (BID) | RESPIRATORY_TRACT | 3 refills | Status: DC

## 2021-09-17 NOTE — Addendum Note (Signed)
Addended by: Geradine Girt D on: 09/17/2021 08:10 AM ? ? Modules accepted: Orders ? ?

## 2021-09-17 NOTE — Telephone Encounter (Signed)
Rx sent to pharmacy   

## 2021-09-22 ENCOUNTER — Ambulatory Visit: Payer: Medicare Other

## 2021-09-22 DIAGNOSIS — M7672 Peroneal tendinitis, left leg: Secondary | ICD-10-CM

## 2021-09-22 NOTE — Progress Notes (Signed)
SITUATION ?Reason for Consult: Follow-up with foot orthotics ?Patient / Caregiver Report: Patient still has trouble with left foot orthosis ? ?OBJECTIVE DATA ?History / Diagnosis:  ?  ICD-10-CM   ?1. Peroneal tendinitis of left lower extremity  M76.72   ?  ? ? ?Change in Pathology: None ? ?ACTIONS PERFORMED ?Patient's equipment was checked for structural stability and fit. Ground and relieved left 5th styloid area. Device(s) intact and fit is excellent. All questions answered and concerns addressed. ? ?PLAN ?Follow-up as needed (PRN). Plan of care discussed with and agreed upon by patient / caregiver. ? ?

## 2021-09-23 ENCOUNTER — Ambulatory Visit: Admit: 2021-09-23 | Payer: Medicare Other | Admitting: Otolaryngology

## 2021-09-23 DIAGNOSIS — H05412 Enophthalmos due to atrophy of orbital tissue, left eye: Secondary | ICD-10-CM | POA: Diagnosis not present

## 2021-09-23 DIAGNOSIS — H04123 Dry eye syndrome of bilateral lacrimal glands: Secondary | ICD-10-CM | POA: Diagnosis not present

## 2021-09-23 DIAGNOSIS — J329 Chronic sinusitis, unspecified: Secondary | ICD-10-CM | POA: Diagnosis not present

## 2021-09-23 DIAGNOSIS — H0279 Other degenerative disorders of eyelid and periocular area: Secondary | ICD-10-CM | POA: Diagnosis not present

## 2021-09-23 DIAGNOSIS — H02831 Dermatochalasis of right upper eyelid: Secondary | ICD-10-CM | POA: Diagnosis not present

## 2021-09-23 DIAGNOSIS — H53481 Generalized contraction of visual field, right eye: Secondary | ICD-10-CM | POA: Diagnosis not present

## 2021-09-23 DIAGNOSIS — H02421 Myogenic ptosis of right eyelid: Secondary | ICD-10-CM | POA: Diagnosis not present

## 2021-09-23 SURGERY — SURGERY, PARANASAL SINUS, ENDOSCOPIC, WITH NASAL SEPTOPLASTY, TURBINOPLASTY, AND MAXILLARY SINUSOTOMY
Anesthesia: General | Laterality: Right

## 2021-10-05 ENCOUNTER — Other Ambulatory Visit (HOSPITAL_BASED_OUTPATIENT_CLINIC_OR_DEPARTMENT_OTHER): Payer: Self-pay

## 2021-10-06 ENCOUNTER — Ambulatory Visit: Payer: Medicare Other

## 2021-10-06 ENCOUNTER — Other Ambulatory Visit (HOSPITAL_BASED_OUTPATIENT_CLINIC_OR_DEPARTMENT_OTHER): Payer: Self-pay

## 2021-10-06 DIAGNOSIS — M7672 Peroneal tendinitis, left leg: Secondary | ICD-10-CM

## 2021-10-06 NOTE — Progress Notes (Signed)
SITUATION Reason for Consult: Follow-up with foot orthotics Patient / Caregiver Report: Left styloid is still painful  OBJECTIVE DATA History / Diagnosis:    ICD-10-CM   1. Peroneal tendinitis of left lower extremity  M76.72       Change in Pathology: None  ACTIONS PERFORMED Patient's equipment was checked for structural stability and fit. Heated and relieved and ground left styloid. Device(s) intact and fit is excellent. All questions answered and concerns addressed.  PLAN Follow-up as needed (PRN). Plan of care discussed with and agreed upon by patient / caregiver.

## 2021-10-08 ENCOUNTER — Other Ambulatory Visit: Payer: Self-pay | Admitting: Internal Medicine

## 2021-10-08 ENCOUNTER — Encounter (INDEPENDENT_AMBULATORY_CARE_PROVIDER_SITE_OTHER): Payer: Self-pay

## 2021-10-08 DIAGNOSIS — Z1231 Encounter for screening mammogram for malignant neoplasm of breast: Secondary | ICD-10-CM

## 2021-10-12 ENCOUNTER — Other Ambulatory Visit: Payer: Self-pay | Admitting: Internal Medicine

## 2021-10-16 ENCOUNTER — Other Ambulatory Visit: Payer: Self-pay

## 2021-10-16 ENCOUNTER — Encounter: Payer: Self-pay | Admitting: Internal Medicine

## 2021-10-16 MED ORDER — FLUTICASONE-SALMETEROL 250-50 MCG/ACT IN AEPB: 1.0000 | INHALATION_SPRAY | Freq: Two times a day (BID) | RESPIRATORY_TRACT | 3 refills | Status: DC

## 2021-10-20 ENCOUNTER — Ambulatory Visit: Payer: Medicare Other

## 2021-10-20 ENCOUNTER — Other Ambulatory Visit: Payer: Self-pay | Admitting: Internal Medicine

## 2021-10-20 ENCOUNTER — Encounter: Payer: Self-pay | Admitting: Internal Medicine

## 2021-10-20 ENCOUNTER — Ambulatory Visit
Admission: RE | Admit: 2021-10-20 | Discharge: 2021-10-20 | Disposition: A | Discharge status: Home / Self Care | Payer: Medicare Other | Source: Ambulatory Visit | Attending: Internal Medicine | Admitting: Internal Medicine

## 2021-10-20 DIAGNOSIS — Z1231 Encounter for screening mammogram for malignant neoplasm of breast: Secondary | ICD-10-CM | POA: Diagnosis not present

## 2021-10-20 MED ORDER — MONTELUKAST SODIUM 10 MG PO TABS: 10.0000 mg | ORAL_TABLET | Freq: Every day | ORAL | 0 refills | Status: DC

## 2021-10-20 MED ORDER — CARVEDILOL 6.25 MG PO TABS: ORAL_TABLET | ORAL | 0 refills | Status: DC

## 2021-10-27 ENCOUNTER — Other Ambulatory Visit (HOSPITAL_BASED_OUTPATIENT_CLINIC_OR_DEPARTMENT_OTHER): Payer: Self-pay

## 2021-11-02 ENCOUNTER — Other Ambulatory Visit (HOSPITAL_BASED_OUTPATIENT_CLINIC_OR_DEPARTMENT_OTHER): Payer: Self-pay

## 2021-11-03 ENCOUNTER — Other Ambulatory Visit (HOSPITAL_BASED_OUTPATIENT_CLINIC_OR_DEPARTMENT_OTHER): Payer: Self-pay

## 2021-11-09 ENCOUNTER — Other Ambulatory Visit: Payer: Self-pay | Admitting: Internal Medicine

## 2021-11-30 ENCOUNTER — Encounter (HOSPITAL_BASED_OUTPATIENT_CLINIC_OR_DEPARTMENT_OTHER): Payer: Self-pay

## 2021-11-30 ENCOUNTER — Other Ambulatory Visit (HOSPITAL_BASED_OUTPATIENT_CLINIC_OR_DEPARTMENT_OTHER): Payer: Self-pay

## 2021-12-02 ENCOUNTER — Telehealth: Payer: Self-pay | Admitting: Cardiology

## 2021-12-02 ENCOUNTER — Other Ambulatory Visit: Payer: Self-pay

## 2021-12-02 ENCOUNTER — Other Ambulatory Visit (HOSPITAL_BASED_OUTPATIENT_CLINIC_OR_DEPARTMENT_OTHER): Payer: Self-pay

## 2021-12-02 DIAGNOSIS — E785 Hyperlipidemia, unspecified: Secondary | ICD-10-CM

## 2021-12-02 DIAGNOSIS — R931 Abnormal findings on diagnostic imaging of heart and coronary circulation: Secondary | ICD-10-CM

## 2021-12-02 MED ORDER — REPATHA SURECLICK 140 MG/ML ~~LOC~~ SOAJ: 140.0000 mg | SUBCUTANEOUS | 11 refills | Status: DC

## 2021-12-02 NOTE — Telephone Encounter (Signed)
  Pt c/o medication issue:  1. Name of Medication: Evolocumab (REPATHA SURECLICK) 779 MG/ML SOAJ  2. How are you currently taking this medication (dosage and times per day)? Inject 140 mg into the skin every 14 (fourteen) days.  3. Are you having a reaction (difficulty breathing--STAT)?   4. What is your medication issue? Pt said, she needs to get prior auth for this medications. She said, she needs it done as soon as possible since her next dose due this Friday 7/21

## 2021-12-03 NOTE — Telephone Encounter (Signed)
PA for Repatha submitted.  Key [id number]

## 2021-12-04 ENCOUNTER — Other Ambulatory Visit (HOSPITAL_BASED_OUTPATIENT_CLINIC_OR_DEPARTMENT_OTHER): Payer: Self-pay

## 2021-12-07 ENCOUNTER — Other Ambulatory Visit (HOSPITAL_BASED_OUTPATIENT_CLINIC_OR_DEPARTMENT_OTHER): Payer: Self-pay

## 2021-12-08 ENCOUNTER — Other Ambulatory Visit (HOSPITAL_BASED_OUTPATIENT_CLINIC_OR_DEPARTMENT_OTHER): Payer: Self-pay

## 2021-12-08 DIAGNOSIS — R931 Abnormal findings on diagnostic imaging of heart and coronary circulation: Secondary | ICD-10-CM | POA: Insufficient documentation

## 2021-12-08 MED ORDER — REPATHA SURECLICK 140 MG/ML ~~LOC~~ SOAJ
1.0000 mL | SUBCUTANEOUS | 3 refills | Status: DC
  Filled 2021-12-08: qty 6, fill #0
  Filled 2022-03-04: qty 6, 84d supply, fill #0
  Filled 2022-05-24: qty 6, 84d supply, fill #1
  Filled 2022-07-16 – 2022-08-03 (×2): qty 6, 84d supply, fill #2
  Filled 2022-11-29: qty 6, 84d supply, fill #3

## 2021-12-08 NOTE — Telephone Encounter (Signed)
PA for Repatha approved. No end date given. 

## 2021-12-08 NOTE — Telephone Encounter (Signed)
Called patient and she is aware

## 2021-12-08 NOTE — Addendum Note (Signed)
Addended by: Rollen Sox on: 12/08/2021 11:55 AM   Modules accepted: Orders

## 2021-12-15 ENCOUNTER — Other Ambulatory Visit (HOSPITAL_BASED_OUTPATIENT_CLINIC_OR_DEPARTMENT_OTHER): Payer: Self-pay

## 2021-12-24 DIAGNOSIS — H401232 Low-tension glaucoma, bilateral, moderate stage: Secondary | ICD-10-CM | POA: Diagnosis not present

## 2021-12-28 ENCOUNTER — Ambulatory Visit (INDEPENDENT_AMBULATORY_CARE_PROVIDER_SITE_OTHER): Payer: Medicare Other | Admitting: Internal Medicine

## 2021-12-28 ENCOUNTER — Encounter: Payer: Self-pay | Admitting: Internal Medicine

## 2021-12-28 VITALS — BP 110/80 | HR 47 | Temp 98.6°F | Ht 62.75 in | Wt 170.2 lb

## 2021-12-28 DIAGNOSIS — J4520 Mild intermittent asthma, uncomplicated: Secondary | ICD-10-CM

## 2021-12-28 DIAGNOSIS — Z6830 Body mass index (BMI) 30.0-30.9, adult: Secondary | ICD-10-CM

## 2021-12-28 DIAGNOSIS — R931 Abnormal findings on diagnostic imaging of heart and coronary circulation: Secondary | ICD-10-CM

## 2021-12-28 DIAGNOSIS — E039 Hypothyroidism, unspecified: Secondary | ICD-10-CM | POA: Diagnosis not present

## 2021-12-28 DIAGNOSIS — E785 Hyperlipidemia, unspecified: Secondary | ICD-10-CM | POA: Diagnosis not present

## 2021-12-28 DIAGNOSIS — Z79899 Other long term (current) drug therapy: Secondary | ICD-10-CM | POA: Diagnosis not present

## 2021-12-28 DIAGNOSIS — R7301 Impaired fasting glucose: Secondary | ICD-10-CM

## 2021-12-28 MED ORDER — CARVEDILOL 6.25 MG PO TABS
3.1250 mg | ORAL_TABLET | Freq: Two times a day (BID) | ORAL | 3 refills | Status: DC
Start: 2021-12-28 — End: 2022-02-26

## 2021-12-28 NOTE — Progress Notes (Signed)
Chief Complaint  Patient presents with   Annual Exam    HPI: Patient  Marie Kelly  76 y.o. comes in today for yearly visit  Bp :Micardis and carvedilol tolerating well has been noted to have a low pulse in the high 40s.  No syncope exercise limitations with this.  Singlulair : Helping upper respiratory allergies Advair  doing ok on prn  taking  bid   qo d when she gets occasional shortness of breath To have sinus surgery  this month  Dr Kaylyn Lim August 23   CV :Also on repatha per cards .  I coronary calcium score strong family history.  Synthroid :  dr Kelton Pillar   recent increase , in dosing 75 mcg daily except for Sunday where she takes 150.     Ophthalmology : update  .  Specialist retina .   Is doing tops  needs goal weight Health Maintenance  Topic Date Due   TETANUS/TDAP  09/08/2021   COVID-19 Vaccine (4 - Pfizer risk series) 04/21/2022 (Originally 09/09/2020)   INFLUENZA VACCINE  08/16/2022 (Originally 12/15/2021)   Pneumonia Vaccine 75+ Years old  Completed   DEXA SCAN  Completed   Hepatitis C Screening  Completed   Zoster Vaccines- Shingrix  Completed   HPV VACCINES  Aged Out   COLONOSCOPY (Pts 45-19yr Insurance coverage will need to be confirmed)  Discontinued   Health Maintenance Review LIFESTYLE:  Exercise:  walk and gym Tobacco/ETS:n Alcohol: ocass Sugar beverages: Sleep: 7-8  Drug use: no HH of 2  no pets   [place] next [place]  REST of 12 system review negative except as per HPI   Past Medical History:  Diagnosis Date   Anemia    pt. denies   Arthritis    Asthma    Closed disp fx of right olecranon with intraartic exten w nonunion 04/19/2014   Colon polyps    Complication of anesthesia    2 days after surgery-muscles hurtand spasmed-needed a muscle relaxant    Diverticulosis    Fracture of right olecranon process 10/26/2013   GERD (gastroesophageal reflux disease)    had endo?  taking nexium 3 x per week   Hyperlipidemia    had been on lipitor changes to pravachol cause of cost and tricor has since stopped  NO  se reported    Hypertension    Hypothyroidism     on meds for years   Impaired fasting glucose    Olecranon fracture 10/25/2013   right arm   PONV (postoperative nausea and vomiting)    Ventricular bigeminy seen on cardiac monitor    Cardiac work up Dr CBerlinda LastHeart   Wears dentures    top-partial bottom   Wears glasses     Past Surgical History:  Procedure Laterality Date   BELPHAROPTOSIS REPAIR Bilateral    CATARACT EXTRACTION Bilateral 11/2016   CHOLECYSTECTOMY     COLONOSCOPY     HARVEST BONE GRAFT Right 04/19/2014   Procedure: HARVEST LEFT TIBIA BONE GRAFT ;  Surgeon: JJohnny Bridge MD;  [facility]  Service: Orthopedics;  Laterality: Right;   HEMORROIDECTOMY     KNEE ARTHROSCOPY Right    NASAL SINUS SURGERY     ORIF ELBOW FRACTURE Right 10/26/2013   Procedure: RIGHT ELBOW FRACTUE OPEN TREATMENT ULNAR PROXIMAL END OLECRANON PROCESS INCLUDES INTERNAL FIXATION;  Surgeon: JJohnny Bridge MD;  Location: [facility];  Service: Orthopedics;  Laterality: Right;   ORIF ELBOW FRACTURE Right 04/19/2014   Procedure: OPEN REDUCTION INTERNAL FIXATION (ORIF) RIGHT ELBOW/OLECRANON NONUNION FRACTURE WITH HARDWARE REMOVAL;  Surgeon: Johnny Bridge, MD;  Location: [facility];  Service: Orthopedics;  Laterality: Right;   TONSILLECTOMY     TUBAL LIGATION     uterus repair      Family History  Problem Relation Age of Onset   Stroke Mother    Diabetes Father    Heart disease Father    Alzheimer's disease Other    Stomach cancer Maternal Aunt    Colon cancer Neg Hx    Pancreatic cancer Neg Hx     Social History   Socioeconomic History   Marital status: Married    Spouse name: Not on file   Number of children: 3   Years of education: Not on file   Highest education level: Not on file   Occupational History   Not on file  Tobacco Use   Smoking status: Former    Types: Cigarettes    Quit date: 10/25/1973    Years since quitting: 48.2   Smokeless tobacco: Never  Vaping Use   Vaping Use: Never used  Substance and Sexual Activity   Alcohol use: Yes    Alcohol/week: 0.0 standard drinks of alcohol    Comment: socially   Drug use: No   Sexual activity: Yes  Other Topics Concern   Not on file  Social History Narrative   hh of 2    Married     Husband is Still in [place]  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    Gravida 3 para 3   Last td 6 2008   HS educated       REmote hx of tobacco 1.5 ppd for 6 years as a teen Q 39   Social Determinants of Radio broadcast assistant Strain: Not on file  Food Insecurity: Not on file  Transportation Needs: Not on file  Physical Activity: Not on file  Stress: Not on file  Social Connections: Not on file    Outpatient Medications Prior to Visit  Medication Sig Dispense Refill   B Complex Vitamins (VITAMIN B COMPLEX) TABS Take 1 tablet by mouth daily.     betamethasone dipropionate 0.05 % cream betamethasone dipropionate 0.05 % topical cream  APPLY TO AFFECTED AREA TWICE A DAY 2 WEEKS ON 1 WEEK OFF AS NEEDED FLARE/ITCH     cholecalciferol (VITAMIN D3) 25 MCG (1000 UNIT) tablet Take 1,000 Units by mouth daily.     Evolocumab (REPATHA SURECLICK) 017 MG/ML SOAJ Inject 140 mg into the skin every 14 (fourteen) days. 2 mL 11   Evolocumab (REPATHA SURECLICK) 793 MG/ML SOAJ Inject 1 mL into the skin every 14 (fourteen) days. 6 mL 3   fluticasone-salmeterol (ADVAIR) 250-50 MCG/ACT AEPB Inhale 1 puff into the lungs in the morning and at bedtime. 180 each 3   Fluticasone-Salmeterol (ADVAIR) 250-50 MCG/DOSE AEPB USE 1 INHALATION TWICE A DAY 180 each 2   latanoprost (XALATAN) 0.005 % ophthalmic solution Instill 1 drop into each eye each everning 2.5 mL 1    levothyroxine (SYNTHROID) 75 MCG tablet TAKE 1 TABLET MONDAY THROUGH SATURDAY AND TAKE 2 TABLETS ON SUNDAYS AS DIRECTED 104 tablet 1   montelukast (SINGULAIR) 10 MG tablet Take 1 tablet (  10 mg total) by mouth at bedtime. SCHEDULE AN APPT FOR FUTURE REFILLS 90 tablet 0   mupirocin ointment (BACTROBAN) 2 % Add 1 inch strip to nasal saline rinse and perform rinse with ointment twice daily 22 g 0   telmisartan (MICARDIS) 80 MG tablet TAKE 1 TABLET DAILY 90 tablet 3   timolol (TIMOPTIC) 0.5 % ophthalmic solution Place into both eyes. Twice a day     valACYclovir (VALTREX) 1000 MG tablet      carvedilol (COREG) 6.25 MG tablet TAKE 1 TABLET IN THE MORNING AND 2 TABLETS IN THE EVENING or as directed 270 tablet 0   fluticasone (CUTIVATE) 0.05 % cream fluticasone propionate 0.05 % topical cream  APPLY A SMALL AMOUNT TO AFFECTED AREA TWICE A DAY AS NEEDED (Patient not taking: Reported on 12/28/2021)     levofloxacin (LEVAQUIN) 750 MG tablet Take 1 tablet (750 mg total) by mouth daily for 10 days. (Patient not taking: Reported on 12/28/2021) 10 tablet 0   meloxicam (MOBIC) 15 MG tablet Take 1 tablet (15 mg total) by mouth daily. (Patient not taking: Reported on 12/28/2021) 30 tablet 1   neomycin-polymyxin b-dexamethasone (MAXITROL) 3.5-10000-0.1 OINT APPLY A SMALL AMOUNT ONTO SUTURES OR OPERATIVE SITE TWICE A DAY FOR THREE DAYS THEN STOP (Patient not taking: Reported on 12/28/2021) 3.5 g 0   predniSONE (DELTASONE) 10 MG tablet Take 4 tablets (40 mg total) by mouth daily for 3 days, THEN 2 tablets (20 mg total) daily for 2 days, THEN 1 tablet (10 mg total) daily for 2 days. (Patient not taking: Reported on 12/28/2021) 18 tablet 0   sulfamethoxazole-trimethoprim (BACTRIM DS) 800-160 MG tablet Take 1 tablet by mouth 2 times daily for 10 days. (Patient not taking: Reported on 12/28/2021) 20 tablet 0   No facility-administered medications prior to visit.     EXAM:  BP 110/80 (BP Location: Left Arm, Patient Position:  Sitting, Cuff Size: Normal)   Pulse (!) 47   Temp 98.6 F (37 C) (Oral)   Ht 5' 2.75" (1.594 m)   Wt 170 lb 3.2 oz (77.2 kg)   SpO2 98%   BMI 30.39 kg/m   Body mass index is 30.39 kg/m. Wt Readings from Last 3 Encounters:  12/28/21 170 lb 3.2 oz (77.2 kg)  03/09/21 169 lb (76.7 kg)  03/03/21 169 lb 9.6 oz (76.9 kg)    Physical Exam: Vital signs reviewed IRC:VELF is a well-developed well-nourished alert cooperative    who appearsr stated age in no acute distress.  HEENT: normocephalic atraumatic , Eyes: PERRL EOM's full, conjunctiva clear, Nares: paten,t no deformity discharge or tenderness., Ears: no deformity EAC's clear TMs with normal landmarks. Mouth: clear OP, no lesions, edema.  Moist mucous membranes. Dentition in adequate repair. NECK: supple without masses, thyromegaly or bruits. CHEST/PULM:  Clear to auscultation and percussion breath sounds equal no wheeze , rales or rhonchi.  CV: PMI is nondisplaced, S1 S2 no gallops, murmurs, rubs. Peripheral pulses are full without delay.No JVD .  Pulse is 50 and 46  rr  ABDOMEN: Bowel sounds normal nontender  No guard or rebound, no hepato splenomegal no CVA tenderness.  Extremtities:  No clubbing cyanosis or edema, no acute joint swelling or redness no focal atrophy NEURO:  Oriented x3, cranial nerves 3-12 appear to be intact, no obvious focal weakness,gait within normal limits no abnormal reflexes or asymmetrical SKIN: No acute rashes normal turgor, color, no bruising or petechiae. PSYCH: Oriented, good eye contact, no obvious depression anxiety, cognition and  judgment appear normal. LN: no cervical axillary adenopathy  Lab Results  Component Value Date   WBC 4.6 09/17/2020   HGB 12.6 09/17/2020   HCT 37.3 09/17/2020   PLT 232.0 09/17/2020   GLUCOSE 102 (H) 06/02/2021   CHOL 137 07/16/2021   TRIG 68 07/16/2021   HDL 64 07/16/2021   LDLDIRECT 168.5 04/23/2013   LDLCALC 59 07/16/2021   ALT 36 (H) 07/16/2021   AST 19  07/16/2021   NA 138 06/02/2021   K 4.9 06/02/2021   CL 99 06/02/2021   CREATININE 0.74 06/02/2021   BUN 16 06/02/2021   CO2 25 06/02/2021   TSH 4.810 (H) 06/02/2021   HGBA1C 5.4 09/17/2020    BP Readings from Last 3 Encounters:  12/28/21 110/80  05/05/21 122/78  03/09/21 122/74    Lab plan  reviewed with patient   ASSESSMENT AND PLAN:  Discussed the following assessment and plan:    ICD-10-CM   1. Medication management  Z60.109 Basic metabolic panel    Hepatic function panel    CBC with Differential/Platelet    TSH    Hemoglobin N2T    Basic metabolic panel    Hepatic function panel    CBC with Differential/Platelet    TSH    Hemoglobin A1c    2. Intermittent asthma without complication, unspecified asthma severity  F57.32 Basic metabolic panel    Hepatic function panel    CBC with Differential/Platelet    TSH    Hemoglobin K0U    Basic metabolic panel    Hepatic function panel    CBC with Differential/Platelet    TSH    Hemoglobin A1c    3. Hyperlipidemia, unspecified hyperlipidemia type  R42.7 Basic metabolic panel    Hepatic function panel    CBC with Differential/Platelet    TSH    Hemoglobin C6C    Basic metabolic panel    Hepatic function panel    CBC with Differential/Platelet    TSH    Hemoglobin A1c    4. Hypothyroidism, unspecified type  B76.2 Basic metabolic panel    Hepatic function panel    CBC with Differential/Platelet    TSH    Hemoglobin G3T    Basic metabolic panel    Hepatic function panel    CBC with Differential/Platelet    TSH    Hemoglobin A1c    5. Impaired fasting glucose  D17.61 Basic metabolic panel    Hepatic function panel    CBC with Differential/Platelet    TSH    Hemoglobin Y0V    Basic metabolic panel    Hepatic function panel    CBC with Differential/Platelet    TSH    Hemoglobin A1c    6. High coronary artery calcium score  P71.0 Basic metabolic panel    Hepatic function panel    CBC with  Differential/Platelet    TSH    Hemoglobin G2I    Basic metabolic panel    Hepatic function panel    CBC with Differential/Platelet    TSH    Hemoglobin A1c   onrepatha     7. BMI 30.0-30.9,adult  Z68.30    get bmi to 27       Get tdap at pharmacy . Dec carvedilol to 3.125 bid and send in readings  pulse  To have sinus surgery  in future  Gets advair in [place] and last sent to  mail away please ask for refills again and specific at that time  Return in about 1 year (around 12/29/2022) for depending on results.  Patient Care Team: Bane Hagy, Standley Brooking, MD as PCP - General (Internal Medicine) Shon Hough, MD (Ophthalmology) Marchia Bond, MD as Consulting Physician (Orthopedic Surgery) Patient Instructions  Good to see you today  Because of low pulse   decrease carvedilol to 1/2 tab( 3.125 mg) twice a day.  Checking lab today and wll lshare with  endo  BMI goal 27 or lower # 150  Suggest get tdap at your pharmacy before travel as you are due.   Send in bp and pulse readings   1-2 weeks after decrease carvedilol.   Standley Brooking. Marlana Mckowen M.D.

## 2021-12-28 NOTE — Patient Instructions (Addendum)
Good to see you today  Because of low pulse   decrease carvedilol to 1/2 tab( 3.125 mg) twice a day.  Checking lab today and wll lshare with  endo  BMI goal 27 or lower # 150  Suggest get tdap at your pharmacy before travel as you are due.   Send in bp and pulse readings   1-2 weeks after decrease carvedilol.

## 2021-12-29 LAB — CBC WITH DIFFERENTIAL/PLATELET
Basophils Absolute: 0.1 10*3/uL (ref 0.0–0.1)
Basophils Relative: 1.5 % (ref 0.0–3.0)
Eosinophils Absolute: 0.4 10*3/uL (ref 0.0–0.7)
Eosinophils Relative: 7.9 % — ABNORMAL HIGH (ref 0.0–5.0)
HCT: 37.5 % (ref 36.0–46.0)
Hemoglobin: 12.4 g/dL (ref 12.0–15.0)
Lymphocytes Relative: 35.1 % (ref 12.0–46.0)
Lymphs Abs: 1.8 10*3/uL (ref 0.7–4.0)
MCHC: 33.2 g/dL (ref 30.0–36.0)
MCV: 89 fl (ref 78.0–100.0)
Monocytes Absolute: 0.6 10*3/uL (ref 0.1–1.0)
Monocytes Relative: 12.1 % — ABNORMAL HIGH (ref 3.0–12.0)
Neutro Abs: 2.3 10*3/uL (ref 1.4–7.7)
Neutrophils Relative %: 43.4 % (ref 43.0–77.0)
Platelets: 249 10*3/uL (ref 150.0–400.0)
RBC: 4.22 Mil/uL (ref 3.87–5.11)
RDW: 15.1 % (ref 11.5–15.5)
WBC: 5.3 10*3/uL (ref 4.0–10.5)

## 2021-12-29 LAB — HEPATIC FUNCTION PANEL
ALT: 21 U/L (ref 0–35)
AST: 20 U/L (ref 0–37)
Albumin: 4.6 g/dL (ref 3.5–5.2)
Alkaline Phosphatase: 41 U/L (ref 39–117)
Bilirubin, Direct: 0.2 mg/dL (ref 0.0–0.3)
Total Bilirubin: 0.8 mg/dL (ref 0.2–1.2)
Total Protein: 7.4 g/dL (ref 6.0–8.3)

## 2021-12-29 LAB — BASIC METABOLIC PANEL
BUN: 15 mg/dL (ref 6–23)
CO2: 26 mEq/L (ref 19–32)
Calcium: 9.9 mg/dL (ref 8.4–10.5)
Chloride: 101 mEq/L (ref 96–112)
Creatinine, Ser: 0.77 mg/dL (ref 0.40–1.20)
GFR: 74.79 mL/min (ref >60.00)
Glucose, Bld: 88 mg/dL (ref 70–99)
Potassium: 4.6 mEq/L (ref 3.5–5.1)
Sodium: 135 mEq/L (ref 135–145)

## 2021-12-29 LAB — TSH: TSH: 1.18 u[IU]/mL (ref 0.35–5.50)

## 2021-12-29 LAB — HEMOGLOBIN A1C: Hgb A1c MFr Bld: 5.6 % (ref 4.6–6.5)

## 2022-01-01 NOTE — Progress Notes (Signed)
Blood results are normal or out of range  levels clinically insignificant .   Will forward to Dr. Kelton Pillar

## 2022-01-05 ENCOUNTER — Encounter (HOSPITAL_COMMUNITY): Payer: Self-pay | Admitting: Otolaryngology

## 2022-01-05 ENCOUNTER — Other Ambulatory Visit: Payer: Self-pay

## 2022-01-05 NOTE — Progress Notes (Signed)
PCP - Dr Shanon Ace Cardiologist - Dr Kirk Ruths  Chest x-ray - 07/23/19 EKG - 03/03/21 Stress Test - n/a ECHO - 06/22/13 Cardiac Cath - n/a  ICD Pacemaker/Loop - n/a  Sleep Study -  n/a CPAP - none  Anesthesia review: Yes  STOP now taking any Aspirin (unless otherwise instructed by your surgeon), Aleve, Naproxen, Ibuprofen, Motrin, Advil, Goody's, BC's, all herbal medications, fish oil, and all vitamins.   Coronavirus Screening Do you have any of the following symptoms:  Cough yes/no: No Fever (>100.9F)  yes/no: No Runny nose yes/no: No Sore throat yes/no: No Difficulty breathing/shortness of breath  yes/no: No  Have you traveled in the last 14 days and where? yes/no: No  Patient verbalized understanding of instructions that were given via phone.

## 2022-01-06 ENCOUNTER — Encounter (HOSPITAL_COMMUNITY): Payer: Self-pay | Admitting: Otolaryngology

## 2022-01-06 ENCOUNTER — Other Ambulatory Visit: Payer: Self-pay

## 2022-01-06 ENCOUNTER — Ambulatory Visit (HOSPITAL_BASED_OUTPATIENT_CLINIC_OR_DEPARTMENT_OTHER): Payer: Medicare Other | Admitting: Physician Assistant

## 2022-01-06 ENCOUNTER — Encounter (HOSPITAL_COMMUNITY)
Admission: RE | Disposition: A | Discharge status: Home / Self Care | Payer: Self-pay | Source: Home / Self Care | Attending: Otolaryngology

## 2022-01-06 ENCOUNTER — Other Ambulatory Visit (HOSPITAL_COMMUNITY): Payer: Self-pay

## 2022-01-06 ENCOUNTER — Ambulatory Visit (HOSPITAL_COMMUNITY)
Admission: RE | Admit: 2022-01-06 | Discharge: 2022-01-06 | Disposition: A | Discharge status: Home / Self Care | Payer: Medicare Other | Attending: Otolaryngology | Admitting: Otolaryngology

## 2022-01-06 ENCOUNTER — Ambulatory Visit (HOSPITAL_COMMUNITY): Payer: Medicare Other | Admitting: Physician Assistant

## 2022-01-06 DIAGNOSIS — E039 Hypothyroidism, unspecified: Secondary | ICD-10-CM | POA: Insufficient documentation

## 2022-01-06 DIAGNOSIS — K219 Gastro-esophageal reflux disease without esophagitis: Secondary | ICD-10-CM | POA: Diagnosis not present

## 2022-01-06 DIAGNOSIS — J329 Chronic sinusitis, unspecified: Secondary | ICD-10-CM | POA: Diagnosis not present

## 2022-01-06 DIAGNOSIS — J324 Chronic pansinusitis: Secondary | ICD-10-CM

## 2022-01-06 DIAGNOSIS — J320 Chronic maxillary sinusitis: Secondary | ICD-10-CM | POA: Diagnosis not present

## 2022-01-06 DIAGNOSIS — I10 Essential (primary) hypertension: Secondary | ICD-10-CM | POA: Diagnosis not present

## 2022-01-06 DIAGNOSIS — M1990 Unspecified osteoarthritis, unspecified site: Secondary | ICD-10-CM | POA: Diagnosis not present

## 2022-01-06 DIAGNOSIS — Z87891 Personal history of nicotine dependence: Secondary | ICD-10-CM | POA: Diagnosis not present

## 2022-01-06 DIAGNOSIS — D638 Anemia in other chronic diseases classified elsewhere: Secondary | ICD-10-CM | POA: Diagnosis not present

## 2022-01-06 HISTORY — PX: BALLOON SINUPLASTY: SHX5740

## 2022-01-06 HISTORY — PX: SINUS ENDO WITH FUSION: SHX5329

## 2022-01-06 HISTORY — PX: MAXILLARY ANTROSTOMY: SHX2003

## 2022-01-06 HISTORY — PX: ETHMOIDECTOMY: SHX5197

## 2022-01-06 HISTORY — DX: Cardiac arrhythmia, unspecified: I49.9

## 2022-01-06 HISTORY — DX: Personal history of other infectious and parasitic diseases: Z86.19

## 2022-01-06 HISTORY — PX: FRONTAL SINUS EXPLORATION: SHX6591

## 2022-01-06 SURGERY — SURGERY, PARANASAL SINUS, ENDOSCOPIC, WITH NASAL SEPTOPLASTY, TURBINOPLASTY, AND MAXILLARY SINUSOTOMY
Anesthesia: General | Site: Nose | Laterality: Right

## 2022-01-06 MED ORDER — FENTANYL CITRATE (PF) 250 MCG/5ML IJ SOLN
INTRAMUSCULAR | Status: AC
  Filled 2022-01-06: qty 5

## 2022-01-06 MED ORDER — ACETAMINOPHEN 325 MG PO TABS: 325.0000 mg | ORAL_TABLET | ORAL | Status: DC | PRN

## 2022-01-06 MED ORDER — OXYCODONE HCL 5 MG PO TABS: 5.0000 mg | ORAL_TABLET | Freq: Once | ORAL | Status: DC | PRN

## 2022-01-06 MED ORDER — FENTANYL CITRATE (PF) 250 MCG/5ML IJ SOLN
INTRAMUSCULAR | Status: DC | PRN
  Administered 2022-01-06: 100 ug via INTRAVENOUS

## 2022-01-06 MED ORDER — ROCURONIUM BROMIDE 10 MG/ML (PF) SYRINGE
PREFILLED_SYRINGE | INTRAVENOUS | Status: DC | PRN
  Administered 2022-01-06: 50 mg via INTRAVENOUS

## 2022-01-06 MED ORDER — PHENYLEPHRINE 80 MCG/ML (10ML) SYRINGE FOR IV PUSH (FOR BLOOD PRESSURE SUPPORT)
PREFILLED_SYRINGE | INTRAVENOUS | Status: DC | PRN
  Administered 2022-01-06: 160 ug via INTRAVENOUS

## 2022-01-06 MED ORDER — PROPOFOL 1000 MG/100ML IV EMUL
INTRAVENOUS | Status: AC
Start: 2022-01-06 — End: ?
  Filled 2022-01-06: qty 100

## 2022-01-06 MED ORDER — LIDOCAINE-EPINEPHRINE 1 %-1:100000 IJ SOLN
INTRAMUSCULAR | Status: AC
Start: 2022-01-06 — End: ?
  Filled 2022-01-06: qty 1

## 2022-01-06 MED ORDER — 0.9 % SODIUM CHLORIDE (POUR BTL) OPTIME
TOPICAL | Status: DC | PRN
  Administered 2022-01-06: 1000 mL

## 2022-01-06 MED ORDER — PREDNISONE 10 MG PO TABS
ORAL_TABLET | ORAL | 0 refills | Status: AC
  Filled 2022-01-06: qty 30, 12d supply, fill #0

## 2022-01-06 MED ORDER — CHLORHEXIDINE GLUCONATE 0.12 % MT SOLN: 15.0000 mL | Freq: Once | OROMUCOSAL | Status: AC

## 2022-01-06 MED ORDER — FENTANYL CITRATE (PF) 100 MCG/2ML IJ SOLN: 25.0000 ug | INTRAMUSCULAR | Status: DC | PRN

## 2022-01-06 MED ORDER — LIDOCAINE 2% (20 MG/ML) 5 ML SYRINGE
INTRAMUSCULAR | Status: DC | PRN
  Administered 2022-01-06: 60 mg via INTRAVENOUS

## 2022-01-06 MED ORDER — PHENYLEPHRINE HCL-NACL 20-0.9 MG/250ML-% IV SOLN
INTRAVENOUS | Status: AC
  Filled 2022-01-06: qty 500

## 2022-01-06 MED ORDER — OXYMETAZOLINE HCL 0.05 % NA SOLN
NASAL | Status: DC | PRN
  Administered 2022-01-06: 1

## 2022-01-06 MED ORDER — DEXAMETHASONE SODIUM PHOSPHATE 10 MG/ML IJ SOLN
INTRAMUSCULAR | Status: DC | PRN
  Administered 2022-01-06: 10 mg via INTRAVENOUS

## 2022-01-06 MED ORDER — MEPERIDINE HCL 25 MG/ML IJ SOLN: 6.2500 mg | INTRAMUSCULAR | Status: DC | PRN

## 2022-01-06 MED ORDER — OXYMETAZOLINE HCL 0.05 % NA SOLN
NASAL | Status: AC
  Filled 2022-01-06: qty 30

## 2022-01-06 MED ORDER — LACTATED RINGERS IV SOLN: INTRAVENOUS | Status: DC

## 2022-01-06 MED ORDER — SODIUM CHLORIDE 0.9 % IR SOLN
Status: DC | PRN
  Administered 2022-01-06: 1000 mL

## 2022-01-06 MED ORDER — ONDANSETRON HCL 4 MG/2ML IJ SOLN
INTRAMUSCULAR | Status: DC | PRN
  Administered 2022-01-06: 4 mg via INTRAVENOUS

## 2022-01-06 MED ORDER — PHENYLEPHRINE HCL-NACL 20-0.9 MG/250ML-% IV SOLN
INTRAVENOUS | Status: DC | PRN
  Administered 2022-01-06: 60 ug/min via INTRAVENOUS

## 2022-01-06 MED ORDER — PROPOFOL 10 MG/ML IV BOLUS
INTRAVENOUS | Status: DC | PRN
  Administered 2022-01-06: 140 mg via INTRAVENOUS

## 2022-01-06 MED ORDER — OXYCODONE HCL 5 MG/5ML PO SOLN: 5.0000 mg | Freq: Once | ORAL | Status: DC | PRN

## 2022-01-06 MED ORDER — PROPOFOL 500 MG/50ML IV EMUL
INTRAVENOUS | Status: DC | PRN
  Administered 2022-01-06: 125 ug/kg/min via INTRAVENOUS

## 2022-01-06 MED ORDER — ORAL CARE MOUTH RINSE: 15.0000 mL | Freq: Once | OROMUCOSAL | Status: AC

## 2022-01-06 MED ORDER — ONDANSETRON HCL 4 MG/2ML IJ SOLN
4.0000 mg | Freq: Once | INTRAMUSCULAR | Status: DC | PRN
Start: 2022-01-06 — End: 2022-01-06

## 2022-01-06 MED ORDER — CHLORHEXIDINE GLUCONATE 0.12 % MT SOLN
OROMUCOSAL | Status: AC
  Administered 2022-01-06: 15 mL via OROMUCOSAL
  Filled 2022-01-06: qty 15

## 2022-01-06 MED ORDER — CEFAZOLIN SODIUM-DEXTROSE 2-3 GM-%(50ML) IV SOLR
INTRAVENOUS | Status: DC | PRN
  Administered 2022-01-06: 2 g via INTRAVENOUS

## 2022-01-06 MED ORDER — SUGAMMADEX SODIUM 200 MG/2ML IV SOLN
INTRAVENOUS | Status: DC | PRN
  Administered 2022-01-06: 200 mg via INTRAVENOUS

## 2022-01-06 MED ORDER — ACETAMINOPHEN 160 MG/5ML PO SOLN: 325.0000 mg | ORAL | Status: DC | PRN

## 2022-01-06 MED ORDER — CEFADROXIL 500 MG PO CAPS
500.0000 mg | ORAL_CAPSULE | Freq: Two times a day (BID) | ORAL | 0 refills | Status: AC
  Filled 2022-01-06: qty 20, 10d supply, fill #0

## 2022-01-06 MED ORDER — HEMOSTATIC AGENTS (NO CHARGE) OPTIME
TOPICAL | Status: DC | PRN
  Administered 2022-01-06: 1 via TOPICAL

## 2022-01-06 MED ORDER — HYDROCODONE-ACETAMINOPHEN 5-325 MG PO TABS
1.0000 | ORAL_TABLET | Freq: Four times a day (QID) | ORAL | 0 refills | Status: AC | PRN
Start: 2022-01-06 — End: 2022-01-09
  Filled 2022-01-06: qty 10, 3d supply, fill #0

## 2022-01-06 MED ORDER — LIDOCAINE-EPINEPHRINE 1 %-1:100000 IJ SOLN
INTRAMUSCULAR | Status: DC | PRN
  Administered 2022-01-06: 10 mL

## 2022-01-06 MED ORDER — GLYCOPYRROLATE PF 0.2 MG/ML IJ SOSY
PREFILLED_SYRINGE | INTRAMUSCULAR | Status: DC | PRN
  Administered 2022-01-06 (×2): .1 mg via INTRAVENOUS

## 2022-01-06 SURGICAL SUPPLY — 43 items
BALLN FRONTAL NUVENT 6X17 70D (BALLOONS) ×1
BALLOON FRONTAL NVNT 6X17 70D (BALLOONS) IMPLANT
BLADE ROTATE TRICUT 4X13 M4 (BLADE) ×1 IMPLANT
BLADE SURG 15 STRL LF DISP TIS (BLADE) IMPLANT
BLADE SURG 15 STRL SS (BLADE) ×1
CANISTER SUCT 3000ML PPV (MISCELLANEOUS) ×2 IMPLANT
COAGULATOR SUCT 8FR VV (MISCELLANEOUS) IMPLANT
DRAPE HALF SHEET 40X57 (DRAPES) IMPLANT
DRSG NASOPORE 8CM (GAUZE/BANDAGES/DRESSINGS) ×1 IMPLANT
ELECT REM PT RETURN 9FT ADLT (ELECTROSURGICAL) ×1
ELECTRODE REM PT RTRN 9FT ADLT (ELECTROSURGICAL) ×1 IMPLANT
GLOVE BIO SURGEON STRL SZ 6.5 (GLOVE) ×1 IMPLANT
HEMOSTAT ARISTA ABSORB 3G PWDR (HEMOSTASIS) IMPLANT
INFLATOR BALLN (BALLOONS) ×1
INFLATOR BALLOON W/TUBE (BALLOONS) IMPLANT
KIT BASIN OR (CUSTOM PROCEDURE TRAY) ×1 IMPLANT
KIT TURNOVER KIT B (KITS) ×1 IMPLANT
NDL HYPO 25GX1X1/2 BEV (NEEDLE) ×2 IMPLANT
NDL SPNL 25GX3.5 QUINCKE BL (NEEDLE) ×1 IMPLANT
NEEDLE HYPO 25GX1X1/2 BEV (NEEDLE) ×2 IMPLANT
NEEDLE SPNL 25GX3.5 QUINCKE BL (NEEDLE) ×1 IMPLANT
NS IRRIG 1000ML POUR BTL (IV SOLUTION) ×1 IMPLANT
PAD ARMBOARD 7.5X6 YLW CONV (MISCELLANEOUS) ×2 IMPLANT
PATTIES SURGICAL .5 X3 (DISPOSABLE) ×1 IMPLANT
PENCIL SMOKE EVACUATOR (MISCELLANEOUS) IMPLANT
SOL ANTI FOG 6CC (MISCELLANEOUS) ×1 IMPLANT
SOLUTION ANTI FOG 6CC (MISCELLANEOUS) ×1
SPLINT NASAL DOYLE BI-VL (GAUZE/BANDAGES/DRESSINGS) IMPLANT
SPLINT NASAL POSISEP X .6X2 (GAUZE/BANDAGES/DRESSINGS) IMPLANT
SUT CHROMIC 4 0 P 3 18 (SUTURE) IMPLANT
SUT PLAIN 4 0 ~~LOC~~ 1 (SUTURE) IMPLANT
SUT SILK 2 0 SH (SUTURE) ×1 IMPLANT
SWAB COLLECTION DEVICE MRSA (MISCELLANEOUS) IMPLANT
SWAB CULTURE ESWAB REG 1ML (MISCELLANEOUS) IMPLANT
SYR CONTROL 10ML LL (SYRINGE) IMPLANT
SYR TB 1ML LUER SLIP (SYRINGE) ×2 IMPLANT
TOWEL GREEN STERILE FF (TOWEL DISPOSABLE) ×1 IMPLANT
TRACKER ENT INSTRUMENT (MISCELLANEOUS) ×2 IMPLANT
TRACKER ENT PATIENT (MISCELLANEOUS) ×1 IMPLANT
TRAY ENT MC OR (CUSTOM PROCEDURE TRAY) ×1 IMPLANT
TUBE CONNECTING 12X1/4 (SUCTIONS) ×1 IMPLANT
TUBING EXTENTION W/L.L. (IV SETS) IMPLANT
TUBING STRAIGHTSHOT EPS 5PK (TUBING) ×1 IMPLANT

## 2022-01-06 NOTE — Anesthesia Postprocedure Evaluation (Signed)
Anesthesia Post Note  Patient: VIANNY SCHRAEDER  Procedure(s) Performed: RIGHT FUNCTIONAL ENDOSCOPIC SINUS SURGERY (Right: Nose) BALLOON SINUPLASTY (Right: Nose) TOTAL ETHMOIDECTOMY (Right: Nose) RIGHT FRONTAL RECESS EXPLORATION (Right: Nose) RIGHT MAXILLARY ANTROSTOMY (Right: Nose)     Patient location during evaluation: PACU Anesthesia Type: General Level of consciousness: awake and alert Pain management: pain level controlled Vital Signs Assessment: post-procedure vital signs reviewed and stable Respiratory status: spontaneous breathing, nonlabored ventilation, respiratory function stable and patient connected to nasal cannula oxygen Cardiovascular status: blood pressure returned to baseline and stable Postop Assessment: no apparent nausea or vomiting Anesthetic complications: no   No notable events documented.  Last Vitals:  Vitals:   01/06/22 1045 01/06/22 1100  BP: (!) 160/68 (!) 167/70  Pulse: 60 (!) 53  Resp: 11 10  Temp:  36.6 C  SpO2: 95% 99%    Last Pain:  Vitals:   01/06/22 1100  TempSrc:   PainSc: 0-No pain                 Louanne Calvillo

## 2022-01-06 NOTE — Anesthesia Preprocedure Evaluation (Addendum)
Anesthesia Evaluation  Patient identified by MRN, date of birth, ID band Patient awake    Reviewed: Allergy & Precautions, H&P , NPO status , Patient's Chart, lab work & pertinent test results  History of Anesthesia Complications (+) PONV and history of anesthetic complications  Airway Mallampati: II  TM Distance: >3 FB Neck ROM: Full    Dental  (+) Teeth Intact, Dental Advisory Given, Edentulous Upper, Upper Dentures, Partial Lower   Pulmonary former smoker,    breath sounds clear to auscultation       Cardiovascular hypertension, + dysrhythmias  Rhythm:Regular Rate:Normal     Neuro/Psych    GI/Hepatic GERD  ,  Endo/Other  Hypothyroidism   Renal/GU      Musculoskeletal  (+) Arthritis , Osteoarthritis,    Abdominal   Peds  Hematology  (+) Blood dyscrasia, anemia ,   Anesthesia Other Findings   Reproductive/Obstetrics                             Anesthesia Physical  Anesthesia Plan  ASA: 3  Anesthesia Plan: General   Post-op Pain Management: Ofirmev IV (intra-op)*   Induction: Intravenous  PONV Risk Score and Plan: 3 and Ondansetron, Dexamethasone, Treatment may vary due to age or medical condition and TIVA  Airway Management Planned: Oral ETT  Additional Equipment: None  Intra-op Plan:   Post-operative Plan: Extubation in OR  Informed Consent: I have reviewed the patients History and Physical, chart, labs and discussed the procedure including the risks, benefits and alternatives for the proposed anesthesia with the patient or authorized representative who has indicated his/her understanding and acceptance.     Dental advisory given  Plan Discussed with: CRNA and Anesthesiologist  Anesthesia Plan Comments: (  )       Anesthesia Quick Evaluation

## 2022-01-06 NOTE — Op Note (Signed)
OPERATIVE NOTE  Marie Kelly Date/Time of Admission: 01/06/2022  6:19 AM  CSN: [id number];UXN:[id number] Attending Provider: Ebbie Latus A, DO Room/Bed: MCPO/NONE DOB: 1945-05-29 Age: 76 y.o.   Pre-Op Diagnosis: Chronic right maxillary sinusitis,Chronic pansinusitis  Post-Op Diagnosis: Chronic right maxillary sinusitis,Chronic pansinusitis  Procedure: Procedure(s): RIGHT FUNCTIONAL ENDOSCOPIC SINUS SURGERY WITH IMAGE GUIDANCE WITH MAXILLARY ANTROSTOMY WITH TISSUE REMOVAL, TOTAL ETHMOIDECTOMY and  RIGHT FRONTAL RECESS EXPLORATION  Anesthesia: General  Surgeon(s): Kalonji Zurawski A Nikala Walsworth, DO  Staff: Circulator: Hal Morales, RN Relief Circulator: Celene Squibb, RN Scrub Person: Dollene Cleveland T  Implants: * No implants in log *  Specimens: ID Type Source Tests Collected by Time Destination  1 : Right Sinus Contents Tissue PATH Sinus Contents/Nasal Polyps SURGICAL PATHOLOGY Hydie Langan A, DO 01/06/2022 2542   A : Right Sinus Cultures  Tissue PATH Sinus Contents/Nasal Polyps AEROBIC/ANAEROBIC CULTURE W GRAM STAIN (SURGICAL/DEEP WOUND) Ammon Muscatello A, DO 11/20/2374 2831     Complications: None  EBL: 30 ML  Condition: stable  Operative Findings:  Postsurgical changes consistent with previous sinus surgery, with partially resected middle turbinate. Polypoid edema obstructing maxillary sinus os, with copious purulence within sinus itself.  Osteitic changes of residual ethmoid bones noted, frontal recess obstructed with polypoid edema.  Description of Operation: Once operative consent was obtained and the site and surgery were confirmed with the patient and the operating room team, the patient was brought back to the operating room and general endotracheal anesthesia was obtained. The patient was turned over to the ENT service, at which time the image-guided system was attached and noted to be in good calibration. Lidocaine 1% with 1:100,000 epinephrine was  injected into remainder of the right middle turbinate and the axilla between the medial turbinate and the lateral nasal wall. Afrin-soaked pledgets were placed into the nasal cavity, and the patient was prepped and draped in sterile fashion. Attention turned to the right-sided sinonasal cavity. The remaining portion of the middle turbinate was medialized and a ball-tipped seeker was used to slightly anterior fracture the uncinate process. Copious purulent drainage from the largely occluded maxillary sinus was noted. This was cultured. The navigation frontal balloon was used to locate the frontal recess and confirm placement in the frontal sinus, and the frontal recess was dilated at 2 locations to 6 mm. The recess was then widenend with care taken to avoid circumfrenital trauma. Attention was then turned to the uncinate process, which was completely fractured anteriorly and then removed with a combination of backbiter and a natural os of the maxillary sinus, and it was widened with combination of the backbiter, the microdebrider, the olive tip suction, and the straight TruCut until it was widely patent. Additional polypoid tissue was removed from the maxillary sinus using the microdebrider.  The remaining partitions of the ethmoid air cells were then taken down in a controlled fashion, first identifying the lamina papyracea, the skull base and the anterior face of the sphenoid sinus.  This was done using combination of the straight suction, the up-biting Tru-Cut forceps, the J curette and the microdebrider.  Again, the maxillary and frontal sinuses were copiously irrigated and epinephrine soaked pledgets were placed in the right sinonasal cavity.  Pledgets were removed, copious additional irrigation was placed in the sinonasal cavities and Arista as well as Posisep absorbable nasal packing was placed lateral to the middle turbinate in the axilla between it and the lateral nasal wall. An orogastric tube was placed  and the stomach cavity was suctioned to reduce  postoperative nausea. The patient was turned over to anesthesia service and was extubated in the operating room and transferred to the PACU in stable condition. The patient will be discharged today and followed up in the ENT clinic in 1 week for postoperative check.    Jason Coop, [facility]  01/06/2022

## 2022-01-06 NOTE — Anesthesia Procedure Notes (Signed)
Procedure Name: Intubation Date/Time: 01/06/2022 9:33 AM  Performed by: Darletta Moll, CRNAPre-anesthesia Checklist: Patient identified, Emergency Drugs available, Suction available and Patient being monitored Patient Re-evaluated:Patient Re-evaluated prior to induction Oxygen Delivery Method: Circle system utilized Preoxygenation: Pre-oxygenation with 100% oxygen Induction Type: IV induction Ventilation: Mask ventilation without difficulty Laryngoscope Size: Mac and 3 Grade View: Grade I Tube type: Oral Tube size: 7.0 mm Number of attempts: 1 Airway Equipment and Method: Stylet and Oral airway Placement Confirmation: ETT inserted through vocal cords under direct vision, positive ETCO2 and breath sounds checked- equal and bilateral Secured at: 21 cm Tube secured with: Tape Dental Injury: Teeth and Oropharynx as per pre-operative assessment

## 2022-01-06 NOTE — H&P (Signed)
Marie Kelly is an 76 y.o. female.    Chief Complaint:  Chronic right sinusitis  HPI: Patient presents today for planned elective procedure.  She denies any interval change in history since office visit on 08/10/2021.  Patient has been treated with multiple rounds of antibiotics and steroids with temporary improvement in symptoms. She continues to experience right-sided facial pressure. Patient has history of nasal polyposis and sinus surgery over 20 years ago in [place].  She continues to use nasal saline rinses on a daily basis in conjunction with Singulair for her allergic rhinitis.  Past Medical History:  Diagnosis Date   Anemia    pt. denies this dx   Arthritis    Asthma    Closed disp fx of right olecranon with intraartic exten w nonunion 04/19/2014   Colon polyps    Complication of anesthesia    2 days after surgery-muscles hurtand spasmed-needed a muscle relaxant    Diverticulosis    Dysrhythmia    Fracture of right olecranon process 10/26/2013   GERD (gastroesophageal reflux disease)    hx - diet controlled, no longer an issue   History of cold sores    Hyperlipidemia    had been on lipitor changes to pravachol cause of cost and tricor has since stopped  NO  se reported    Hypertension    Hypothyroidism     on meds for years   Impaired fasting glucose    Olecranon fracture 10/25/2013   right arm   Pneumonia 04/2019   x 1   PONV (postoperative nausea and vomiting)    Ventricular bigeminy seen on cardiac monitor    Cardiac work up Dr Berl[facility]   Wears dentures    top-partial bottom   Wears glasses    at night driving and reading    Past Surgical History:  Procedure Laterality Date   BELPHAROPTOSIS REPAIR Bilateral    CATARACT EXTRACTION Bilateral 11/2016   CHOLECYSTECTOMY     COLONOSCOPY     HARVEST BONE GRAFT Right 04/19/2014   Procedure: HARVEST LEFT TIBIA BONE GRAFT ;  Surgeon: Johnny Bridge, MD;  Location: [facility];   Service: Orthopedics;  Laterality: Right;   HEMORROIDECTOMY     KNEE ARTHROSCOPY Right    NASAL SINUS SURGERY     ORIF ELBOW FRACTURE Right 10/26/2013   Procedure: RIGHT ELBOW FRACTUE OPEN TREATMENT ULNAR PROXIMAL END OLECRANON PROCESS INCLUDES INTERNAL FIXATION;  Surgeon: Johnny Bridge, MD;  Location: [facility];  Service: Orthopedics;  Laterality: Right;   ORIF ELBOW FRACTURE Right 04/19/2014   Procedure: OPEN REDUCTION INTERNAL FIXATION (ORIF) RIGHT ELBOW/OLECRANON NONUNION FRACTURE WITH HARDWARE REMOVAL;  Surgeon: Johnny Bridge, MD;  Location: [facility];  Service: Orthopedics;  Laterality: Right;   TONSILLECTOMY     TUBAL LIGATION     UPPER GI ENDOSCOPY     uterus repair      Family History  Problem Relation Age of Onset   Stroke Mother    Diabetes Father    Heart disease Father    Alzheimer's disease Other    Stomach cancer Maternal Aunt    Colon cancer Neg Hx    Pancreatic cancer Neg Hx     Social History:  reports that she quit smoking about 48 years ago. Her smoking use included cigarettes. She has never used smokeless tobacco. She reports current alcohol use of about 7.0 standard drinks of alcohol per week. She reports that  she does not use drugs.  Allergies:  Allergies  Allergen Reactions   Crestor [Rosuvastatin]     myalgias   Hydrochlorothiazide     Muscles tightness and felt like she was hit by a truck   Lipitor [Atorvastatin]     Myalgias     Medications Prior to Admission  Medication Sig Dispense Refill   albuterol (VENTOLIN HFA) 108 (90 Base) MCG/ACT inhaler Inhale 2 puffs into the lungs every 6 (six) hours as needed for wheezing or shortness of breath.     CALCIUM PO Take 3 tablets by mouth daily. Calcium Bone Maker     carvedilol (COREG) 6.25 MG tablet Take 0.5 tablets (3.125 mg total) by mouth 2 (two) times daily with a meal. Dosage change 90 tablet 3   Cholecalciferol (VITAMIN D) 50 MCG (2000 UT) CAPS Take 4,000 Units  by mouth daily.     Evolocumab (REPATHA SURECLICK) 301 MG/ML SOAJ Inject 1 mL into the skin every 14 (fourteen) days. 6 mL 3   fluticasone-salmeterol (ADVAIR) 250-50 MCG/ACT AEPB Inhale 1 puff into the lungs in the morning and at bedtime. (Patient taking differently: Inhale 1 puff into the lungs 2 (two) times daily as needed (Asthma).) 180 each 3   latanoprost (XALATAN) 0.005 % ophthalmic solution Instill 1 drop into each eye each everning 2.5 mL 1   levothyroxine (SYNTHROID) 75 MCG tablet TAKE 1 TABLET MONDAY THROUGH SATURDAY AND TAKE 2 TABLETS ON SUNDAYS AS DIRECTED 104 tablet 1   MAGNESIUM GLUCONATE PO Take 800 mg by mouth 2 (two) times daily. 400 mg each     montelukast (SINGULAIR) 10 MG tablet Take 1 tablet (10 mg total) by mouth at bedtime. SCHEDULE AN APPT FOR FUTURE REFILLS 90 tablet 0   telmisartan (MICARDIS) 80 MG tablet TAKE 1 TABLET DAILY 90 tablet 3   timolol (TIMOPTIC) 0.5 % ophthalmic solution Place 1 drop into both eyes 2 (two) times daily.     valACYclovir (VALTREX) 500 MG tablet Take 500 mg by mouth daily.     Fluticasone-Salmeterol (ADVAIR) 250-50 MCG/DOSE AEPB USE 1 INHALATION TWICE A DAY (Patient not taking: Reported on 12/31/2021) 180 each 2   mupirocin ointment (BACTROBAN) 2 % Add 1 inch strip to nasal saline rinse and perform rinse with ointment twice daily (Patient not taking: Reported on 12/31/2021) 22 g 0    No results found for this or any previous visit (from the past 48 hour(s)). No results found.  ROS: ROS  Blood pressure (!) 123/98, pulse (!) 52, temperature 97.7 F (36.5 C), temperature source Oral, resp. rate 18, height '5\' 3"'$  (1.6 m), weight 77.1 kg, SpO2 99 %.  PHYSICAL EXAM: Physical Exam Constitutional:      Appearance: Normal appearance.  HENT:     Right Ear: External ear normal.     Left Ear: External ear normal.     Mouth/Throat:     Mouth: Mucous membranes are moist.  Pulmonary:     Effort: Pulmonary effort is normal.  Neurological:      General: No focal deficit present.     Mental Status: She is alert.  Psychiatric:        Mood and Affect: Mood normal.        Behavior: Behavior normal.     Studies Reviewed: CT Sinus reviewed   Assessment/Plan Marie Kelly is a 76 y.o. female with history of allergic rhinitis and nasal polyposis, status post sinus surgery performed in [place] over 20 years ago, with right-sided nasal  pressure, congestion, and intermittent discolored nasal crusting. Patient has been treated with 2 courses of Bactrim, as well as oral steroids with some improvement of her symptoms but with persistent facial pressure and purulent nasal drainage. CT sinus performed demonstrated near-total to total opacification of the right maxillary, ethmoid and frontal sinuses. -To OR for right functional endoscopic sinus surgery with total ethmoidectomy, right maxillary antrostomy, right frontal recess exploration. Risks of surgery, including bleeding, meningitis, leakage of cerebral spinal fluid, eye injury, injury to the tear duct system causing excessive tearing, numbness of the upper teeth and gums, damage to the olfactory nerves causing loss of smell, excessive crust formation after the operation were reviewed with patient.   Brennen Camper A Icis Budreau 01/06/2022, 7:44 AM

## 2022-01-06 NOTE — Discharge Instructions (Signed)
[facility] Post Operative Instructions  Office: [phone]  The Surgery Itself Endoscopic sinus surgery (with or without septoplasty and turbinate reduction) involves general anesthesia, typically for one to two hours. Patients may be sedated for several hours after surgery and may remain sleepy for the better part of the day. Nausea and vomiting are occasionally seen, and usually resolve by the evening of surgery - even without additional medications. Almost all patients can go home the day of surgery.  After Surgery  Facial pressure and fullness similar to a sinus infection/headache is normal after surgery. Breathing through your nose is also difficult due to swelling. A humidifier or vaporizer can be used in the bedroom to prevent throat pain with mouth breathing.   Bloody nasal drainage is normal after this surgery for 5-7 days, usually decreasing in volume with each day that passes. Drainage will flow from the front of the nose and down the back of the throat. Make sure you spit out blood drainage that drips down the back of your throat to prevent nausea/vomiting. You will have a nasal drip pad/sling with gauze to catch drainage from the front of your nose. The dressing may need to be changed frequently during the first 24 hours following surgery. In case of profuse nasal bleeding, you may apply ice to the bridge of the nose and pinch the nose just above the tip and hold for 10 minutes; if bleeding continues, contact the doctors office.   Frequent hot showers or saline nasal rinses (NeilMed) will help break up congestion and clear any clot or mucus that builds up within the nose after surgery. This can be started the day after surgery. Use nasal saline spray every 2-4 hours, ideally.   It is more comfortable to sleep with extra pillows or in a recliner for the first few days after surgery until the drainage begins to resolve.    Do not blow your nose for 2 weeks  after surgery.   Avoid lifting > 10 lbs. and no vigorous exercise for 2 weeks after Surgery.   Avoid airplane travel for 2 weeks following sinus surgery; the cabin pressure changes can cause pain and swelling within the nose/sinuses.   Sense of smell and taste are often diminished for several weeks after surgery. There may be some tenderness or numbness in your upper front teeth, which is normal after surgery. You may express old clot, discolored mucus or very large nasal crusts from your nose for up to 3-4 weeks after surgery; depending on how frequently and how effectively you irrigate your nose with the saltwater spray.   You may have absorbable sutures inside of your nose after surgery that will slowly dissolve in 2-3 weeks. Be careful when clearing crusts from the nose since they may be attached to these sutures.  Medications  Pain medication can be used for pain as prescribed. Pain and pressure in the nose is expected after surgery. As the surgical site heals, pain will resolve over the course of a week. Pain medications can cause nausea, which can be prevented if you take them with food or milk.   You may be given an antibiotic for one week after surgery to prevent infection. Take this medication with food to prevent nausea or vomiting.   You can use 2 nasal sprays after surgery: Afrin can be used up to 2 times a day for up to 5 days after surgery (best before bed) to reduce bloody drainage from the nose  for the first few days after surgery. Saline/salt water spray can be used as often as you would like starting the day after surgery to prevent crusting inside of the nose.   Take all of your routine medications as prescribed, unless told otherwise by your surgeon. Any medications that thin the blood should be avoided. This includes aspirin. Avoid aspirin-like products for the first 72 hours after surgery (Advil, Motrin, Excedrin, Alieve, Celebrex, Naprosyn), but you may use them as needed  for pain after 72 hours.

## 2022-01-06 NOTE — Transfer of Care (Signed)
Immediate Anesthesia Transfer of Care Note  Patient: Marie Kelly  Procedure(s) Performed: RIGHT FUNCTIONAL ENDOSCOPIC SINUS SURGERY (Right: Nose) BALLOON SINUPLASTY (Right: Nose) TOTAL ETHMOIDECTOMY (Right: Nose) RIGHT FRONTAL RECESS EXPLORATION (Right: Nose) RIGHT MAXILLARY ANTROSTOMY (Right: Nose)  Patient Location: PACU  Anesthesia Type:General  Level of Consciousness: drowsy and patient cooperative  Airway & Oxygen Therapy: Patient Spontanous Breathing  Post-op Assessment: Report given to RN, Post -op Vital signs reviewed and stable and Patient moving all extremities X 4  Post vital signs: Reviewed and stable  Last Vitals:  Vitals Value Taken Time  BP 139/65 01/06/22 1033  Temp    Pulse 67 01/06/22 1036  Resp 16 01/06/22 1036  SpO2 94 % 01/06/22 1036  Vitals shown include unvalidated device data.  Last Pain:  Vitals:   01/06/22 0718  TempSrc:   PainSc: 0-No pain         Complications: No notable events documented.

## 2022-01-07 ENCOUNTER — Encounter (HOSPITAL_COMMUNITY): Payer: Self-pay | Admitting: Otolaryngology

## 2022-01-07 LAB — SURGICAL PATHOLOGY

## 2022-01-11 LAB — AEROBIC/ANAEROBIC CULTURE W GRAM STAIN (SURGICAL/DEEP WOUND)

## 2022-01-26 DIAGNOSIS — J320 Chronic maxillary sinusitis: Secondary | ICD-10-CM | POA: Diagnosis not present

## 2022-02-04 ENCOUNTER — Encounter (INDEPENDENT_AMBULATORY_CARE_PROVIDER_SITE_OTHER): Payer: Self-pay

## 2022-02-04 DIAGNOSIS — H0279 Other degenerative disorders of eyelid and periocular area: Secondary | ICD-10-CM | POA: Diagnosis not present

## 2022-02-04 DIAGNOSIS — H401130 Primary open-angle glaucoma, bilateral, stage unspecified: Secondary | ICD-10-CM | POA: Diagnosis not present

## 2022-02-04 DIAGNOSIS — H57813 Brow ptosis, bilateral: Secondary | ICD-10-CM | POA: Diagnosis not present

## 2022-02-04 DIAGNOSIS — H02831 Dermatochalasis of right upper eyelid: Secondary | ICD-10-CM | POA: Diagnosis not present

## 2022-02-04 DIAGNOSIS — H02421 Myogenic ptosis of right eyelid: Secondary | ICD-10-CM | POA: Diagnosis not present

## 2022-02-04 DIAGNOSIS — H04123 Dry eye syndrome of bilateral lacrimal glands: Secondary | ICD-10-CM | POA: Diagnosis not present

## 2022-02-04 DIAGNOSIS — J329 Chronic sinusitis, unspecified: Secondary | ICD-10-CM | POA: Diagnosis not present

## 2022-02-04 DIAGNOSIS — H05412 Enophthalmos due to atrophy of orbital tissue, left eye: Secondary | ICD-10-CM | POA: Diagnosis not present

## 2022-02-10 ENCOUNTER — Other Ambulatory Visit (HOSPITAL_BASED_OUTPATIENT_CLINIC_OR_DEPARTMENT_OTHER): Payer: Self-pay

## 2022-02-11 ENCOUNTER — Encounter (INDEPENDENT_AMBULATORY_CARE_PROVIDER_SITE_OTHER): Payer: Self-pay | Admitting: Ophthalmology

## 2022-02-11 ENCOUNTER — Ambulatory Visit (INDEPENDENT_AMBULATORY_CARE_PROVIDER_SITE_OTHER): Payer: Medicare Other | Admitting: Ophthalmology

## 2022-02-11 ENCOUNTER — Encounter (INDEPENDENT_AMBULATORY_CARE_PROVIDER_SITE_OTHER): Payer: Medicare Other | Admitting: Ophthalmology

## 2022-02-11 DIAGNOSIS — H35371 Puckering of macula, right eye: Secondary | ICD-10-CM

## 2022-02-11 DIAGNOSIS — H33101 Unspecified retinoschisis, right eye: Secondary | ICD-10-CM | POA: Diagnosis not present

## 2022-02-11 DIAGNOSIS — H43821 Vitreomacular adhesion, right eye: Secondary | ICD-10-CM | POA: Diagnosis not present

## 2022-02-11 DIAGNOSIS — H43813 Vitreous degeneration, bilateral: Secondary | ICD-10-CM

## 2022-02-11 DIAGNOSIS — R931 Abnormal findings on diagnostic imaging of heart and coronary circulation: Secondary | ICD-10-CM | POA: Diagnosis not present

## 2022-02-11 NOTE — Assessment & Plan Note (Signed)
No change over the last 1 year.  Continue to observe, if acuity declines or schisis worsens may consider vitrectomy membrane peel at that time

## 2022-02-11 NOTE — Assessment & Plan Note (Signed)
Appears stable by OCT.  Not clinically evident.

## 2022-02-11 NOTE — Assessment & Plan Note (Signed)
Physiologic OU 

## 2022-02-11 NOTE — Progress Notes (Signed)
02/11/2022     CHIEF COMPLAINT Patient presents for  Chief Complaint  Patient presents with   Epiretinal Membrane/preretinal Fibrosis/cellophane Maculopathy      HISTORY OF PRESENT ILLNESS: Marie Kelly is a 76 y.o. female who presents to the clinic today for:   HPI   1 YR FU OU OCT FP. Pt stated vision has remained stable Pt has sinus surgery on August 28th. since last visit.  Pt had an eyelid lift on OD. Pt is currently taking Latanoprost- 1 drop into both eyes bedtime Timolol- 1 drop into both eyes 2x daily.  Last edited by Silvestre Moment on 02/11/2022 10:49 AM.      Referring physician: Burnis Medin, MD [place],  [place] [address]  HISTORICAL INFORMATION:   Selected notes from the MEDICAL RECORD NUMBER    Lab Results  Component Value Date   HGBA1C 5.6 12/28/2021     CURRENT MEDICATIONS: Current Outpatient Medications (Ophthalmic Drugs)  Medication Sig   latanoprost (XALATAN) 0.005 % ophthalmic solution Instill 1 drop into each eye each everning   timolol (TIMOPTIC) 0.5 % ophthalmic solution Place 1 drop into both eyes 2 (two) times daily.   No current facility-administered medications for this visit. (Ophthalmic Drugs)   Current Outpatient Medications (Other)  Medication Sig   albuterol (VENTOLIN HFA) 108 (90 Base) MCG/ACT inhaler Inhale 2 puffs into the lungs every 6 (six) hours as needed for wheezing or shortness of breath.   CALCIUM PO Take 3 tablets by mouth daily. Calcium Bone Maker   carvedilol (COREG) 6.25 MG tablet Take 0.5 tablets (3.125 mg total) by mouth 2 (two) times daily with a meal. Dosage change   Cholecalciferol (VITAMIN D) 50 MCG (2000 UT) CAPS Take 4,000 Units by mouth daily.   Evolocumab (REPATHA SURECLICK) 485 MG/ML SOAJ Inject 1 mL into the skin every 14 (fourteen) days.   fluticasone-salmeterol (ADVAIR) 250-50 MCG/ACT AEPB Inhale 1 puff into the lungs in the morning and at bedtime. (Patient taking differently:  Inhale 1 puff into the lungs 2 (two) times daily as needed (Asthma).)   Fluticasone-Salmeterol (ADVAIR) 250-50 MCG/DOSE AEPB USE 1 INHALATION TWICE A DAY (Patient not taking: Reported on 12/31/2021)   levothyroxine (SYNTHROID) 75 MCG tablet TAKE 1 TABLET MONDAY THROUGH SATURDAY AND TAKE 2 TABLETS ON SUNDAYS AS DIRECTED   MAGNESIUM GLUCONATE PO Take 800 mg by mouth 2 (two) times daily. 400 mg each   montelukast (SINGULAIR) 10 MG tablet Take 1 tablet (10 mg total) by mouth at bedtime. SCHEDULE AN APPT FOR FUTURE REFILLS   mupirocin ointment (BACTROBAN) 2 % Add 1 inch strip to nasal saline rinse and perform rinse with ointment twice daily (Patient not taking: Reported on 12/31/2021)   telmisartan (MICARDIS) 80 MG tablet TAKE 1 TABLET DAILY   valACYclovir (VALTREX) 500 MG tablet Take 500 mg by mouth daily.   No current facility-administered medications for this visit. (Other)      REVIEW OF SYSTEMS: ROS   Negative for: Constitutional, Gastrointestinal, Neurological, Skin, Genitourinary, Musculoskeletal, HENT, Endocrine, Cardiovascular, Eyes, Respiratory, Psychiatric, Allergic/Imm, Heme/Lymph Last edited by Silvestre Moment on 02/11/2022 10:49 AM.       ALLERGIES Allergies  Allergen Reactions   Crestor [Rosuvastatin]     myalgias   Hydrochlorothiazide     Muscles tightness and felt like she was hit by a truck   Lipitor [Atorvastatin]     Myalgias     PAST MEDICAL HISTORY Past Medical History:  Diagnosis Date  Anemia    pt. denies this dx   Arthritis    Asthma    Closed disp fx of right olecranon with intraartic exten w nonunion 04/19/2014   Colon polyps    Complication of anesthesia    2 days after surgery-muscles hurtand spasmed-needed a muscle relaxant    Diverticulosis    Dysrhythmia    Fracture of right olecranon process 10/26/2013   GERD (gastroesophageal reflux disease)    hx - diet controlled, no longer an issue   History of cold sores    Hyperlipidemia    had been on  lipitor changes to pravachol cause of cost and tricor has since stopped  NO  se reported    Hypertension    Hypothyroidism     on meds for years   Impaired fasting glucose    Olecranon fracture 10/25/2013   right arm   Pneumonia 04/2019   x 1   PONV (postoperative nausea and vomiting)    Ventricular bigeminy seen on cardiac monitor    Cardiac work up Dr Berlinda Last [facility]   Wears dentures    top-partial bottom   Wears glasses    at night driving and reading   Past Surgical History:  Procedure Laterality Date   BALLOON SINUPLASTY Right 01/06/2022   Procedure: BALLOON SINUPLASTY;  Surgeon: Jason Coop, DO;  Location: [place] [facility];  Service: ENT;  Laterality: Right;   [place] Bilateral    CATARACT EXTRACTION Bilateral 11/2016   CHOLECYSTECTOMY     COLONOSCOPY     ETHMOIDECTOMY Right 01/06/2022   Procedure: TOTAL ETHMOIDECTOMY;  Surgeon: Jason Coop, DO;  Location: [facility];  Service: ENT;  Laterality: Right;   FRONTAL SINUS EXPLORATION Right 01/06/2022   Procedure: RIGHT FRONTAL RECESS EXPLORATION;  Surgeon: Jason Coop, DO;  Location: [facility];  Service: ENT;  Laterality: Right;   HARVEST BONE GRAFT Right 04/19/2014   Procedure: HARVEST LEFT TIBIA BONE GRAFT ;  Surgeon: Johnny Bridge, MD;  Location: [facility];  Service: Orthopedics;  Laterality: Right;   HEMORROIDECTOMY     KNEE ARTHROSCOPY Right    MAXILLARY ANTROSTOMY Right 01/06/2022   Procedure: RIGHT MAXILLARY ANTROSTOMY;  Surgeon: Jason Coop, DO;  Location: [facility];  Service: ENT;  Laterality: Right;   NASAL SINUS SURGERY     ORIF ELBOW FRACTURE Right 10/26/2013   Procedure: RIGHT ELBOW FRACTUE OPEN TREATMENT ULNAR PROXIMAL END OLECRANON PROCESS INCLUDES INTERNAL FIXATION;  Surgeon: Johnny Bridge, MD;  Location: [facility];  Service: Orthopedics;  Laterality: Right;   ORIF ELBOW FRACTURE Right 04/19/2014   Procedure: OPEN REDUCTION INTERNAL FIXATION (ORIF)  RIGHT ELBOW/OLECRANON NONUNION FRACTURE WITH HARDWARE REMOVAL;  Surgeon: Johnny Bridge, MD;  Location: [facility];  Service: Orthopedics;  Laterality: Right;   SINUS ENDO WITH FUSION Right 01/06/2022   Procedure: RIGHT FUNCTIONAL ENDOSCOPIC SINUS SURGERY;  Surgeon: Jason Coop, DO;  Location[facility];  Service: ENT;  Laterality: Right;   TONSILLECTOMY     TUBAL LIGATION     UPPER GI ENDOSCOPY     uterus repair      FAMILY HISTORY Family History  Problem Relation Age of Onset   Stroke Mother    Diabetes Father    Heart disease Father    Alzheimer's disease Other    Stomach cancer Maternal Aunt    Colon cancer Neg Hx    Pancreatic cancer Neg Hx     SOCIAL HISTORY Social  History   Tobacco Use   Smoking status: Former    Types: Cigarettes    Quit date: 10/25/1973    Years since quitting: 48.3   Smokeless tobacco: Never  Vaping Use   Vaping Use: Never used  Substance Use Topics   Alcohol use: Yes    Alcohol/week: 7.0 standard drinks of alcohol    Types: 7 Glasses of wine per week   Drug use: No         OPHTHALMIC EXAM:  Base Eye Exam     Visual Acuity (ETDRS)       Right Left   Dist cc 20/20 -2 20/20 -1    Correction: Glasses         Tonometry (Tonopen, 10:53 AM)       Right Left   Pressure 13 12         Pupils       Pupils APD   Right PERRL None   Left PERRL None         Visual Fields       Left Right    Full Full         Extraocular Movement       Right Left    Full, Ortho Full, Ortho         Neuro/Psych     Oriented x3: Yes   Mood/Affect: Normal         Dilation     Both eyes: 1.0% Mydriacyl, 2.5% Phenylephrine @ 10:52 AM           Slit Lamp and Fundus Exam     External Exam       Right Left   External Normal Normal         Slit Lamp Exam       Right Left   Lids/Lashes Normal Normal   Conjunctiva/Sclera White and quiet White and quiet   Cornea Clear Clear   Anterior Chamber  Deep and quiet Deep and quiet   Iris Round and reactive Round and reactive   Lens Centered posterior chamber intraocular lens, 1+ Posterior capsular opacification Centered posterior chamber intraocular lens   Anterior Vitreous Normal Normal         Fundus Exam       Right Left   Posterior Vitreous Posterior vitreous detachment Posterior vitreous detachment   Disc Peripapillary atrophy, oblique insertion Peripapillary atrophy, oblique insertion   C/D Ratio 0.3 0.3   Macula Normal, no detectable CME nor pseudo cystoid CME, no clinically detectable ERM topo distortion Normal   Vessels Normal Normal   Periphery [place] degeneration, inferiorly [place]stone degeneration, inferiorly            IMAGING AND PROCEDURES  Imaging and Procedures for 02/11/22  OCT, Retina - OU - Both Eyes       Right Eye Quality was good. Scan locations included subfoveal. Central Foveal Thickness: 312. Progression has been stable. Findings include epiretinal membrane.   Left Eye Quality was good. Central Foveal Thickness: 243. Progression has been stable. Findings include normal foveal contour.   Notes Macular retinoschisis, stable over the last year.  OD, with no impact on acuity        Color Fundus Photography Optos - OU - Both Eyes       Right Eye Disc findings include normal observations. Macula : normal observations. Vessels : normal observations. Periphery : normal observations.   Left Eye Progression has no prior data. Disc findings include normal observations. Macula :  normal observations. Vessels : normal observations. Periphery : normal observations.   Notes Myopic crescent peripapillary atrophy OU.                ASSESSMENT/PLAN:  Foveomacular schisis, right eye Appears stable by OCT.  Not clinically evident.  Posterior vitreous detachment of both eyes Physiologic OU  Right epiretinal membrane No change over the last 1 year.  Continue to observe, if acuity  declines or schisis worsens may consider vitrectomy membrane peel at that time     ICD-10-CM   1. Right epiretinal membrane  H35.371 OCT, Retina - OU - Both Eyes    Color Fundus Photography Optos - OU - Both Eyes    2. Foveomacular schisis, right eye  H33.101     3. Vitreomacular adhesion of right eye  H43.821     4. Posterior vitreous detachment of both eyes  H43.813       1.  Moderate epiretinal membrane with secondary foveal macular schisis however no interval change over the last 1 year we will continue to observe.  Patient to report new onset visual acuity declines or distortions  2.  Follow-up Dr. Katy Apo as scheduled  3.  Ophthalmic Meds Ordered this visit:  No orders of the defined types were placed in this encounter.      Return in about 1 year (around 02/12/2023) for DILATE OU, OCT, COLOR FP.  There are no Patient Instructions on file for this visit.   Explained the diagnoses, plan, and follow up with the patient and they expressed understanding.  Patient expressed understanding of the importance of proper follow up care.   Clent Demark Everlynn Sagun M.D. Diseases & Surgery of the Retina and Vitreous [facility] 02/11/22     Abbreviations: M myopia (nearsighted); A astigmatism; H hyperopia (farsighted); P presbyopia; Mrx spectacle prescription;  CTL contact lenses; OD right eye; OS left eye; OU both eyes  XT exotropia; ET esotropia; PEK punctate epithelial keratitis; PEE punctate epithelial erosions; DES dry eye syndrome; MGD meibomian gland dysfunction; ATs artificial tears; PFAT's preservative free artificial tears; [place] nuclear sclerotic cataract; PSC posterior subcapsular cataract; ERM epi-retinal membrane; PVD posterior vitreous detachment; RD retinal detachment; DM diabetes mellitus; DR diabetic retinopathy; NPDR non-proliferative diabetic retinopathy; PDR proliferative diabetic retinopathy; CSME clinically significant macular edema; DME diabetic  macular edema; dbh dot blot hemorrhages; CWS cotton wool spot; POAG primary open angle glaucoma; C/D cup-to-disc ratio; HVF humphrey visual field; GVF goldmann visual field; OCT optical coherence tomography; IOP intraocular pressure; BRVO Branch retinal vein occlusion; CRVO central retinal vein occlusion; CRAO central retinal artery occlusion; BRAO branch retinal artery occlusion; RT retinal tear; SB scleral buckle; PPV pars plana vitrectomy; VH Vitreous hemorrhage; PRP panretinal laser photocoagulation; IVK intravitreal kenalog; VMT vitreomacular traction; MH Macular hole;  NVD neovascularization of the disc; NVE neovascularization elsewhere; AREDS age related eye disease study; ARMD age related macular degeneration; POAG primary open angle glaucoma; EBMD epithelial/anterior basement membrane dystrophy; ACIOL anterior chamber intraocular lens; IOL intraocular lens; PCIOL posterior chamber intraocular lens; Phaco/IOL phacoemulsification with intraocular lens placement; [place] photorefractive keratectomy; LASIK laser assisted in situ keratomileusis; HTN hypertension; DM diabetes mellitus; COPD chronic obstructive pulmonary disease

## 2022-02-17 ENCOUNTER — Encounter: Payer: Self-pay | Admitting: Internal Medicine

## 2022-02-18 ENCOUNTER — Other Ambulatory Visit: Payer: Self-pay | Admitting: Internal Medicine

## 2022-02-18 ENCOUNTER — Encounter: Payer: Self-pay | Admitting: Internal Medicine

## 2022-02-19 ENCOUNTER — Other Ambulatory Visit: Payer: Self-pay

## 2022-02-22 ENCOUNTER — Other Ambulatory Visit: Payer: Self-pay

## 2022-02-23 ENCOUNTER — Other Ambulatory Visit: Payer: Self-pay

## 2022-02-24 DIAGNOSIS — L309 Dermatitis, unspecified: Secondary | ICD-10-CM | POA: Diagnosis not present

## 2022-02-24 DIAGNOSIS — D1801 Hemangioma of skin and subcutaneous tissue: Secondary | ICD-10-CM | POA: Diagnosis not present

## 2022-02-24 DIAGNOSIS — L814 Other melanin hyperpigmentation: Secondary | ICD-10-CM | POA: Diagnosis not present

## 2022-02-24 DIAGNOSIS — Z872 Personal history of diseases of the skin and subcutaneous tissue: Secondary | ICD-10-CM | POA: Diagnosis not present

## 2022-02-24 DIAGNOSIS — L821 Other seborrheic keratosis: Secondary | ICD-10-CM | POA: Diagnosis not present

## 2022-02-24 NOTE — Telephone Encounter (Signed)
Please refill carvedilol with updated sig 90 days refill x 1

## 2022-02-26 MED ORDER — CARVEDILOL 6.25 MG PO TABS: 3.1250 mg | ORAL_TABLET | Freq: Two times a day (BID) | ORAL | 1 refills | Status: DC

## 2022-02-26 NOTE — Telephone Encounter (Signed)
Rx sent 

## 2022-03-01 MED ORDER — FLUTICASONE-SALMETEROL 250-50 MCG/ACT IN AEPB: 1.0000 | INHALATION_SPRAY | Freq: Two times a day (BID) | RESPIRATORY_TRACT | 3 refills | Status: DC

## 2022-03-03 ENCOUNTER — Telehealth: Payer: Self-pay | Admitting: Internal Medicine

## 2022-03-03 NOTE — Telephone Encounter (Signed)
Marie Kelly with [facility] [phone] Requesting:  carvedilol (COREG) 6.25 MG tablet  90 day supply with 3 refills  LOV:  12/28/21  [facility] [place], [place] Phone:  [phone]  Fax:  [phone]

## 2022-03-04 ENCOUNTER — Other Ambulatory Visit: Payer: Self-pay

## 2022-03-04 ENCOUNTER — Other Ambulatory Visit (HOSPITAL_BASED_OUTPATIENT_CLINIC_OR_DEPARTMENT_OTHER): Payer: Self-pay

## 2022-03-04 DIAGNOSIS — J0120 Acute ethmoidal sinusitis, unspecified: Secondary | ICD-10-CM | POA: Diagnosis not present

## 2022-03-04 DIAGNOSIS — J320 Chronic maxillary sinusitis: Secondary | ICD-10-CM | POA: Diagnosis not present

## 2022-03-04 DIAGNOSIS — J3089 Other allergic rhinitis: Secondary | ICD-10-CM | POA: Diagnosis not present

## 2022-03-04 MED ORDER — MUPIROCIN 2 % EX OINT
TOPICAL_OINTMENT | CUTANEOUS | 5 refills | Status: DC
  Filled 2022-03-04: qty 22, 30d supply, fill #0
  Filled 2022-03-27 – 2022-07-16 (×2): qty 22, 30d supply, fill #1

## 2022-03-04 MED ORDER — TETANUS-DIPHTH-ACELL PERTUSSIS 5-2.5-18.5 LF-MCG/0.5 IM SUSY
PREFILLED_SYRINGE | INTRAMUSCULAR | 0 refills | Status: DC
  Filled 2022-03-04: qty 0.5, 1d supply, fill #0

## 2022-03-04 MED ORDER — FLUTICASONE PROPIONATE 50 MCG/ACT NA SUSP
2.0000 | Freq: Every day | NASAL | 11 refills | Status: AC
  Filled 2022-03-04: qty 16, 30d supply, fill #0
  Filled 2022-03-27: qty 16, 30d supply, fill #1

## 2022-03-04 MED ORDER — FLUTICASONE-SALMETEROL 250-50 MCG/ACT IN AEPB: 1.0000 | INHALATION_SPRAY | Freq: Two times a day (BID) | RESPIRATORY_TRACT | 3 refills | Status: AC

## 2022-03-04 NOTE — Addendum Note (Signed)
Addended by: Westley Hummer B on: 03/04/2022 08:26 AM   Modules accepted: Orders

## 2022-03-06 NOTE — Telephone Encounter (Signed)
So at last visit ? Decrease to 1/2 po bid  .because of low pulse  Confirm what dose  she is taking correct med list and send 90 days refill for a year .

## 2022-03-08 ENCOUNTER — Other Ambulatory Visit (HOSPITAL_BASED_OUTPATIENT_CLINIC_OR_DEPARTMENT_OTHER): Payer: Self-pay

## 2022-03-08 MED ORDER — SULFAMETHOXAZOLE-TRIMETHOPRIM 800-160 MG PO TABS
ORAL_TABLET | ORAL | 0 refills | Status: DC
  Filled 2022-03-08: qty 20, 10d supply, fill #0

## 2022-03-09 MED ORDER — CARVEDILOL 6.25 MG PO TABS: 3.1250 mg | ORAL_TABLET | Freq: Two times a day (BID) | ORAL | 3 refills | Status: DC

## 2022-03-09 NOTE — Telephone Encounter (Signed)
Confirm with patient. She is taking 0.5 tab 2x a day. Rx sent in 90 days with 3 refills.

## 2022-03-09 NOTE — Addendum Note (Signed)
Addended byEncarnacion Slates on: 03/09/2022 04:55 PM   Modules accepted: Orders

## 2022-03-15 ENCOUNTER — Encounter: Payer: Self-pay | Admitting: Internal Medicine

## 2022-03-15 ENCOUNTER — Ambulatory Visit (INDEPENDENT_AMBULATORY_CARE_PROVIDER_SITE_OTHER): Payer: Medicare Other | Admitting: Internal Medicine

## 2022-03-15 ENCOUNTER — Other Ambulatory Visit (HOSPITAL_BASED_OUTPATIENT_CLINIC_OR_DEPARTMENT_OTHER): Payer: Self-pay

## 2022-03-15 VITALS — BP 114/74 | HR 64 | Ht 63.0 in | Wt 173.0 lb

## 2022-03-15 DIAGNOSIS — E039 Hypothyroidism, unspecified: Secondary | ICD-10-CM

## 2022-03-15 DIAGNOSIS — M859 Disorder of bone density and structure, unspecified: Secondary | ICD-10-CM | POA: Diagnosis not present

## 2022-03-15 MED ORDER — LEVOTHYROXINE SODIUM 75 MCG PO TABS
75.0000 ug | ORAL_TABLET | ORAL | 3 refills | Status: DC
  Filled 2022-03-15: qty 104, 90d supply, fill #0

## 2022-03-15 MED ORDER — PREDNISONE 10 MG PO TABS
ORAL_TABLET | ORAL | 0 refills | Status: DC
  Filled 2022-03-15: qty 17, 6d supply, fill #0

## 2022-03-15 NOTE — Progress Notes (Signed)
Name: Marie Kelly  MRN/ DOB: [id number], [birth year]    Age/ Sex: 76 y.o., female    PCP: Burnis Medin, MD   Reason for Endocrinology Evaluation: Low Bone density      Date of Initial Endocrinology Evaluation: 03/09/2021    HPI: Marie Kelly is a 76 y.o. female with a past medical history of low bone density, Hypothyroidism, and Asthma . The patient presented for initial endocrinology clinic visit on 03/09/2021 for consultative assistance with her Low bone density .   Pt was diagnosed with low bone density : in 09/2014 with a right femoral neck T-Score -1.2   Menarche at age : 5 Menopausal at age : late 13's  Fracture Hx: elbow fracture - fell down the steps  Hx of HRT: no FH of osteoporosis or hip fracture: mother  Prior Hx of anti-estrogenic therapy :no  Prior Hx of anti-resorptive therapy : Fosamax - long time ago    On her initial visit to our clinic she had a TSH of 5.35 uIu/mL , we increased levothyroxine dose  The patient declined antiresorptive therapy on her initial visit with me and opted to continue with lifestyle changes      SUBJECTIVE:    Today (03/15/22):  Marie Kelly is here for follow-up on low bone density and hypothyroidism.   Weight has been fluctuating   Calcium/Mg 3 tablets  Vitamin D 1000 iu daily   Levothyroxine 75 mcg, 2 tabs on Sundays and 1 tab rest of the week    HISTORY:  Past Medical History:  Past Medical History:  Diagnosis Date   Anemia    pt. denies this dx   Arthritis    Asthma    Closed disp fx of right olecranon with intraartic exten w nonunion 04/19/2014   Colon polyps    Complication of anesthesia    2 days after surgery-muscles hurtand spasmed-needed a muscle relaxant    Diverticulosis    Dysrhythmia    Fracture of right olecranon process 10/26/2013   GERD (gastroesophageal reflux disease)    hx - diet controlled, no longer an issue   History of cold sores    Hyperlipidemia    had been on  lipitor changes to pravachol cause of cost and tricor has since stopped  NO  se reported    Hypertension    Hypothyroidism     on meds for years   Impaired fasting glucose    Olecranon fracture 10/25/2013   right arm   Pneumonia 04/2019   x 1   PONV (postoperative nausea and vomiting)    Ventricular bigeminy seen on cardiac monitor    Cardiac work up Dr Berlinda [facility]   Wears dentures    top-partial bottom   Wears glasses    at night driving and reading   Past Surgical History:  Past Surgical History:  Procedure Laterality Date   BALLOON SINUPLASTY Right 01/06/2022   Procedure: BALLOON SINUPLASTY;  Surgeon: Jason Coop, DO;  Location: [place];  Service: ENT;  Laterality: Right;   BELPHAROPTOSIS REPAIR Bilateral    CATARACT EXTRACTION Bilateral 11/2016   CHOLECYSTECTOMY     COLONOSCOPY     ETHMOIDECTOMY Right 01/06/2022   Procedure: TOTAL ETHMOIDECTOMY;  Surgeon: Jason Coop, DO;  Location: [place];  Service: ENT;  Laterality: Right;   FRONTAL SINUS EXPLORATION Right 01/06/2022   Procedure: RIGHT FRONTAL RECESS EXPLORATION;  Surgeon: Ebbie Latus A, DO;  Location: [place];  Service: ENT;  Laterality: Right;   HARVEST BONE GRAFT Right 04/19/2014   Procedure: HARVEST LEFT TIBIA BONE GRAFT ;  Surgeon: Johnny Bridge, MD;  Location: [facility] [place];  Service: Orthopedics;  Laterality: Right;   HEMORROIDECTOMY     KNEE ARTHROSCOPY Right    MAXILLARY ANTROSTOMY Right 01/06/2022   Procedure: RIGHT MAXILLARY ANTROSTOMY;  Surgeon: Jason Coop, DO;  Location: [facility];  Service: ENT;  Laterality: Right;   NASAL SINUS SURGERY     ORIF ELBOW FRACTURE Right 10/26/2013   Procedure: RIGHT ELBOW FRACTUE OPEN TREATMENT ULNAR PROXIMAL END OLECRANON PROCESS INCLUDES INTERNAL FIXATION;  Surgeon: Johnny Bridge, MD;  Location: [facility];  Service: Orthopedics;  Laterality: Right;   ORIF ELBOW FRACTURE Right 04/19/2014   Procedure: OPEN REDUCTION  INTERNAL FIXATION (ORIF) RIGHT ELBOW/OLECRANON NONUNION FRACTURE WITH HARDWARE REMOVAL;  Surgeon: Johnny Bridge, MD;  Location: [place];  Service: Orthopedics;  Laterality: Right;   SINUS ENDO WITH FUSION Right 01/06/2022   Procedure: RIGHT FUNCTIONAL ENDOSCOPIC SINUS SURGERY;  Surgeon: Jason Coop, DO;  Location: [place];  Service: ENT;  Laterality: Right;   TONSILLECTOMY     TUBAL LIGATION     UPPER GI ENDOSCOPY     uterus repair      Social History:  reports that she quit smoking about 48 years ago. Her smoking use included cigarettes. She has never used smokeless tobacco. She reports current alcohol use of about 7.0 standard drinks of alcohol per week. She reports that she does not use drugs. Family History: family history includes Alzheimer's disease in an other family member; Diabetes in her father; Heart disease in her father; Stomach cancer in her maternal aunt; Stroke in her mother.   HOME MEDICATIONS: Allergies as of 03/15/2022       Reactions   Crestor [rosuvastatin]    myalgias   Hydrochlorothiazide    Muscles tightness and felt like she was hit by a truck   Lipitor [atorvastatin]    Myalgias        Medication List        Accurate as of March 15, 2022 11:18 AM. If you have any questions, ask your nurse or doctor.          albuterol 108 (90 Base) MCG/ACT inhaler Commonly known as: VENTOLIN HFA Inhale 2 puffs into the lungs every 6 (six) hours as needed for wheezing or shortness of breath.   Boostrix 5-2.5-18.5 LF-MCG/0.5 injection Generic drug: Tdap Inject into the muscle.   CALCIUM PO Take 3 tablets by mouth daily. Calcium Bone Maker   carvedilol 6.25 MG tablet Commonly known as: COREG Take 0.5 tablets (3.125 mg total) by mouth 2 (two) times daily with a meal. Dosage change   fluticasone 50 MCG/ACT nasal spray Commonly known as: FLONASE Place 2 sprays into both nostrils daily.   fluticasone-salmeterol 250-50 MCG/ACT  Aepb Commonly known as: ADVAIR Inhale 1 puff into the lungs in the morning and at bedtime.   latanoprost 0.005 % ophthalmic solution Commonly known as: XALATAN Instill 1 drop into each eye each everning   levothyroxine 75 MCG tablet Commonly known as: SYNTHROID TAKE 1 TABLET MONDAY THROUGH SATURDAY AND TAKE 2 TABLETS ON SUNDAYS AS DIRECTED   MAGNESIUM GLUCONATE PO Take 800 mg by mouth 2 (two) times daily. 400 mg each   montelukast 10 MG tablet Commonly known as: SINGULAIR TAKE 1 TABLET AT BEDTIME. SCHEDULE AN APPOINTMENT FOR FUTURE REFILLS   mupirocin  ointment 2 % Commonly known as: BACTROBAN Add 1 inch strip to nasal saline rinse and perform rinse with ointment twice daily   mupirocin ointment 2 % Commonly known as: BACTROBAN Add 1 inch strip to nasal saline rinse and perform rinse with ointment twice daily   predniSONE 10 MG tablet Commonly known as: DELTASONE Take 4 tablets (40 mg total) by mouth daily for 3 days, THEN 2 tablets (20 mg total) daily for 2 days, THEN 1 tablet (10 mg total) daily for 1 day.   Repatha SureClick 782 MG/ML Soaj Generic drug: Evolocumab Inject 1 mL into the skin every 14 (fourteen) days.   sulfamethoxazole-trimethoprim 800-160 MG tablet Commonly known as: BACTRIM DS Take 1 tablet by mouth 2 times daily for 10 days.   telmisartan 80 MG tablet Commonly known as: MICARDIS TAKE 1 TABLET DAILY   timolol 0.5 % ophthalmic solution Commonly known as: TIMOPTIC Place 1 drop into both eyes 2 (two) times daily.   valACYclovir 500 MG tablet Commonly known as: VALTREX Take 500 mg by mouth daily.   Vitamin D 50 MCG (2000 UT) Caps Take 4,000 Units by mouth daily.          REVIEW OF SYSTEMS: A comprehensive ROS was conducted with the patient and is negative except as per HPI    OBJECTIVE:  VS: BP 114/74 (BP Location: Left Arm, Patient Position: Sitting, Cuff Size: Small)   Pulse 64   Ht '5\' 3"'$  (1.6 m)   Wt 173 lb (78.5 kg)   SpO2 98%    BMI 30.65 kg/m    Wt Readings from Last 3 Encounters:  03/15/22 173 lb (78.5 kg)  01/06/22 170 lb (77.1 kg)  12/28/21 170 lb 3.2 oz (77.2 kg)     EXAM: General: Pt appears well and is in NAD  Neck: General: Supple without adenopathy. Thyroid: Thyroid size normal.  No goiter or nodules appreciated.   Lungs: Clear with good BS bilat with no rales, rhonchi, or wheezes  Heart: Auscultation: RRR.  Abdomen: Normoactive bowel sounds, soft, nontender, without masses or organomegaly palpable  Extremities:  BL LE: No pretibial edema normal ROM and strength.  Skin: Hair: Texture and amount normal with gender appropriate distribution Skin Inspection: No rashes Skin Palpation: Skin temperature, texture, and thickness normal to palpation  Mental Status: Judgment, insight: Intact Orientation: Oriented to time, place, and person Memory: Intact for recent and remote events Mood and affect: No depression, anxiety, or agitation     DATA REVIEWED:   Latest Reference Range & Units 12/28/21 15:13  Sodium 135 - 145 mEq/L 135  Potassium 3.5 - 5.1 mEq/L 4.6  Chloride 96 - 112 mEq/L 101  CO2 19 - 32 mEq/L 26  Glucose 70 - 99 mg/dL 88  BUN 6 - 23 mg/dL 15  Creatinine 0.40 - 1.20 mg/dL 0.77  Calcium 8.4 - 10.5 mg/dL 9.9  Alkaline Phosphatase 39 - 117 U/L 41  Albumin 3.5 - 5.2 g/dL 4.6  AST 0 - 37 U/L 20  ALT 0 - 35 U/L 21  Total Protein 6.0 - 8.3 g/dL 7.4  Bilirubin, Direct 0.0 - 0.3 mg/dL 0.2  Total Bilirubin 0.2 - 1.2 mg/dL 0.8  GFR >60.00 mL/min 74.79    Latest Reference Range & Units 12/28/21 15:13  TSH 0.35 - 5.50 uIU/mL 1.18      DXA 11/06/2020     Lumbar spine L1-L4 Femoral neck (FN) 33% distal radius  T-score 0.1 RFN: -1.9 LFN: -2.0 n/a  Change in  BMD from previous DXA test (%) Down 1.4% Down 14.0% n/a  (*) statistically significant   FRAX score: 10 year major osteoporotic risk: 19.3%. 10 year hip fracture risk: 4.6%. The thresholds for treatment are 20% and 3%,  respectively.  ASSESSMENT/PLAN/RECOMMENDATIONS:   Low Bone density :  - She meets criteria for anti-resorptive therapy with a FRAX of 4.6 % at the hips  , she used to be on Alendronate without reported intolerance, she has been off for years  on her last visit with me she opted to avoid ant-resoprtive therapy and focus on calcium/vitamin  D and weight bearing exercise but unfortunately she has not been exercising on regular basis, she recently bought a book and weights and I have encouraged her to start weight bearing exercise    Medications : Calcium 1200 mg a day Vitamin D 3 1000 IU daily Weightbearing exercise    2. Hypothyroidism:  -Patient is clinically euthyroid -TSH improved from  5.35 you IU/mL to 1.18 uIU/mL  - NO change    Medication  Continue levothyroxine 75 MCG , 2 tabs on Sundays and 1 tabs rest of the week    F/U in 1 yr   Signed electronically by: Mack Guise, MD  [facility]  [facility] [address] [place], [place] [address] Phone: [phone] FAX: [phone]   CC: Burnis Medin, [place] [address] Phone: [phone] Fax: [phone]   Return to Endocrinology clinic as below: Future Appointments  Date Time Provider [place]  07/29/2022  4:20 PM Lelon Perla, MD [facility] None  02/15/2023 10:45 AM Rankin, Clent Demark, MD [facility] None

## 2022-03-15 NOTE — Patient Instructions (Signed)
Calcium 1200 mg daily  Vitamin D 1000 iu daily   Weight bearing Exercise

## 2022-03-27 ENCOUNTER — Other Ambulatory Visit (HOSPITAL_BASED_OUTPATIENT_CLINIC_OR_DEPARTMENT_OTHER): Payer: Self-pay

## 2022-04-13 ENCOUNTER — Other Ambulatory Visit (HOSPITAL_BASED_OUTPATIENT_CLINIC_OR_DEPARTMENT_OTHER): Payer: Self-pay

## 2022-04-14 ENCOUNTER — Other Ambulatory Visit (HOSPITAL_BASED_OUTPATIENT_CLINIC_OR_DEPARTMENT_OTHER): Payer: Self-pay

## 2022-04-19 DIAGNOSIS — H16201 Unspecified keratoconjunctivitis, right eye: Secondary | ICD-10-CM | POA: Diagnosis not present

## 2022-05-03 ENCOUNTER — Other Ambulatory Visit (HOSPITAL_BASED_OUTPATIENT_CLINIC_OR_DEPARTMENT_OTHER): Payer: Self-pay

## 2022-05-03 DIAGNOSIS — M7541 Impingement syndrome of right shoulder: Secondary | ICD-10-CM | POA: Diagnosis not present

## 2022-05-03 MED ORDER — MELOXICAM 15 MG PO TABS
15.0000 mg | ORAL_TABLET | Freq: Every day | ORAL | 1 refills | Status: DC
  Filled 2022-05-03: qty 30, 30d supply, fill #0
  Filled 2022-05-26 – 2022-07-16 (×4): qty 30, 30d supply, fill #1

## 2022-05-06 DIAGNOSIS — M25611 Stiffness of right shoulder, not elsewhere classified: Secondary | ICD-10-CM | POA: Diagnosis not present

## 2022-05-06 DIAGNOSIS — M6281 Muscle weakness (generalized): Secondary | ICD-10-CM | POA: Diagnosis not present

## 2022-05-06 DIAGNOSIS — M7541 Impingement syndrome of right shoulder: Secondary | ICD-10-CM | POA: Diagnosis not present

## 2022-05-10 ENCOUNTER — Other Ambulatory Visit: Payer: Self-pay | Admitting: Internal Medicine

## 2022-05-18 DIAGNOSIS — M7541 Impingement syndrome of right shoulder: Secondary | ICD-10-CM | POA: Diagnosis not present

## 2022-05-18 DIAGNOSIS — M6281 Muscle weakness (generalized): Secondary | ICD-10-CM | POA: Diagnosis not present

## 2022-05-18 DIAGNOSIS — M25611 Stiffness of right shoulder, not elsewhere classified: Secondary | ICD-10-CM | POA: Diagnosis not present

## 2022-05-20 DIAGNOSIS — M6281 Muscle weakness (generalized): Secondary | ICD-10-CM | POA: Diagnosis not present

## 2022-05-20 DIAGNOSIS — M7541 Impingement syndrome of right shoulder: Secondary | ICD-10-CM | POA: Diagnosis not present

## 2022-05-20 DIAGNOSIS — M25611 Stiffness of right shoulder, not elsewhere classified: Secondary | ICD-10-CM | POA: Diagnosis not present

## 2022-05-24 ENCOUNTER — Other Ambulatory Visit (HOSPITAL_BASED_OUTPATIENT_CLINIC_OR_DEPARTMENT_OTHER): Payer: Self-pay

## 2022-05-24 DIAGNOSIS — M7541 Impingement syndrome of right shoulder: Secondary | ICD-10-CM | POA: Diagnosis not present

## 2022-05-24 DIAGNOSIS — M25611 Stiffness of right shoulder, not elsewhere classified: Secondary | ICD-10-CM | POA: Diagnosis not present

## 2022-05-24 DIAGNOSIS — M6281 Muscle weakness (generalized): Secondary | ICD-10-CM | POA: Diagnosis not present

## 2022-05-25 ENCOUNTER — Other Ambulatory Visit (HOSPITAL_BASED_OUTPATIENT_CLINIC_OR_DEPARTMENT_OTHER): Payer: Self-pay

## 2022-05-26 ENCOUNTER — Other Ambulatory Visit (HOSPITAL_BASED_OUTPATIENT_CLINIC_OR_DEPARTMENT_OTHER): Payer: Self-pay

## 2022-05-26 ENCOUNTER — Other Ambulatory Visit: Payer: Self-pay

## 2022-05-27 ENCOUNTER — Other Ambulatory Visit (HOSPITAL_BASED_OUTPATIENT_CLINIC_OR_DEPARTMENT_OTHER): Payer: Self-pay

## 2022-05-27 ENCOUNTER — Other Ambulatory Visit: Payer: Self-pay

## 2022-05-27 DIAGNOSIS — M7541 Impingement syndrome of right shoulder: Secondary | ICD-10-CM | POA: Diagnosis not present

## 2022-05-27 DIAGNOSIS — M25611 Stiffness of right shoulder, not elsewhere classified: Secondary | ICD-10-CM | POA: Diagnosis not present

## 2022-05-27 DIAGNOSIS — M6281 Muscle weakness (generalized): Secondary | ICD-10-CM | POA: Diagnosis not present

## 2022-05-31 DIAGNOSIS — M7541 Impingement syndrome of right shoulder: Secondary | ICD-10-CM | POA: Diagnosis not present

## 2022-05-31 DIAGNOSIS — M6281 Muscle weakness (generalized): Secondary | ICD-10-CM | POA: Diagnosis not present

## 2022-05-31 DIAGNOSIS — M25611 Stiffness of right shoulder, not elsewhere classified: Secondary | ICD-10-CM | POA: Diagnosis not present

## 2022-06-03 DIAGNOSIS — M25611 Stiffness of right shoulder, not elsewhere classified: Secondary | ICD-10-CM | POA: Diagnosis not present

## 2022-06-03 DIAGNOSIS — M6281 Muscle weakness (generalized): Secondary | ICD-10-CM | POA: Diagnosis not present

## 2022-06-03 DIAGNOSIS — M7541 Impingement syndrome of right shoulder: Secondary | ICD-10-CM | POA: Diagnosis not present

## 2022-06-04 DIAGNOSIS — M6281 Muscle weakness (generalized): Secondary | ICD-10-CM | POA: Diagnosis not present

## 2022-06-04 DIAGNOSIS — M7541 Impingement syndrome of right shoulder: Secondary | ICD-10-CM | POA: Diagnosis not present

## 2022-06-09 DIAGNOSIS — M25511 Pain in right shoulder: Secondary | ICD-10-CM | POA: Diagnosis not present

## 2022-06-14 DIAGNOSIS — M7501 Adhesive capsulitis of right shoulder: Secondary | ICD-10-CM | POA: Diagnosis not present

## 2022-07-12 DIAGNOSIS — J320 Chronic maxillary sinusitis: Secondary | ICD-10-CM | POA: Diagnosis not present

## 2022-07-12 DIAGNOSIS — J3089 Other allergic rhinitis: Secondary | ICD-10-CM | POA: Diagnosis not present

## 2022-07-14 ENCOUNTER — Other Ambulatory Visit (HOSPITAL_BASED_OUTPATIENT_CLINIC_OR_DEPARTMENT_OTHER): Payer: Self-pay

## 2022-07-14 MED ORDER — SULFAMETHOXAZOLE-TRIMETHOPRIM 800-160 MG PO TABS
1.0000 | ORAL_TABLET | Freq: Two times a day (BID) | ORAL | 0 refills | Status: DC
  Filled 2022-07-14: qty 42, 21d supply, fill #0

## 2022-07-16 ENCOUNTER — Other Ambulatory Visit (HOSPITAL_BASED_OUTPATIENT_CLINIC_OR_DEPARTMENT_OTHER): Payer: Self-pay

## 2022-07-22 NOTE — Progress Notes (Signed)
HPI: FU CAD. Echocardiogram February 2015 showed normal LV function and mild right ventricular enlargement.  Calcium score July 2022 234 which was 74th percentile.  Since last seen she denies dyspnea, chest pain, palpitations or syncope.  Current Outpatient Medications  Medication Sig Dispense Refill   albuterol (VENTOLIN HFA) 108 (90 Base) MCG/ACT inhaler Inhale 2 puffs into the lungs every 6 (six) hours as needed for wheezing or shortness of breath.     CALCIUM PO Take 3 tablets by mouth daily. Calcium Bone Maker     carvedilol (COREG) 6.25 MG tablet Take 0.5 tablets (3.125 mg total) by mouth 2 (two) times daily with a meal. Dosage change 90 tablet 3   Cholecalciferol (VITAMIN D) 50 MCG (2000 UT) CAPS Take 4,000 Units by mouth daily.     Evolocumab (REPATHA SURECLICK) [id number] MG/ML SOAJ Inject 1 mL into the skin every 14 (fourteen) days. 6 mL 3   fluticasone (FLONASE) 50 MCG/ACT nasal spray Place 2 sprays into both nostrils daily. 16 g 11   fluticasone-salmeterol (ADVAIR) 250-50 MCG/ACT AEPB Inhale 1 puff into the lungs in the morning and at bedtime. 180 each 3   latanoprost (XALATAN) 0.005 % ophthalmic solution Instill 1 drop into each eye each everning 2.5 mL 1   levothyroxine (SYNTHROID) 75 MCG tablet TAKE 1 TABLET MONDAY THROUGH SATURDAY AND TAKE 2 TABLETS ON SUNDAYS AS DIRECTED 104 tablet 3   MAGNESIUM GLUCONATE PO Take 800 mg by mouth 2 (two) times daily. 400 mg each     meloxicam (MOBIC) 15 MG tablet Take 1 tablet by mouth once a day with meals for two weeks then as needed. 30 tablet 1   montelukast (SINGULAIR) 10 MG tablet TAKE 1 TABLET AT BEDTIME. SCHEDULE AN APPOINTMENT FOR FUTURE REFILLS 90 tablet 3   mupirocin ointment (BACTROBAN) 2 % Add 1 inch strip to nasal saline rinse and perform rinse with ointment twice daily 22 g 0   mupirocin ointment (BACTROBAN) 2 % Add 1 inch strip to nasal saline rinse and perform rinse with ointment twice daily 22 g 5   predniSONE (DELTASONE) 10 MG  tablet Take 4 tablets (40 mg total) by mouth daily for 3 days, THEN 2 tablets (20 mg total) daily for 2 days, THEN 1 tablet (10 mg total) daily for 1 day. 17 tablet 0   sulfamethoxazole-trimethoprim (BACTRIM DS) 800-160 MG tablet Take 1 tablet by mouth 2 times daily for 10 days. 20 tablet 0   sulfamethoxazole-trimethoprim (BACTRIM DS) 800-160 MG tablet Take 1 tablet by mouth 2 (two) times daily for 21 days. 42 tablet 0   Tdap (BOOSTRIX) 5-2.5-18.5 LF-MCG/0.5 injection Inject into the muscle. 0.5 mL 0   telmisartan (MICARDIS) 80 MG tablet TAKE 1 TABLET DAILY 90 tablet 3   timolol (TIMOPTIC) 0.5 % ophthalmic solution Place 1 drop into both eyes 2 (two) times daily.     valACYclovir (VALTREX) 500 MG tablet Take 500 mg by mouth daily.     No current facility-administered medications for this visit.     Past Medical History:  Diagnosis Date   Anemia    pt. denies this dx   Arthritis    Asthma    Closed disp fx of right olecranon with intraartic exten w nonunion 04/19/2014   Colon polyps    Complication of anesthesia    2 days after surgery-muscles hurtand spasmed-needed a muscle relaxant    Diverticulosis    Dysrhythmia    Fracture of right olecranon process 10/26/2013  GERD (gastroesophageal reflux disease)    hx - diet controlled, no longer an issue   History of cold sores    Hyperlipidemia    had been on lipitor changes to pravachol cause of cost and tricor has since stopped  NO  se reported    Hypertension    Hypothyroidism     on meds for years   Impaired fasting glucose    Olecranon fracture 10/25/2013   right arm   Pneumonia 04/2019   x 1   PONV (postoperative nausea and vomiting)    Ventricular bigeminy seen on cardiac monitor    Cardiac work up Dr Berlinda [facility]   Wears dentures    top-partial bottom   Wears glasses    at night driving and reading    Past Surgical History:  Procedure Laterality Date   BALLOON SINUPLASTY Right 01/06/2022   Procedure: BALLOON  SINUPLASTY;  Surgeon: Jason Coop, DO;  Location: [place];  Service: ENT;  Laterality: Right;   [place] Bilateral    CATARACT EXTRACTION Bilateral 11/2016   CHOLECYSTECTOMY     COLONOSCOPY     ETHMOIDECTOMY Right 01/06/2022   Procedure: TOTAL ETHMOIDECTOMY;  Surgeon: Jason Coop, DO;  Location: [place];  Service: ENT;  Laterality: Right;   FRONTAL SINUS EXPLORATION Right 01/06/2022   Procedure: RIGHT FRONTAL RECESS EXPLORATION;  Surgeon: Jason Coop, DO;  Location: [place];  Service: ENT;  Laterality: Right;   HARVEST BONE GRAFT Right 04/19/2014   Procedure: HARVEST LEFT TIBIA BONE GRAFT ;  Surgeon: Johnny Bridge, MD;  Location: [place];  Service: Orthopedics;  Laterality: Right;   HEMORROIDECTOMY     KNEE ARTHROSCOPY Right    MAXILLARY ANTROSTOMY Right 01/06/2022   Procedure: RIGHT MAXILLARY ANTROSTOMY;  Surgeon: Jason Coop, DO;  Location: [place];  Service: ENT;  Laterality: Right;   NASAL SINUS SURGERY     ORIF ELBOW FRACTURE Right 10/26/2013   Procedure: RIGHT ELBOW FRACTUE OPEN TREATMENT ULNAR PROXIMAL END OLECRANON PROCESS INCLUDES INTERNAL FIXATION;  Surgeon: Johnny Bridge, MD;  Location: [place];  Service: Orthopedics;  Laterality: Right;   ORIF ELBOW FRACTURE Right 04/19/2014   Procedure: OPEN REDUCTION INTERNAL FIXATION (ORIF) RIGHT ELBOW/OLECRANON NONUNION FRACTURE WITH HARDWARE REMOVAL;  Surgeon: Johnny Bridge, MD;  Location: [place];  Service: Orthopedics;  Laterality: Right;   SINUS ENDO WITH FUSION Right 01/06/2022   Procedure: RIGHT FUNCTIONAL ENDOSCOPIC SINUS SURGERY;  Surgeon: Jason Coop, DO;  [facility];  Service: ENT;  Laterality: Right;   TONSILLECTOMY     TUBAL LIGATION     UPPER GI ENDOSCOPY     uterus repair      Social History   Socioeconomic History   Marital status: Married    Spouse name: Not on file   Number of children: 3   Years of education:  Not on file   Highest education level: Not on file  Occupational History   Not on file  Tobacco Use   Smoking status: Former    Types: Cigarettes    Quit date: 10/25/1973    Years since quitting: 48.7   Smokeless tobacco: Never  Vaping Use   Vaping Use: Never used  Substance and Sexual Activity   Alcohol use: Yes    Alcohol/week: 7.0 standard drinks of alcohol    Types: 7 Glasses of wine per week   Drug use: No   Sexual activity: Yes  Birth control/protection: Post-menopausal  Other Topics Concern   Not on file  Social History Narrative   hh of 2    Married     Husband is Still in [place]  Retired  Probation officer school bus.    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    Gravida 3 para 3   Last td 6 2008   HS educated       REmote hx of tobacco 1.5 ppd for 6 years as a teen Q 35   Social Determinants of Radio broadcast assistant Strain: Not on file  Food Insecurity: Not on file  Transportation Needs: Not on file  Physical Activity: Not on file  Stress: Not on file  Social Connections: Not on file  Intimate Partner Violence: Not on file    Family History  Problem Relation Age of Onset   Stroke Mother    Diabetes Father    Heart disease Father    Alzheimer's disease Other    Stomach cancer Maternal Aunt    Colon cancer Neg Hx    Pancreatic cancer Neg Hx     ROS: no fevers or chills, productive cough, hemoptysis, dysphasia, odynophagia, melena, hematochezia, dysuria, hematuria, rash, seizure activity, orthopnea, PND, pedal edema, claudication. Remaining systems are negative.  Physical Exam: Well-developed well-nourished in no acute distress.  Skin is warm and dry.  HEENT is normal.  Neck is supple.  Chest is clear to auscultation with normal expansion.  Cardiovascular exam is regular rate and rhythm.  Abdominal exam nontender or distended. No masses palpated. Extremities show no edema. neuro grossly intact  ECG-sinus  bradycardia at a rate of 53, no ST changes.  Personally reviewed  A/P  1 coronary artery disease-patient denies chest pain.  Diagnosis is based on previous elevation in calcium score.  Continue aspirin.  She is intolerant to statins.  2 hyperlipidemia-intolerant to statins.  Continue Repatha.  3 hypertension-blood pressure controlled.  Continue present medical regimen.  Kirk Ruths, MD

## 2022-07-29 ENCOUNTER — Encounter: Payer: Self-pay | Admitting: Cardiology

## 2022-07-29 ENCOUNTER — Ambulatory Visit: Payer: Medicare Other | Attending: Cardiology | Admitting: Cardiology

## 2022-07-29 VITALS — BP 138/68 | HR 53 | Ht 63.0 in | Wt 179.0 lb

## 2022-07-29 DIAGNOSIS — E785 Hyperlipidemia, unspecified: Secondary | ICD-10-CM | POA: Insufficient documentation

## 2022-07-29 DIAGNOSIS — R931 Abnormal findings on diagnostic imaging of heart and coronary circulation: Secondary | ICD-10-CM | POA: Diagnosis not present

## 2022-07-29 DIAGNOSIS — I10 Essential (primary) hypertension: Secondary | ICD-10-CM | POA: Diagnosis not present

## 2022-07-29 NOTE — Patient Instructions (Signed)
  Follow-Up: At [facility], you and your health needs are our priority.  As part of our continuing mission to provide you with exceptional heart care, we have created designated Provider Care Teams.  These Care Teams include your primary Cardiologist (physician) and Advanced Practice Providers (APPs -  Physician Assistants and Nurse Practitioners) who all work together to provide you with the care you need, when you need it.  We recommend signing up for the patient portal called "MyChart".  Sign up information is provided on this After Visit Summary.  MyChart is used to connect with patients for Virtual Visits (Telemedicine).  Patients are able to view lab/test results, encounter notes, upcoming appointments, etc.  Non-urgent messages can be sent to your provider as well.   To learn more about what you can do with MyChart, go to [email or web address].    Your next appointment:   12 month(s)  Provider:   Brian Crenshaw MD    

## 2022-08-03 ENCOUNTER — Other Ambulatory Visit (HOSPITAL_BASED_OUTPATIENT_CLINIC_OR_DEPARTMENT_OTHER): Payer: Self-pay

## 2022-08-04 ENCOUNTER — Other Ambulatory Visit (HOSPITAL_BASED_OUTPATIENT_CLINIC_OR_DEPARTMENT_OTHER): Payer: Self-pay

## 2022-08-05 DIAGNOSIS — H04123 Dry eye syndrome of bilateral lacrimal glands: Secondary | ICD-10-CM | POA: Diagnosis not present

## 2022-08-05 DIAGNOSIS — Z961 Presence of intraocular lens: Secondary | ICD-10-CM | POA: Diagnosis not present

## 2022-08-05 DIAGNOSIS — H401131 Primary open-angle glaucoma, bilateral, mild stage: Secondary | ICD-10-CM | POA: Diagnosis not present

## 2022-09-06 ENCOUNTER — Other Ambulatory Visit (HOSPITAL_BASED_OUTPATIENT_CLINIC_OR_DEPARTMENT_OTHER): Payer: Self-pay

## 2022-09-06 ENCOUNTER — Encounter: Payer: Self-pay | Admitting: Family Medicine

## 2022-09-06 ENCOUNTER — Ambulatory Visit (INDEPENDENT_AMBULATORY_CARE_PROVIDER_SITE_OTHER): Payer: Medicare Other | Admitting: Family Medicine

## 2022-09-06 VITALS — BP 128/74 | HR 62 | Temp 98.4°F | Wt 179.6 lb

## 2022-09-06 DIAGNOSIS — R0989 Other specified symptoms and signs involving the circulatory and respiratory systems: Secondary | ICD-10-CM | POA: Diagnosis not present

## 2022-09-06 DIAGNOSIS — J029 Acute pharyngitis, unspecified: Secondary | ICD-10-CM | POA: Diagnosis not present

## 2022-09-06 DIAGNOSIS — J020 Streptococcal pharyngitis: Secondary | ICD-10-CM

## 2022-09-06 LAB — POCT RAPID STREP A (OFFICE): Rapid Strep A Screen: POSITIVE — AB

## 2022-09-06 LAB — POC COVID19 BINAXNOW: SARS Coronavirus 2 Ag: NEGATIVE

## 2022-09-06 MED ORDER — CEFUROXIME AXETIL 500 MG PO TABS
500.0000 mg | ORAL_TABLET | Freq: Two times a day (BID) | ORAL | 0 refills | Status: AC
  Filled 2022-09-06: qty 20, 10d supply, fill #0

## 2022-09-06 NOTE — Progress Notes (Signed)
   Subjective:    Patient ID: Marie Kelly, female    DOB: 01-Jan-1946, 77 y.o.   MRN: [id number]  HPI Here for 4 days of stuffy head, PND, ST, and a dry cough. No fever.    Review of Systems  Constitutional: Negative.   HENT:  Positive for congestion, ear pain, postnasal drip and sore throat.   Eyes: Negative.   Respiratory:  Positive for cough. Negative for shortness of breath and wheezing.        Objective:   Physical Exam Constitutional:      Appearance: Normal appearance. She is not ill-appearing.  HENT:     Right Ear: Tympanic membrane, ear canal and external ear normal.     Left Ear: Tympanic membrane, ear canal and external ear normal.     Nose: Nose normal.     Mouth/Throat:     Pharynx: Oropharynx is clear.  Eyes:     Conjunctiva/sclera: Conjunctivae normal.  Pulmonary:     Effort: Pulmonary effort is normal.     Breath sounds: Normal breath sounds.  Lymphadenopathy:     Cervical: No cervical adenopathy.  Neurological:     Mental Status: She is alert.           Assessment & Plan:  Strep pharyngitis, treat with 10 days of Cefuroxime.  Gershon Crane, MD

## 2022-09-14 DIAGNOSIS — J320 Chronic maxillary sinusitis: Secondary | ICD-10-CM | POA: Diagnosis not present

## 2022-09-14 DIAGNOSIS — R051 Acute cough: Secondary | ICD-10-CM | POA: Diagnosis not present

## 2022-09-14 DIAGNOSIS — J309 Allergic rhinitis, unspecified: Secondary | ICD-10-CM | POA: Diagnosis not present

## 2022-09-20 ENCOUNTER — Encounter: Payer: Self-pay | Admitting: Internal Medicine

## 2022-09-21 ENCOUNTER — Other Ambulatory Visit: Payer: Self-pay | Admitting: Internal Medicine

## 2022-09-21 ENCOUNTER — Other Ambulatory Visit: Payer: Self-pay | Admitting: Family

## 2022-09-21 MED ORDER — CARVEDILOL 6.25 MG PO TABS: 3.1250 mg | ORAL_TABLET | Freq: Two times a day (BID) | ORAL | 3 refills | Status: DC

## 2022-09-23 ENCOUNTER — Other Ambulatory Visit (HOSPITAL_BASED_OUTPATIENT_CLINIC_OR_DEPARTMENT_OTHER): Payer: Self-pay

## 2022-09-23 DIAGNOSIS — H16201 Unspecified keratoconjunctivitis, right eye: Secondary | ICD-10-CM | POA: Diagnosis not present

## 2022-09-23 MED ORDER — FLUOROMETHOLONE 0.1 % OP SUSP
1.0000 [drp] | Freq: Two times a day (BID) | OPHTHALMIC | 0 refills | Status: AC
  Filled 2022-09-23: qty 5, 30d supply, fill #0

## 2022-10-05 ENCOUNTER — Other Ambulatory Visit: Payer: Self-pay | Admitting: Internal Medicine

## 2022-10-25 ENCOUNTER — Other Ambulatory Visit (HOSPITAL_BASED_OUTPATIENT_CLINIC_OR_DEPARTMENT_OTHER): Payer: Self-pay

## 2022-10-25 DIAGNOSIS — H0100B Unspecified blepharitis left eye, upper and lower eyelids: Secondary | ICD-10-CM | POA: Diagnosis not present

## 2022-10-25 DIAGNOSIS — H16203 Unspecified keratoconjunctivitis, bilateral: Secondary | ICD-10-CM | POA: Diagnosis not present

## 2022-10-25 DIAGNOSIS — H0100A Unspecified blepharitis right eye, upper and lower eyelids: Secondary | ICD-10-CM | POA: Diagnosis not present

## 2022-10-25 MED ORDER — AZITHROMYCIN 250 MG PO TABS
ORAL_TABLET | ORAL | 3 refills | Status: DC
  Filled 2022-10-25: qty 6, 5d supply, fill #0
  Filled 2022-10-26: qty 6, 5d supply, fill #1

## 2022-10-25 MED ORDER — NEOMYCIN-POLYMYXIN-DEXAMETH 3.5-10000-0.1 OP SUSP
1.0000 [drp] | Freq: Two times a day (BID) | OPHTHALMIC | 0 refills | Status: AC
  Filled 2022-10-25: qty 5, 50d supply, fill #0

## 2022-10-25 MED ORDER — NEOMYCIN-POLYMYXIN-DEXAMETH 3.5-10000-0.1 OP OINT
TOPICAL_OINTMENT | OPHTHALMIC | 0 refills | Status: DC
  Filled 2022-10-25: qty 3.5, 21d supply, fill #0

## 2022-10-26 ENCOUNTER — Other Ambulatory Visit (HOSPITAL_BASED_OUTPATIENT_CLINIC_OR_DEPARTMENT_OTHER): Payer: Self-pay

## 2022-10-26 ENCOUNTER — Other Ambulatory Visit: Payer: Self-pay

## 2022-11-08 DIAGNOSIS — H10503 Unspecified blepharoconjunctivitis, bilateral: Secondary | ICD-10-CM | POA: Diagnosis not present

## 2022-11-08 DIAGNOSIS — H04123 Dry eye syndrome of bilateral lacrimal glands: Secondary | ICD-10-CM | POA: Diagnosis not present

## 2022-11-22 ENCOUNTER — Other Ambulatory Visit: Payer: Self-pay | Admitting: Internal Medicine

## 2022-11-22 DIAGNOSIS — Z1231 Encounter for screening mammogram for malignant neoplasm of breast: Secondary | ICD-10-CM

## 2022-11-26 ENCOUNTER — Ambulatory Visit
Admission: RE | Admit: 2022-11-26 | Discharge: 2022-11-26 | Disposition: A | Discharge status: Home / Self Care | Payer: Medicare Other | Source: Ambulatory Visit | Attending: Internal Medicine | Admitting: Internal Medicine

## 2022-11-26 DIAGNOSIS — Z1231 Encounter for screening mammogram for malignant neoplasm of breast: Secondary | ICD-10-CM | POA: Diagnosis not present

## 2022-11-29 ENCOUNTER — Other Ambulatory Visit: Payer: Self-pay

## 2022-11-29 ENCOUNTER — Other Ambulatory Visit (HOSPITAL_BASED_OUTPATIENT_CLINIC_OR_DEPARTMENT_OTHER): Payer: Self-pay

## 2023-01-24 ENCOUNTER — Other Ambulatory Visit (HOSPITAL_BASED_OUTPATIENT_CLINIC_OR_DEPARTMENT_OTHER): Payer: Self-pay

## 2023-01-24 DIAGNOSIS — Z09 Encounter for follow-up examination after completed treatment for conditions other than malignant neoplasm: Secondary | ICD-10-CM | POA: Diagnosis not present

## 2023-01-24 DIAGNOSIS — Z08 Encounter for follow-up examination after completed treatment for malignant neoplasm: Secondary | ICD-10-CM | POA: Diagnosis not present

## 2023-01-24 DIAGNOSIS — Z85828 Personal history of other malignant neoplasm of skin: Secondary | ICD-10-CM | POA: Diagnosis not present

## 2023-01-24 DIAGNOSIS — L814 Other melanin hyperpigmentation: Secondary | ICD-10-CM | POA: Diagnosis not present

## 2023-01-24 DIAGNOSIS — L304 Erythema intertrigo: Secondary | ICD-10-CM | POA: Diagnosis not present

## 2023-01-24 DIAGNOSIS — Z872 Personal history of diseases of the skin and subcutaneous tissue: Secondary | ICD-10-CM | POA: Diagnosis not present

## 2023-01-24 DIAGNOSIS — L821 Other seborrheic keratosis: Secondary | ICD-10-CM | POA: Diagnosis not present

## 2023-01-24 DIAGNOSIS — Z7189 Other specified counseling: Secondary | ICD-10-CM | POA: Diagnosis not present

## 2023-01-24 DIAGNOSIS — D229 Melanocytic nevi, unspecified: Secondary | ICD-10-CM | POA: Diagnosis not present

## 2023-01-24 MED ORDER — KETOCONAZOLE 2 % EX CREA
1.0000 | TOPICAL_CREAM | Freq: Two times a day (BID) | CUTANEOUS | 3 refills | Status: AC
  Filled 2023-01-24: qty 60, 30d supply, fill #0
  Filled 2023-02-16: qty 60, 30d supply, fill #1

## 2023-01-24 MED ORDER — DESONIDE 0.05 % EX CREA
1.0000 | TOPICAL_CREAM | Freq: Two times a day (BID) | CUTANEOUS | 3 refills | Status: AC
  Filled 2023-01-24: qty 60, 30d supply, fill #0
  Filled 2023-02-18: qty 60, 30d supply, fill #1

## 2023-01-26 ENCOUNTER — Other Ambulatory Visit (HOSPITAL_BASED_OUTPATIENT_CLINIC_OR_DEPARTMENT_OTHER): Payer: Self-pay

## 2023-02-03 DIAGNOSIS — H35371 Puckering of macula, right eye: Secondary | ICD-10-CM | POA: Diagnosis not present

## 2023-02-03 DIAGNOSIS — H401131 Primary open-angle glaucoma, bilateral, mild stage: Secondary | ICD-10-CM | POA: Diagnosis not present

## 2023-02-03 DIAGNOSIS — Z961 Presence of intraocular lens: Secondary | ICD-10-CM | POA: Diagnosis not present

## 2023-02-09 ENCOUNTER — Telehealth: Payer: Self-pay

## 2023-02-09 NOTE — Telephone Encounter (Signed)
Ok to send

## 2023-02-09 NOTE — Telephone Encounter (Signed)
Spoke to pt. Pt request a refill on Advair Diskus to be send to [facility] in [place].   Received a fax. Attempted to add the pharmacy in Epic. Unable to locate the pharmacy.   Okay to send?  Place form in provider's red folder.

## 2023-02-09 NOTE — Telephone Encounter (Signed)
Form was faxed. Received a confirmation.

## 2023-02-14 ENCOUNTER — Other Ambulatory Visit: Payer: Self-pay | Admitting: Internal Medicine

## 2023-02-15 ENCOUNTER — Encounter (INDEPENDENT_AMBULATORY_CARE_PROVIDER_SITE_OTHER): Payer: Medicare Other | Admitting: Ophthalmology

## 2023-02-15 DIAGNOSIS — H35431 Paving stone degeneration of retina, right eye: Secondary | ICD-10-CM | POA: Diagnosis not present

## 2023-02-15 DIAGNOSIS — H35371 Puckering of macula, right eye: Secondary | ICD-10-CM | POA: Diagnosis not present

## 2023-02-15 DIAGNOSIS — H33101 Unspecified retinoschisis, right eye: Secondary | ICD-10-CM | POA: Diagnosis not present

## 2023-02-15 DIAGNOSIS — H43813 Vitreous degeneration, bilateral: Secondary | ICD-10-CM | POA: Diagnosis not present

## 2023-02-15 DIAGNOSIS — H353131 Nonexudative age-related macular degeneration, bilateral, early dry stage: Secondary | ICD-10-CM | POA: Diagnosis not present

## 2023-02-16 ENCOUNTER — Other Ambulatory Visit: Payer: Self-pay

## 2023-02-21 DIAGNOSIS — J309 Allergic rhinitis, unspecified: Secondary | ICD-10-CM | POA: Diagnosis not present

## 2023-02-21 DIAGNOSIS — J320 Chronic maxillary sinusitis: Secondary | ICD-10-CM | POA: Diagnosis not present

## 2023-02-23 ENCOUNTER — Telehealth: Payer: Self-pay | Admitting: Cardiology

## 2023-02-23 ENCOUNTER — Other Ambulatory Visit (HOSPITAL_BASED_OUTPATIENT_CLINIC_OR_DEPARTMENT_OTHER): Payer: Self-pay

## 2023-02-23 ENCOUNTER — Other Ambulatory Visit: Payer: Self-pay | Admitting: Cardiology

## 2023-02-23 DIAGNOSIS — E785 Hyperlipidemia, unspecified: Secondary | ICD-10-CM

## 2023-02-23 DIAGNOSIS — R931 Abnormal findings on diagnostic imaging of heart and coronary circulation: Secondary | ICD-10-CM

## 2023-02-23 MED ORDER — REPATHA SURECLICK 140 MG/ML ~~LOC~~ SOAJ
140.0000 mg | SUBCUTANEOUS | 3 refills | Status: DC
Start: 2023-02-23 — End: 2024-02-08
  Filled 2023-02-23: qty 6, 84d supply, fill #0
  Filled 2023-05-17: qty 6, 84d supply, fill #1
  Filled 2023-05-17: qty 2, 28d supply, fill #1
  Filled 2023-05-17 – 2023-05-19 (×2): qty 6, 84d supply, fill #1
  Filled 2023-05-23: qty 2, 28d supply, fill #1
  Filled 2023-05-25 – 2023-05-28 (×3): qty 6, 84d supply, fill #1
  Filled 2023-08-15 – 2023-09-08 (×2): qty 6, 84d supply, fill #2
  Filled 2023-10-31 – 2023-11-29 (×3): qty 6, 84d supply, fill #3

## 2023-02-23 NOTE — Telephone Encounter (Signed)
Rx sent 

## 2023-02-23 NOTE — Telephone Encounter (Signed)
*  STAT* If patient is at the pharmacy, call can be transferred to refill team.   1. Which medications need to be refilled? (please list name of each medication and dose if known)   Evolocumab (REPATHA SURECLICK) 140 MG/ML SOAJ   2. Would you like to learn more about the convenience, safety, & potential cost savings by using the [facility]?   3. Are you open to using the [facility] (Type [facility]. ).  4. Which pharmacy/location (including street and city if local pharmacy) is medication to be sent to?  [facility]   5. Do they need a 30 day or 90 day supply?   90 day  Patient stated she is completely out of this medication and will going out of town.

## 2023-02-24 ENCOUNTER — Other Ambulatory Visit (HOSPITAL_BASED_OUTPATIENT_CLINIC_OR_DEPARTMENT_OTHER): Payer: Self-pay

## 2023-02-24 ENCOUNTER — Telehealth: Payer: Self-pay | Admitting: Pharmacy Technician

## 2023-02-24 ENCOUNTER — Other Ambulatory Visit (HOSPITAL_COMMUNITY): Payer: Self-pay

## 2023-02-24 NOTE — Telephone Encounter (Signed)
Pharmacy Patient Advocate Encounter   Received notification from CoverMyMeds that prior authorization for repatha is required/requested.   Insurance verification completed.   The patient is insured through [facility] ADVANTAGE/RX ADVANCE .   Per test claim: The current 02/24/23 day co-pay is, $24.99 .  No PA needed at this time. This test claim was processed through [facility]- copay amounts may vary at other pharmacies due to pharmacy/plan contracts, or as the patient moves through the different stages of their insurance plan.

## 2023-02-26 ENCOUNTER — Other Ambulatory Visit (HOSPITAL_BASED_OUTPATIENT_CLINIC_OR_DEPARTMENT_OTHER): Payer: Self-pay

## 2023-02-26 ENCOUNTER — Encounter: Payer: Self-pay | Admitting: Cardiology

## 2023-02-27 ENCOUNTER — Other Ambulatory Visit (HOSPITAL_BASED_OUTPATIENT_CLINIC_OR_DEPARTMENT_OTHER): Payer: Self-pay

## 2023-02-28 ENCOUNTER — Other Ambulatory Visit (HOSPITAL_BASED_OUTPATIENT_CLINIC_OR_DEPARTMENT_OTHER): Payer: Self-pay

## 2023-02-28 MED ORDER — NEOMYCIN-POLYMYXIN-DEXAMETH 0.1 % OP OINT
1.0000 | TOPICAL_OINTMENT | Freq: Every day | OPHTHALMIC | 1 refills | Status: DC
  Filled 2023-02-28: qty 3.5, 3d supply, fill #0
  Filled 2023-03-01: qty 3.5, 3d supply, fill #1

## 2023-03-01 ENCOUNTER — Other Ambulatory Visit (HOSPITAL_COMMUNITY): Payer: Self-pay

## 2023-03-01 ENCOUNTER — Other Ambulatory Visit (HOSPITAL_BASED_OUTPATIENT_CLINIC_OR_DEPARTMENT_OTHER): Payer: Self-pay

## 2023-03-15 ENCOUNTER — Ambulatory Visit: Payer: Medicare Other | Admitting: Internal Medicine

## 2023-04-07 ENCOUNTER — Encounter: Payer: Self-pay | Admitting: Internal Medicine

## 2023-04-07 ENCOUNTER — Ambulatory Visit (INDEPENDENT_AMBULATORY_CARE_PROVIDER_SITE_OTHER): Payer: Medicare Other | Admitting: Internal Medicine

## 2023-04-07 VITALS — BP 120/78 | HR 62 | Ht 63.0 in | Wt 186.4 lb

## 2023-04-07 DIAGNOSIS — M8580 Other specified disorders of bone density and structure, unspecified site: Secondary | ICD-10-CM | POA: Diagnosis not present

## 2023-04-07 DIAGNOSIS — M859 Disorder of bone density and structure, unspecified: Secondary | ICD-10-CM | POA: Diagnosis not present

## 2023-04-07 DIAGNOSIS — E039 Hypothyroidism, unspecified: Secondary | ICD-10-CM | POA: Diagnosis not present

## 2023-04-07 DIAGNOSIS — E2839 Other primary ovarian failure: Secondary | ICD-10-CM

## 2023-04-07 LAB — T4, FREE: Free T4: 1.06 ng/dL (ref 0.60–1.60)

## 2023-04-07 LAB — BASIC METABOLIC PANEL
BUN: 17 mg/dL (ref 6–23)
CO2: 27 meq/L (ref 19–32)
Calcium: 9.9 mg/dL (ref 8.4–10.5)
Chloride: 101 meq/L (ref 96–112)
Creatinine, Ser: 0.82 mg/dL (ref 0.40–1.20)
GFR: 68.74 mL/min (ref >60.00)
Glucose, Bld: 110 mg/dL — ABNORMAL HIGH (ref 70–99)
Potassium: 4.6 meq/L (ref 3.5–5.1)
Sodium: 135 meq/L (ref 135–145)

## 2023-04-07 LAB — TSH: TSH: 2.79 u[IU]/mL (ref 0.35–5.50)

## 2023-04-07 NOTE — Progress Notes (Signed)
Name: Marie Kelly  MRN/ DOB: [id number], 1945/08/30    Age/ Sex: 77 y.o., female    PCP: Madelin Headings, MD   Reason for Endocrinology Evaluation: Low Bone density      Date of Initial Endocrinology Evaluation: 03/09/2021    HPI: Marie Kelly is a 77 y.o. female with a past medical history of low bone density, Hypothyroidism, and Asthma . The patient presented for initial endocrinology clinic visit on 03/09/2021 for consultative assistance with her Low bone density .   Pt was diagnosed with low bone density : in 09/2014 with a right femoral neck T-Score -1.2   Menarche at age : 66 Menopausal at age : late 50's  Fracture Hx: elbow fracture - fell down the steps  Hx of HRT: no FH of osteoporosis or hip fracture: mother  Prior Hx of anti-estrogenic therapy :no  Prior Hx of anti-resorptive therapy : Fosamax - long time ago    On her initial visit to our clinic she had a TSH of 5.35 uIu/mL , we increased levothyroxine dose  The patient declined antiresorptive therapy on her initial visit with me and opted to continue with lifestyle changes    SUBJECTIVE:    Today (04/07/23):  Marie Kelly is here for follow-up on low bone density and hypothyroidism.   Weight has been fluctuating  Denies local neck swelling  Denies palpitations  Denies constipation or diarrhea  Denies tremors    Calcium/Mg 3 tablets  Vitamin D 1000 iu daily   Levothyroxine 75 mcg, 2 tabs on Sundays and 1 tab rest of the week    HISTORY:  Past Medical History:  Past Medical History:  Diagnosis Date   Anemia    pt. denies this dx   Arthritis    Asthma    Closed disp fx of right olecranon with intraartic exten w nonunion 04/19/2014   Colon polyps    Complication of anesthesia    2 days after surgery-muscles hurtand spasmed-needed a muscle relaxant    Diverticulosis    Dysrhythmia    Fracture of right olecranon process 10/26/2013   GERD (gastroesophageal reflux disease)    hx  - diet controlled, no longer an issue   History of cold sores    Hyperlipidemia    had been on lipitor changes to pravachol cause of cost and tricor has since stopped  NO  se reported    Hypertension    Hypothyroidism     on meds for years   Impaired fasting glucose    Olecranon fracture 10/25/2013   right arm   Pneumonia 04/2019   x 1   PONV (postoperative nausea and vomiting)    Ventricular bigeminy seen on cardiac monitor    Cardiac work up Dr [facility]   Wears dentures    top-partial bottom   Wears glasses    at night driving and reading   Past Surgical History:  Past Surgical History:  Procedure Laterality Date   BALLOON SINUPLASTY Right 01/06/2022   Procedure: BALLOON SINUPLASTY;  Surgeon: Laren Boom, DO;  [facility];  Service: ENT;  Laterality: Right;   BELPHAROPTOSIS REPAIR Bilateral    CATARACT EXTRACTION Bilateral 11/2016   CHOLECYSTECTOMY     COLONOSCOPY     ETHMOIDECTOMY Right 01/06/2022   Procedure: TOTAL ETHMOIDECTOMY;  Surgeon: Laren Boom, DO;  [facility];  Service: ENT;  Laterality: Right;   FRONTAL SINUS EXPLORATION Right 01/06/2022   Procedure:  RIGHT FRONTAL RECESS EXPLORATION;  Surgeon: Laren Boom, DO;  [facility];  Service: ENT;  Laterality: Right;   HARVEST BONE GRAFT Right 04/19/2014   Procedure: HARVEST LEFT TIBIA BONE GRAFT ;  Surgeon: Eulas Post, MD;  Location: [facility];  Service: Orthopedics;  Laterality: Right;   HEMORROIDECTOMY     KNEE ARTHROSCOPY Right    MAXILLARY ANTROSTOMY Right 01/06/2022   Procedure: RIGHT MAXILLARY ANTROSTOMY;  Surgeon: Laren Boom, DO;  [facility];  Service: ENT;  Laterality: Right;   NASAL SINUS SURGERY     ORIF ELBOW FRACTURE Right 10/26/2013   Procedure: RIGHT ELBOW FRACTUE OPEN TREATMENT ULNAR PROXIMAL END OLECRANON PROCESS INCLUDES INTERNAL FIXATION;  Surgeon: Eulas Post, MD;  Location: [facility];  Service:  Orthopedics;  Laterality: Right;   ORIF ELBOW FRACTURE Right 04/19/2014   Procedure: OPEN REDUCTION INTERNAL FIXATION (ORIF) RIGHT ELBOW/OLECRANON NONUNION FRACTURE WITH HARDWARE REMOVAL;  Surgeon: Eulas Post, MD;  Location: [facility];  Service: Orthopedics;  Laterality: Right;   SINUS ENDO WITH FUSION Right 01/06/2022   Procedure: RIGHT FUNCTIONAL ENDOSCOPIC SINUS SURGERY;  Surgeon: Laren Boom, DO;  [facility];  Service: ENT;  Laterality: Right;   TONSILLECTOMY     TUBAL LIGATION     UPPER GI ENDOSCOPY     uterus repair      Social History:  reports that she quit smoking about 49 years ago. Her smoking use included cigarettes. She has never used smokeless tobacco. She reports current alcohol use of about 7.0 standard drinks of alcohol per week. She reports that she does not use drugs. Family History: family history includes Alzheimer's disease in an other family member; Diabetes in her father; Heart disease in her father; Stomach cancer in her maternal aunt; Stroke in her mother.   HOME MEDICATIONS: Allergies as of 04/07/2023       Reactions   Crestor [rosuvastatin]    myalgias   Hydrochlorothiazide    Muscles tightness and felt like she was hit by a truck   Lipitor [atorvastatin]    Myalgias        Medication List        Accurate as of April 07, 2023 10:12 AM. If you have any questions, ask your nurse or doctor.          STOP taking these medications    albuterol 108 (90 Base) MCG/ACT inhaler Commonly known as: VENTOLIN HFA Stopped by: Johnney Ou Eadie Repetto   azithromycin 250 MG tablet Commonly known as: ZITHROMAX Stopped by: Johnney Ou Gola Bribiesca   Boostrix 5-2.5-18.5 LF-MCG/0.5 injection Generic drug: Tdap Stopped by: Johnney Ou Sarenity Ramaker       TAKE these medications    CALCIUM PO Take 3 tablets by mouth daily. Calcium Bone Maker   carvedilol 6.25 MG tablet Commonly known as: COREG TAKE 1 TABLET IN THE MORNING AND 2  TABLETS IN THE EVENING OR AS DIRECTED   desonide 0.05 % cream Commonly known as: DESOWEN Apply 1 Application topically 2 (two) times daily for 1-2 weeks as needed for flares.   fluticasone 50 MCG/ACT nasal spray Commonly known as: FLONASE Place 2 sprays into both nostrils daily.   fluticasone-salmeterol 250-50 MCG/ACT Aepb Commonly known as: ADVAIR Inhale 1 puff into the lungs in the morning and at bedtime.   ketoconazole 2 % cream Commonly known as: NIZORAL Apply to affected area twice daily for 1-2 weeks when flaring, can mix with Desonide cream  latanoprost 0.005 % ophthalmic solution Commonly known as: XALATAN Instill 1 drop into each eye each everning   levothyroxine 75 MCG tablet Commonly known as: SYNTHROID TAKE 1 TABLET MONDAY THROUGH SATURDAY AND TAKE 2 TABLETS ON SUNDAYS AS DIRECTED   MAGNESIUM GLUCONATE PO Take 800 mg by mouth 2 (two) times daily. 400 mg each   montelukast 10 MG tablet Commonly known as: SINGULAIR TAKE 1 TABLET AT BEDTIME. SCHEDULE AN APPOINTMENT FOR FUTURE REFILLS   mupirocin ointment 2 % Commonly known as: BACTROBAN Add 1 inch strip to nasal saline rinse and perform rinse with ointment twice daily   mupirocin ointment 2 % Commonly known as: BACTROBAN Add 1 inch strip to nasal saline rinse and perform rinse with ointment twice daily   neomycin-polymyxin b-dexamethasone 3.5-10000-0.1 Oint Commonly known as: MAXITROL Apply to upper and lower lids of both eyes twice a day for 1 week, then once daily for 2 weeks   neomycin-polymyxin b-dexamethasone 3.5-10000-0.1 Oint Commonly known as: MAXITROL Apply to both eyes at bedtime for 3 days.   Repatha SureClick 140 MG/ML Soaj Generic drug: Evolocumab Inject 140 mg into the skin every 14 (fourteen) days.   telmisartan 80 MG tablet Commonly known as: MICARDIS TAKE 1 TABLET DAILY   timolol 0.5 % ophthalmic solution Commonly known as: TIMOPTIC Place 1 drop into both eyes 2 (two) times  daily.   valACYclovir 500 MG tablet Commonly known as: VALTREX Take 500 mg by mouth daily.   Vitamin B Complex Tabs as directed Orally   Vitamin D 50 MCG (2000 UT) Caps Take 4,000 Units by mouth daily.          REVIEW OF SYSTEMS: A comprehensive ROS was conducted with the patient and is negative except as per HPI    OBJECTIVE:  VS: BP 120/78 (BP Location: Left Arm, Patient Position: Sitting, Cuff Size: Large)   Pulse 62   Ht 5\' 3"  (1.6 m)   Wt 186 lb 6.4 oz (84.6 kg) Comment: patient reported  SpO2 99%   BMI 33.02 kg/m    Wt Readings from Last 3 Encounters:  04/07/23 186 lb 6.4 oz (84.6 kg)  09/06/22 179 lb 9.6 oz (81.5 kg)  07/29/22 179 lb (81.2 kg)     EXAM: General: Pt appears well and is in NAD  Neck: General: Supple without adenopathy. Thyroid: Thyroid size normal.  No goiter or nodules appreciated.   Lungs: Clear with good BS bilat   Heart: Auscultation: RRR.  Abdomen: soft, nontender  Extremities:  BL LE: No pretibial edema   Mental Status: Judgment, insight: Intact Orientation: Oriented to time, place, and person Memory: Intact for recent and remote events Mood and affect: No depression, anxiety, or agitation     DATA REVIEWED:   Latest Reference Range & Units 04/07/23 10:37  Sodium 135 - 145 mEq/L 135  Potassium 3.5 - 5.1 mEq/L 4.6  Chloride 96 - 112 mEq/L 101  CO2 19 - 32 mEq/L 27  Glucose 70 - 99 mg/dL 161 (H)  BUN 6 - 23 mg/dL 17  Creatinine 0.96 - 0.45 mg/dL 4.09  Calcium 8.4 - 81.1 mg/dL 9.9  GFR >91.47 mL/min 68.74    Latest Reference Range & Units 04/07/23 10:37  TSH 0.35 - 5.50 uIU/mL 2.79  T4,Free(Direct) 0.60 - 1.60 ng/dL 8.29         DXA 5/62/1308 @ [facility]      Lumbar spine L1-L4 Femoral neck (FN) 33% distal radius  T-score 0.1 RFN: -1.9 LFN: -2.0 n/a  Change in BMD from previous DXA test (%) Down 1.4% Down 14.0% n/a  (*) statistically significant   FRAX score: 10 year major osteoporotic risk: 19.3%. 10 year hip  fracture risk: 4.6%. The thresholds for treatment are 20% and 3%, respectively.  ASSESSMENT/PLAN/RECOMMENDATIONS:   Low Bone density :  - She met criteria for anti-resorptive therapy with a FRAX of 4.6 % at the hips  , she used to be on Alendronate without reported intolerance, she has been off for years and she opted to remain off bisphosphonate therapy and focus on calcium, vitamin D, and weightbearing exercises -She was scheduled for repeat DXA scan for this year  Medications : Calcium 1200 mg a day Vitamin D 3 1000 IU daily Weightbearing exercise    2. Hypothyroidism:  -Patient is clinically euthyroid -TSH normal, no change   Medication  Continue levothyroxine 75 MCG , 2 tabs on Sundays and 1 tabs rest of the week    F/U in 1 yr   Signed electronically by: Lyndle Herrlich, MD  [facility] Endocrinology  [facility] [address] [place] [place], [place] [address] Phone: [phone] FAX: [phone]   CC: Madelin Headings, MD [address] [place] [address] Phone: [phone] Fax: [phone]   Return to Endocrinology clinic as below: Future Appointments  Date Time Provider Department Center  04/19/2023 11:00 AM Panosh, Neta Mends, MD [id number]

## 2023-04-08 ENCOUNTER — Other Ambulatory Visit (HOSPITAL_BASED_OUTPATIENT_CLINIC_OR_DEPARTMENT_OTHER): Payer: Self-pay

## 2023-04-08 LAB — PARATHYROID HORMONE, INTACT (NO CA): PTH: 29 pg/mL (ref 16–77)

## 2023-04-08 MED ORDER — LEVOTHYROXINE SODIUM 75 MCG PO TABS
75.0000 ug | ORAL_TABLET | ORAL | 3 refills | Status: DC
  Filled 2023-04-08: qty 104, 90d supply, fill #0

## 2023-04-12 ENCOUNTER — Inpatient Hospital Stay: Admission: RE | Admit: 2023-04-12 | Payer: Medicare Other | Source: Ambulatory Visit

## 2023-04-18 NOTE — Progress Notes (Unsigned)
No chief complaint on file.   HPI: Patient  Marie Kelly  77 y.o. comes in today for yearly visit  ENdo Shamleffer : hypothyroid   and osteopenia  Cards :[place] : repatha   hx of frequent pvcs HT caredilol  / telmisartan ENT slotniki  EYE:  Resp:  advair ,  singulaor    Health Maintenance  Topic Date Due   Medicare Annual Wellness (AWV)  11/22/2017   INFLUENZA VACCINE  12/16/2022   COVID-19 Vaccine (4 - 2023-24 season) 01/16/2023   DTaP/Tdap/Td (3 - Td or Tdap) 03/04/2032   Pneumonia Vaccine 55+ Years old  Completed   DEXA SCAN  Completed   Hepatitis C Screening  Completed   Zoster Vaccines- Shingrix  Completed   HPV VACCINES  Aged Out   Colonoscopy  Discontinued   Health Maintenance Review LIFESTYLE:  Exercise:   Tobacco/ETS: Alcohol:  Sugar beverages: Sleep: Drug use: no HH of  Work:    ROS:  GEN/ HEENT: No fever, significant weight changes sweats headaches vision problems hearing changes, CV/ PULM; No chest pain shortness of breath cough, syncope,edema  change in exercise tolerance. GI /GU: No adominal pain, vomiting, change in bowel habits. No blood in the stool. No significant GU symptoms. SKIN/HEME: ,no acute skin rashes suspicious lesions or bleeding. No lymphadenopathy, nodules, masses.  NEURO/ PSYCH:  No neurologic signs such as weakness numbness. No depression anxiety. IMM/ Allergy: No unusual infections.  Allergy .   REST of 12 system review negative except as per HPI   Past Medical History:  Diagnosis Date   Anemia    pt. denies this dx   Arthritis    Asthma    Closed disp fx of right olecranon with intraartic exten w nonunion 04/19/2014   Colon polyps    Complication of anesthesia    2 days after surgery-muscles hurtand spasmed-needed a muscle relaxant    Diverticulosis    Dysrhythmia    Fracture of right olecranon process 10/26/2013   GERD (gastroesophageal reflux disease)    hx - diet controlled, no longer an issue   History of  cold sores    Hyperlipidemia    had been on lipitor changes to pravachol cause of cost and tricor has since stopped  NO  se reported    Hypertension    Hypothyroidism     on meds for years   Impaired fasting glucose    Olecranon fracture 10/25/2013   right arm   Pneumonia 04/2019   x 1   PONV (postoperative nausea and vomiting)    Ventricular bigeminy seen on cardiac monitor    Cardiac work up Dr [facility]   Wears dentures    top-partial bottom   Wears glasses    at night driving and reading    Past Surgical History:  Procedure Laterality Date   BALLOON SINUPLASTY Right 01/06/2022   Procedure: BALLOON SINUPLASTY;  Surgeon: Laren Boom, DO;  [facility];  Service: ENT;  Laterality: Right;   BELPHAROPTOSIS REPAIR Bilateral    CATARACT EXTRACTION Bilateral 11/2016   CHOLECYSTECTOMY     COLONOSCOPY     ETHMOIDECTOMY Right 01/06/2022   Procedure: TOTAL ETHMOIDECTOMY;  Surgeon: Laren Boom, DO;  [facility];  Service: ENT;  Laterality: Right;   FRONTAL SINUS EXPLORATION Right 01/06/2022   Procedure: RIGHT FRONTAL RECESS EXPLORATION;  Surgeon: Laren Boom, DO;  [facility];  Service: ENT;  Laterality: Right;   HARVEST BONE GRAFT Right  04/19/2014   Procedure: HARVEST LEFT TIBIA BONE GRAFT ;  Surgeon: Eulas Post, MD;  Location: [facility];  Service: Orthopedics;  Laterality: Right;   HEMORROIDECTOMY     KNEE ARTHROSCOPY Right    MAXILLARY ANTROSTOMY Right 01/06/2022   Procedure: RIGHT MAXILLARY ANTROSTOMY;  Surgeon: Laren Boom, DO;  [facility];  Service: ENT;  Laterality: Right;   NASAL SINUS SURGERY     ORIF ELBOW FRACTURE Right 10/26/2013   Procedure: RIGHT ELBOW FRACTUE OPEN TREATMENT ULNAR PROXIMAL END OLECRANON PROCESS INCLUDES INTERNAL FIXATION;  Surgeon: Eulas Post, MD;  Location: [facility];  Service: Orthopedics;  Laterality: Right;   ORIF ELBOW FRACTURE Right 04/19/2014    Procedure: OPEN REDUCTION INTERNAL FIXATION (ORIF) RIGHT ELBOW/OLECRANON NONUNION FRACTURE WITH HARDWARE REMOVAL;  Surgeon: Eulas Post, MD;  Location: [facility];  Service: Orthopedics;  Laterality: Right;   SINUS ENDO WITH FUSION Right 01/06/2022   Procedure: RIGHT FUNCTIONAL ENDOSCOPIC SINUS SURGERY;  Surgeon: Laren Boom, DO;  [facility];  Service: ENT;  Laterality: Right;   TONSILLECTOMY     TUBAL LIGATION     UPPER GI ENDOSCOPY     uterus repair      Family History  Problem Relation Age of Onset   Stroke Mother    Diabetes Father    Heart disease Father    Alzheimer's disease Other    Stomach cancer Maternal Aunt    Colon cancer Neg Hx    Pancreatic cancer Neg Hx     Social History   Socioeconomic History   Marital status: Married    Spouse name: Not on file   Number of children: 3   Years of education: Not on file   Highest education level: Not on file  Occupational History   Not on file  Tobacco Use   Smoking status: Former    Current packs/day: 0.00    Types: Cigarettes    Quit date: 10/25/1973    Years since quitting: 49.5   Smokeless tobacco: Never  Vaping Use   Vaping status: Never Used  Substance and Sexual Activity   Alcohol use: Yes    Alcohol/week: 7.0 standard drinks of alcohol    Types: 7 Glasses of wine per week   Drug use: No   Sexual activity: Yes    Birth control/protection: Post-menopausal  Other Topics Concern   Not on file  Social History Narrative   hh of 2    Married     Husband is Still in [place]  Retired  Runner, broadcasting/film/video school bus.    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    Gravida 3 para 3   Last td 6 2008   HS educated       REmote hx of tobacco 1.5 ppd for 6 years as a teen Q 11   Social Determinants of Corporate investment banker Strain: Not on file  Food Insecurity: Not on file  Transportation Needs: Not on file  Physical Activity: Not on file   Stress: Not on file  Social Connections: Not on file    Outpatient Medications Prior to Visit  Medication Sig Dispense Refill   B Complex Vitamins (VITAMIN B COMPLEX) TABS as directed Orally     CALCIUM PO Take 3 tablets by mouth daily. Calcium Bone Maker     carvedilol (COREG) 6.25 MG tablet TAKE 1 TABLET IN  THE MORNING AND 2 TABLETS IN THE EVENING OR AS DIRECTED 270 tablet 3   Cholecalciferol (VITAMIN D) 50 MCG (2000 UT) CAPS Take 4,000 Units by mouth daily.     desonide (DESOWEN) 0.05 % cream Apply 1 Application topically 2 (two) times daily for 1-2 weeks as needed for flares. 60 g 3   Evolocumab (REPATHA SURECLICK) 140 MG/ML SOAJ Inject 140 mg into the skin every 14 (fourteen) days. 6 mL 3   fluticasone (FLONASE) 50 MCG/ACT nasal spray Place 2 sprays into both nostrils daily. 16 g 11   fluticasone-salmeterol (ADVAIR) 250-50 MCG/ACT AEPB Inhale 1 puff into the lungs in the morning and at bedtime. 180 each 3   ketoconazole (NIZORAL) 2 % cream Apply to affected area twice daily for 1-2 weeks when flaring, can mix with Desonide cream 60 g 3   latanoprost (XALATAN) 0.005 % ophthalmic solution Instill 1 drop into each eye each everning 2.5 mL 1   levothyroxine (SYNTHROID) 75 MCG tablet Take 1 tablet (75 mcg total) by mouth as directed. 2 tabs on Sundays and 1 tab rest of the week 104 tablet 3   MAGNESIUM GLUCONATE PO Take 800 mg by mouth 2 (two) times daily. 400 mg each     montelukast (SINGULAIR) 10 MG tablet TAKE 1 TABLET AT BEDTIME. SCHEDULE AN APPOINTMENT FOR FUTURE REFILLS 90 tablet 3   mupirocin ointment (BACTROBAN) 2 % Add 1 inch strip to nasal saline rinse and perform rinse with ointment twice daily 22 g 0   mupirocin ointment (BACTROBAN) 2 % Add 1 inch strip to nasal saline rinse and perform rinse with ointment twice daily 22 g 5   neomycin-polymyxin b-dexamethasone (MAXITROL) 3.5-10000-0.1 OINT Apply to upper and lower lids of both eyes twice a day for 1 week, then once daily for 2  weeks 3.5 g 0   neomycin-polymyxin-dexameth (MAXITROL) 0.1 % OINT Apply to both eyes at bedtime for 3 days. 3.5 g 1   telmisartan (MICARDIS) 80 MG tablet TAKE 1 TABLET DAILY 90 tablet 3   timolol (TIMOPTIC) 0.5 % ophthalmic solution Place 1 drop into both eyes 2 (two) times daily.     valACYclovir (VALTREX) 500 MG tablet Take 500 mg by mouth daily.     No facility-administered medications prior to visit.     EXAM:  There were no vitals taken for this visit.  There is no height or weight on file to calculate BMI. Wt Readings from Last 3 Encounters:  04/07/23 186 lb 6.4 oz (84.6 kg)  09/06/22 179 lb 9.6 oz (81.5 kg)  07/29/22 179 lb (81.2 kg)    Physical Exam: Vital signs reviewed YQM:VHQI is a well-developed well-nourished alert cooperative    who appearsr stated age in no acute distress.  HEENT: normocephalic atraumatic , Eyes: PERRL EOM's full, conjunctiva clear, Nares: paten,t no deformity discharge or tenderness., Ears: no deformity EAC's clear TMs with normal landmarks. Mouth: clear OP, no lesions, edema.  Moist mucous membranes. Dentition in adequate repair. NECK: supple without masses, thyromegaly or bruits. CHEST/PULM:  Clear to auscultation and percussion breath sounds equal no wheeze , rales or rhonchi. No chest wall deformities or tenderness. Breast: normal by inspection . No dimpling, discharge, masses, tenderness or discharge . CV: PMI is nondisplaced, S1 S2 no gallops, murmurs, rubs. Peripheral pulses are full without delay.No JVD .  ABDOMEN: Bowel sounds normal nontender  No guard or rebound, no hepato splenomegal no CVA tenderness.   Extremtities:  No clubbing cyanosis or edema, no  acute joint swelling or redness no focal atrophy NEURO:  Oriented x3, cranial nerves 3-12 appear to be intact, no obvious focal weakness,gait within normal limits no abnormal reflexes or asymmetrical SKIN: No acute rashes normal turgor, color, no bruising or petechiae. PSYCH: Oriented, good  eye contact, no obvious depression anxiety, cognition and judgment appear normal. LN: no cervical axillary adenopathy  Lab Results  Component Value Date   WBC 5.3 12/28/2021   HGB 12.4 12/28/2021   HCT 37.5 12/28/2021   PLT 249.0 12/28/2021   GLUCOSE 110 (H) 04/07/2023   CHOL 137 07/16/2021   TRIG 68 07/16/2021   HDL 64 07/16/2021   LDLDIRECT 168.5 04/23/2013   LDLCALC 59 07/16/2021   ALT 21 12/28/2021   AST 20 12/28/2021   NA 135 04/07/2023   K 4.6 04/07/2023   CL 101 04/07/2023   CREATININE 0.82 04/07/2023   BUN 17 04/07/2023   CO2 27 04/07/2023   TSH 2.79 04/07/2023   HGBA1C 5.6 12/28/2021    BP Readings from Last 3 Encounters:  04/07/23 120/78  09/06/22 128/74  07/29/22 138/68    Lab results reviewed with patient  Ldl cbc A1c over a year ago  ASSESSMENT AND PLAN:  Discussed the following assessment and plan:    ICD-10-CM   1. Hyperlipidemia, unspecified hyperlipidemia type  E78.5     2. Intermittent asthma without complication, unspecified asthma severity  J45.20     3. Medication management  Z79.899     4. High coronary artery calcium score  R93.1     5. Impaired fasting glucose  R73.01      No follow-ups on file.  Patient Care Team: Regene Mccarthy, Neta Mends, MD as PCP - General (Internal Medicine) Jens Som Madolyn Frieze, MD as PCP - Cardiology (Cardiology) Teryl Lucy, MD as Consulting Physician (Orthopedic Surgery) Shamleffer, Konrad Dolores, MD as Attending Physician (Endocrinology) Antony Contras, MD as Consulting Physician (Ophthalmology) There are no Patient Instructions on file for this visit.  Neta Mends. Heyward Douthit M.D.

## 2023-04-19 ENCOUNTER — Encounter: Payer: Self-pay | Admitting: Internal Medicine

## 2023-04-19 ENCOUNTER — Ambulatory Visit (INDEPENDENT_AMBULATORY_CARE_PROVIDER_SITE_OTHER): Payer: Medicare Other | Admitting: Internal Medicine

## 2023-04-19 VITALS — BP 98/62 | HR 62 | Temp 97.9°F | Ht 63.0 in | Wt 184.6 lb

## 2023-04-19 DIAGNOSIS — M859 Disorder of bone density and structure, unspecified: Secondary | ICD-10-CM | POA: Diagnosis not present

## 2023-04-19 DIAGNOSIS — J069 Acute upper respiratory infection, unspecified: Secondary | ICD-10-CM

## 2023-04-19 DIAGNOSIS — R931 Abnormal findings on diagnostic imaging of heart and coronary circulation: Secondary | ICD-10-CM

## 2023-04-19 DIAGNOSIS — E785 Hyperlipidemia, unspecified: Secondary | ICD-10-CM

## 2023-04-19 DIAGNOSIS — R7301 Impaired fasting glucose: Secondary | ICD-10-CM | POA: Diagnosis not present

## 2023-04-19 DIAGNOSIS — Z79899 Other long term (current) drug therapy: Secondary | ICD-10-CM

## 2023-04-19 DIAGNOSIS — J4520 Mild intermittent asthma, uncomplicated: Secondary | ICD-10-CM

## 2023-04-19 DIAGNOSIS — E559 Vitamin D deficiency, unspecified: Secondary | ICD-10-CM

## 2023-04-19 LAB — HEPATIC FUNCTION PANEL
ALT: 27 U/L (ref 0–35)
AST: 19 U/L (ref 0–37)
Albumin: 4.5 g/dL (ref 3.5–5.2)
Alkaline Phosphatase: 58 U/L (ref 39–117)
Bilirubin, Direct: 0.1 mg/dL (ref 0.0–0.3)
Total Bilirubin: 0.5 mg/dL (ref 0.2–1.2)
Total Protein: 7.4 g/dL (ref 6.0–8.3)

## 2023-04-19 LAB — CBC WITH DIFFERENTIAL/PLATELET
Basophils Absolute: 0 10*3/uL (ref 0.0–0.1)
Basophils Relative: 0.3 % (ref 0.0–3.0)
Eosinophils Absolute: 0.4 10*3/uL (ref 0.0–0.7)
Eosinophils Relative: 5.6 % — ABNORMAL HIGH (ref 0.0–5.0)
HCT: 40.9 % (ref 36.0–46.0)
Hemoglobin: 13.3 g/dL (ref 12.0–15.0)
Lymphocytes Relative: 26.5 % (ref 12.0–46.0)
Lymphs Abs: 1.7 10*3/uL (ref 0.7–4.0)
MCHC: 32.5 g/dL (ref 30.0–36.0)
MCV: 92 fL (ref 78.0–100.0)
Monocytes Absolute: 0.7 10*3/uL (ref 0.1–1.0)
Monocytes Relative: 11.2 % (ref 3.0–12.0)
Neutro Abs: 3.5 10*3/uL (ref 1.4–7.7)
Neutrophils Relative %: 56.4 % (ref 43.0–77.0)
Platelets: 383 10*3/uL (ref 150.0–400.0)
RBC: 4.45 Mil/uL (ref 3.87–5.11)
RDW: 14.6 % (ref 11.5–15.5)
WBC: 6.2 10*3/uL (ref 4.0–10.5)

## 2023-04-19 LAB — LIPID PANEL
Cholesterol: 150 mg/dL (ref 0–200)
HDL: 52.3 mg/dL (ref >39.00)
LDL Cholesterol: 80 mg/dL (ref 0–99)
NonHDL: 97.81
Total CHOL/HDL Ratio: 3
Triglycerides: 90 mg/dL (ref 0.0–149.0)
VLDL: 18 mg/dL (ref 0.0–40.0)

## 2023-04-19 LAB — HEMOGLOBIN A1C: Hgb A1c MFr Bld: 5.6 % (ref 4.6–6.5)

## 2023-04-19 LAB — VITAMIN D 25 HYDROXY (VIT D DEFICIENCY, FRACTURES): VITD: 43.72 ng/mL (ref 30.00–100.00)

## 2023-04-19 MED ORDER — MONTELUKAST SODIUM 10 MG PO TABS: ORAL_TABLET | ORAL | 3 refills | Status: DC

## 2023-04-19 NOTE — Patient Instructions (Addendum)
Check bp readings  for 3-5 days or if feel  light headed and can adjust bp medication . Maybe take 1/2 telmisartan .  Checking vit d today . Can stay on advair ;  lung exam is clear today and expect continued slow improvement .  Get back with [place] if relapsing fever shortness of breath etc.  If all ok then yearly check .

## 2023-04-21 ENCOUNTER — Ambulatory Visit (INDEPENDENT_AMBULATORY_CARE_PROVIDER_SITE_OTHER)
Admission: RE | Admit: 2023-04-21 | Discharge: 2023-04-21 | Disposition: A | Discharge status: Home / Self Care | Payer: Medicare Other | Source: Ambulatory Visit | Attending: Internal Medicine | Admitting: Internal Medicine

## 2023-04-21 DIAGNOSIS — E2839 Other primary ovarian failure: Secondary | ICD-10-CM | POA: Diagnosis not present

## 2023-04-26 ENCOUNTER — Encounter: Payer: Self-pay | Admitting: Internal Medicine

## 2023-04-26 NOTE — Progress Notes (Signed)
Lipids better  vit D in range  rest of lab normal or ok  range . Forwarding to dr Jens Som for review

## 2023-05-03 ENCOUNTER — Other Ambulatory Visit: Payer: Self-pay | Admitting: Internal Medicine

## 2023-05-17 ENCOUNTER — Other Ambulatory Visit (HOSPITAL_BASED_OUTPATIENT_CLINIC_OR_DEPARTMENT_OTHER): Payer: Self-pay

## 2023-05-17 ENCOUNTER — Other Ambulatory Visit: Payer: Self-pay

## 2023-05-17 DIAGNOSIS — H0100A Unspecified blepharitis right eye, upper and lower eyelids: Secondary | ICD-10-CM | POA: Diagnosis not present

## 2023-05-17 DIAGNOSIS — H10413 Chronic giant papillary conjunctivitis, bilateral: Secondary | ICD-10-CM | POA: Diagnosis not present

## 2023-05-17 DIAGNOSIS — H0100B Unspecified blepharitis left eye, upper and lower eyelids: Secondary | ICD-10-CM | POA: Diagnosis not present

## 2023-05-17 DIAGNOSIS — H16203 Unspecified keratoconjunctivitis, bilateral: Secondary | ICD-10-CM | POA: Diagnosis not present

## 2023-05-17 MED ORDER — PREDNISOLONE ACETATE 1 % OP SUSP
1.0000 [drp] | Freq: Two times a day (BID) | OPHTHALMIC | 0 refills | Status: DC
  Filled 2023-05-17: qty 5, 25d supply, fill #0

## 2023-05-17 MED ORDER — ERYTHROMYCIN 5 MG/GM OP OINT
TOPICAL_OINTMENT | OPHTHALMIC | 99 refills | Status: DC
  Filled 2023-05-17: qty 3.5, 14d supply, fill #0
  Filled 2023-05-24 – 2023-05-30 (×2): qty 3.5, 14d supply, fill #1
  Filled 2023-06-07: qty 3.5, 14d supply, fill #2

## 2023-05-19 ENCOUNTER — Telehealth: Payer: Self-pay | Admitting: Pharmacy Technician

## 2023-05-19 ENCOUNTER — Other Ambulatory Visit (HOSPITAL_COMMUNITY): Payer: Self-pay

## 2023-05-19 ENCOUNTER — Other Ambulatory Visit (HOSPITAL_BASED_OUTPATIENT_CLINIC_OR_DEPARTMENT_OTHER): Payer: Self-pay

## 2023-05-19 ENCOUNTER — Telehealth: Payer: Self-pay | Admitting: Cardiology

## 2023-05-19 NOTE — Telephone Encounter (Signed)
 Patient needs prior auth for her repatha.

## 2023-05-19 NOTE — Telephone Encounter (Signed)
 Pharmacy Patient Advocate Encounter   Received notification from Pt Calls Messages that prior authorization for repatha  is required/requested.   Insurance verification completed.   The patient is insured through  [employer]  .   Per test claim: Refill too soon. PA is not needed at this time. Medication was filled 05/19/23. Next eligible fill date is 07/21/23.

## 2023-05-23 ENCOUNTER — Other Ambulatory Visit (HOSPITAL_BASED_OUTPATIENT_CLINIC_OR_DEPARTMENT_OTHER): Payer: Self-pay

## 2023-05-23 ENCOUNTER — Telehealth: Payer: Self-pay | Admitting: Cardiology

## 2023-05-23 ENCOUNTER — Other Ambulatory Visit (HOSPITAL_COMMUNITY): Payer: Self-pay

## 2023-05-23 NOTE — Telephone Encounter (Signed)
 Called and spoke to patient. Verified name and DOB. Informed patient no PA needed per message below. Patient verbalized understanding and agree.    Marie Kelly D, CPhT  Repatha  filled on 05/23/23 on [facility]. Copay $96.17. No PA needed at this time

## 2023-05-23 NOTE — Telephone Encounter (Addendum)
 Patient called stating her both her insurance company and pharmacy sent a prior auth request for her repatha.  Patient states she is going to be running out soon, she is due for a shot on 1/11.

## 2023-05-23 NOTE — Telephone Encounter (Signed)
 Pt called stating [facility] is too high and she would like it sent [facility] and she asked for Dionne Milo, RN to call her.

## 2023-05-24 ENCOUNTER — Other Ambulatory Visit (HOSPITAL_BASED_OUTPATIENT_CLINIC_OR_DEPARTMENT_OTHER): Payer: Self-pay

## 2023-05-24 ENCOUNTER — Other Ambulatory Visit: Payer: Self-pay

## 2023-05-25 ENCOUNTER — Other Ambulatory Visit (HOSPITAL_BASED_OUTPATIENT_CLINIC_OR_DEPARTMENT_OTHER): Payer: Self-pay

## 2023-05-25 ENCOUNTER — Other Ambulatory Visit (HOSPITAL_COMMUNITY): Payer: Self-pay

## 2023-05-25 ENCOUNTER — Telehealth: Payer: Self-pay

## 2023-05-25 NOTE — Telephone Encounter (Signed)
 Pharmacy Patient Advocate Encounter   Received notification from Patient Pharmacy that prior authorization for REPATHA  is required/requested.   Insurance verification completed.   The patient is insured through [facility] .   Per test claim: PA required; PA submitted to above mentioned insurance via Fax Key/confirmation #/[id number] NA Status is pending

## 2023-05-27 ENCOUNTER — Other Ambulatory Visit (HOSPITAL_BASED_OUTPATIENT_CLINIC_OR_DEPARTMENT_OTHER): Payer: Self-pay

## 2023-05-27 ENCOUNTER — Other Ambulatory Visit: Payer: Self-pay

## 2023-05-27 ENCOUNTER — Other Ambulatory Visit (HOSPITAL_COMMUNITY): Payer: Self-pay

## 2023-05-27 NOTE — Telephone Encounter (Signed)
 Pharmacy Patient Advocate Encounter  Received notification from [employer] that Prior Authorization for REPATHA  has been APPROVED from 03/26/23 to 05/24/24. Ran test claim, Copay is $25. This test claim was processed through [facility]- copay amounts may vary at other pharmacies due to pharmacy/plan contracts, or as the patient moves through the different stages of their insurance plan.

## 2023-05-28 ENCOUNTER — Other Ambulatory Visit (HOSPITAL_BASED_OUTPATIENT_CLINIC_OR_DEPARTMENT_OTHER): Payer: Self-pay

## 2023-05-30 ENCOUNTER — Other Ambulatory Visit (HOSPITAL_BASED_OUTPATIENT_CLINIC_OR_DEPARTMENT_OTHER): Payer: Self-pay

## 2023-06-06 ENCOUNTER — Other Ambulatory Visit (HOSPITAL_BASED_OUTPATIENT_CLINIC_OR_DEPARTMENT_OTHER): Payer: Self-pay

## 2023-06-07 DIAGNOSIS — H10503 Unspecified blepharoconjunctivitis, bilateral: Secondary | ICD-10-CM | POA: Diagnosis not present

## 2023-06-07 DIAGNOSIS — H401112 Primary open-angle glaucoma, right eye, moderate stage: Secondary | ICD-10-CM | POA: Diagnosis not present

## 2023-06-14 DIAGNOSIS — H0100A Unspecified blepharitis right eye, upper and lower eyelids: Secondary | ICD-10-CM | POA: Diagnosis not present

## 2023-06-14 DIAGNOSIS — H0100B Unspecified blepharitis left eye, upper and lower eyelids: Secondary | ICD-10-CM | POA: Diagnosis not present

## 2023-06-17 ENCOUNTER — Other Ambulatory Visit (HOSPITAL_BASED_OUTPATIENT_CLINIC_OR_DEPARTMENT_OTHER): Payer: Self-pay

## 2023-07-04 ENCOUNTER — Other Ambulatory Visit (HOSPITAL_BASED_OUTPATIENT_CLINIC_OR_DEPARTMENT_OTHER): Payer: Self-pay

## 2023-07-04 DIAGNOSIS — L309 Dermatitis, unspecified: Secondary | ICD-10-CM | POA: Diagnosis not present

## 2023-07-04 MED ORDER — HYDROCORTISONE 2.5 % EX OINT
TOPICAL_OINTMENT | CUTANEOUS | 1 refills | Status: DC
  Filled 2023-07-04: qty 20, 30d supply, fill #0
  Filled 2023-07-27: qty 20, 30d supply, fill #1

## 2023-07-12 ENCOUNTER — Other Ambulatory Visit (HOSPITAL_BASED_OUTPATIENT_CLINIC_OR_DEPARTMENT_OTHER): Payer: Self-pay

## 2023-07-12 DIAGNOSIS — H01114 Allergic dermatitis of left upper eyelid: Secondary | ICD-10-CM | POA: Diagnosis not present

## 2023-07-12 DIAGNOSIS — H01111 Allergic dermatitis of right upper eyelid: Secondary | ICD-10-CM | POA: Diagnosis not present

## 2023-07-12 MED ORDER — LOTEPREDNOL ETABONATE 0.5 % OP OINT
TOPICAL_OINTMENT | OPHTHALMIC | 1 refills | Status: DC
Start: 2023-07-12 — End: 2024-04-11
  Filled 2023-07-12: qty 3.5, 14d supply, fill #0
  Filled 2023-07-20 – 2023-12-26 (×2): qty 3.5, 14d supply, fill #1

## 2023-07-13 ENCOUNTER — Other Ambulatory Visit (HOSPITAL_BASED_OUTPATIENT_CLINIC_OR_DEPARTMENT_OTHER): Payer: Self-pay

## 2023-07-17 ENCOUNTER — Encounter: Payer: Self-pay | Admitting: Internal Medicine

## 2023-07-18 ENCOUNTER — Other Ambulatory Visit: Payer: Self-pay

## 2023-07-18 MED ORDER — LEVOTHYROXINE SODIUM 75 MCG PO TABS: 75.0000 ug | ORAL_TABLET | ORAL | 3 refills | Status: DC

## 2023-07-20 ENCOUNTER — Other Ambulatory Visit (HOSPITAL_BASED_OUTPATIENT_CLINIC_OR_DEPARTMENT_OTHER): Payer: Self-pay

## 2023-07-24 ENCOUNTER — Encounter: Payer: Self-pay | Admitting: Internal Medicine

## 2023-07-24 DIAGNOSIS — H01119 Allergic dermatitis of unspecified eye, unspecified eyelid: Secondary | ICD-10-CM

## 2023-07-26 DIAGNOSIS — H04123 Dry eye syndrome of bilateral lacrimal glands: Secondary | ICD-10-CM | POA: Diagnosis not present

## 2023-07-26 DIAGNOSIS — H01115 Allergic dermatitis of left lower eyelid: Secondary | ICD-10-CM | POA: Diagnosis not present

## 2023-07-26 DIAGNOSIS — H01114 Allergic dermatitis of left upper eyelid: Secondary | ICD-10-CM | POA: Diagnosis not present

## 2023-07-26 NOTE — Progress Notes (Signed)
 HPI: FU CAD. Echocardiogram February 2015 showed normal LV function and mild right ventricular enlargement.  Calcium score July 2022 234 which was 74th percentile.  Since last seen there is no dyspnea, chest pain, palpitations or syncope.  Current Outpatient Medications  Medication Sig Dispense Refill   B Complex Vitamins (VITAMIN B COMPLEX) TABS as directed Orally     CALCIUM PO Take 3 tablets by mouth daily. Calcium Bone Maker     carvedilol (COREG) 6.25 MG tablet Take 0.5 tablets (3.125 mg total) by mouth 2 (two) times daily with a meal.     Cholecalciferol (VITAMIN D) 50 MCG (2000 UT) CAPS Take 4,000 Units by mouth daily.     desonide (DESOWEN) 0.05 % cream Apply 1 Application topically 2 (two) times daily for 1-2 weeks as needed for flares. 60 g 3   Evolocumab (REPATHA SURECLICK) 140 MG/ML SOAJ Inject 140 mg into the skin every 14 (fourteen) days. 6 mL 3   fluticasone (FLONASE) 50 MCG/ACT nasal spray Place 2 sprays into both nostrils daily. 16 g 11   fluticasone-salmeterol (ADVAIR) 250-50 MCG/ACT AEPB Inhale 1 puff into the lungs in the morning and at bedtime. 180 each 3   ketoconazole (NIZORAL) 2 % cream Apply to affected area twice daily for 1-2 weeks when flaring, can mix with Desonide cream 60 g 3   levothyroxine (SYNTHROID) 75 MCG tablet Take 1 tablet (75 mcg total) by mouth as directed. 2 tabs on Sundays and 1 tab rest of the week 104 tablet 3   Loteprednol Etabonate 0.5 % OINT Apply a small bead of ointment to UPPER and LOWER lid of both eyes twice daily for 2 weeks then once daily for a third week. 3.5 g 1   MAGNESIUM GLUCONATE PO Take 800 mg by mouth 2 (two) times daily. 400 mg each     montelukast (SINGULAIR) 10 MG tablet TAKE 1 TABLET AT BEDTIME. 90 tablet 3   mupirocin ointment (BACTROBAN) 2 % Add 1 inch strip to nasal saline rinse and perform rinse with ointment twice daily 22 g 0   telmisartan (MICARDIS) 80 MG tablet TAKE 1 TABLET DAILY 90 tablet 3   timolol (TIMOPTIC)  0.5 % ophthalmic solution Place 1 drop into both eyes 2 (two) times daily.     valACYclovir (VALTREX) 500 MG tablet Take 500 mg by mouth daily. Takes 1/2 tab.     erythromycin ophthalmic ointment Apply to upper and lower lid margins nightly at bedtime for 3 months 3.5 g PRN   hydrocortisone 2.5 % ointment Apply a thin layer to affected area(s) twice a day  x 1 week only as needed for flares.  Not safe for long term use 20 g 1   latanoprost (XALATAN) 0.005 % ophthalmic solution Instill 1 drop into each eye each everning 2.5 mL 1   neomycin-polymyxin b-dexamethasone (MAXITROL) 3.5-10000-0.1 OINT Apply to upper and lower lids of both eyes twice a day for 1 week, then once daily for 2 weeks (Patient taking differently: as needed.) 3.5 g 0   prednisoLONE acetate (PRED FORTE) 1 % ophthalmic suspension Place 1 drop into both eyes 2 (two) times daily for 3 weeks 5 mL 0   No current facility-administered medications for this visit.     Past Medical History:  Diagnosis Date   Anemia    pt. denies this dx   Arthritis    Asthma    Closed disp fx of right olecranon with intraartic exten w nonunion 04/19/2014  Colon polyps    Complication of anesthesia    2 days after surgery-muscles hurtand spasmed-needed a muscle relaxant    Diverticulosis    Dysrhythmia    Fracture of right olecranon process 10/26/2013   GERD (gastroesophageal reflux disease)    hx - diet controlled, no longer an issue   History of cold sores    Hyperlipidemia    had been on lipitor changes to pravachol cause of cost and tricor has since stopped  NO  se reported    Hypertension    Hypothyroidism     on meds for years   Impaired fasting glucose    Olecranon fracture 10/25/2013   right arm   Pneumonia 04/2019   x 1   PONV (postoperative nausea and vomiting)    Ventricular bigeminy seen on cardiac monitor    Cardiac work up Dr [facility]   Wears dentures    top-partial bottom   Wears glasses    at night  driving and reading    Past Surgical History:  Procedure Laterality Date   BALLOON SINUPLASTY Right 01/06/2022   Procedure: BALLOON SINUPLASTY;  Surgeon: Laren Boom, DO;  [facility];  Service: ENT;  Laterality: Right;   BELPHAROPTOSIS REPAIR Bilateral    CATARACT EXTRACTION Bilateral 11/2016   CHOLECYSTECTOMY     COLONOSCOPY     ETHMOIDECTOMY Right 01/06/2022   Procedure: TOTAL ETHMOIDECTOMY;  Surgeon: Laren Boom, DO;  [facility];  Service: ENT;  Laterality: Right;   FRONTAL SINUS EXPLORATION Right 01/06/2022   Procedure: RIGHT FRONTAL RECESS EXPLORATION;  Surgeon: Laren Boom, DO;  [facility];  Service: ENT;  Laterality: Right;   HARVEST BONE GRAFT Right 04/19/2014   Procedure: HARVEST LEFT TIBIA BONE GRAFT ;  Surgeon: Eulas Post, MD;  Location: [facility];  Service: Orthopedics;  Laterality: Right;   HEMORROIDECTOMY     KNEE ARTHROSCOPY Right    MAXILLARY ANTROSTOMY Right 01/06/2022   Procedure: RIGHT MAXILLARY ANTROSTOMY;  Surgeon: Laren Boom, DO;  [facility];  Service: ENT;  Laterality: Right;   NASAL SINUS SURGERY     ORIF ELBOW FRACTURE Right 10/26/2013   Procedure: RIGHT ELBOW FRACTUE OPEN TREATMENT ULNAR PROXIMAL END OLECRANON PROCESS INCLUDES INTERNAL FIXATION;  Surgeon: Eulas Post, MD;  Location: [facility];  Service: Orthopedics;  Laterality: Right;   ORIF ELBOW FRACTURE Right 04/19/2014   Procedure: OPEN REDUCTION INTERNAL FIXATION (ORIF) RIGHT ELBOW/OLECRANON NONUNION FRACTURE WITH HARDWARE REMOVAL;  Surgeon: Eulas Post, MD;  Location: [facility];  Service: Orthopedics;  Laterality: Right;   SINUS ENDO WITH FUSION Right 01/06/2022   Procedure: RIGHT FUNCTIONAL ENDOSCOPIC SINUS SURGERY;  Surgeon: Laren Boom, DO;  [facility];  Service: ENT;  Laterality: Right;   TONSILLECTOMY     TUBAL LIGATION     UPPER GI ENDOSCOPY     uterus repair      Social  History   Socioeconomic History   Marital status: Married    Spouse name: Not on file   Number of children: 3   Years of education: Not on file   Highest education level: Not on file  Occupational History   Not on file  Tobacco Use   Smoking status: Former    Current packs/day: 0.00    Types: Cigarettes    Quit date: 10/25/1973    Years since quitting: 49.8   Smokeless tobacco: Never  Vaping Use  Vaping status: Never Used  Substance and Sexual Activity   Alcohol use: Not Currently   Drug use: No   Sexual activity: Yes    Partners: Male    Birth control/protection: Post-menopausal    Comment: MARRIED  Other Topics Concern   Not on file  Social History Narrative   hh of 2    Married     Husband is Still in [place]  Retired  Runner, broadcasting/film/video school bus.    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    Gravida 3 para 3   Last td 6 2008   HS educated       REmote hx of tobacco 1.5 ppd for 6 years as a teen Q 75   Social Drivers of Corporate investment banker Strain: Low Risk  (04/19/2023)   Overall Financial Resource Strain (CARDIA)    Difficulty of Paying Living Expenses: Not hard at all  Food Insecurity: Not on file  Transportation Needs: Not on file  Physical Activity: Insufficiently Active (04/19/2023)   Exercise Vital Sign    Days of Exercise per Week: 3 days    Minutes of Exercise per Session: 30 min  Stress: No Stress Concern Present (04/19/2023)   [employer] of Occupational Health - Occupational Stress Questionnaire    Feeling of Stress : Not at all  Social Connections: Socially Integrated (04/19/2023)   Social Connection and Isolation Panel [NHANES]    Frequency of Communication with Friends and Family: More than three times a week    Frequency of Social Gatherings with Friends and Family: Once a week    Attends Religious Services: More than 4 times per year    Active Member of [employer] or Organizations: Yes    Attends Probation officer: More than 4 times per year    Marital Status: Married  Catering manager Violence: Not on file    Family History  Problem Relation Age of Onset   Stroke Mother    Diabetes Father    Heart disease Father    Alzheimer's disease Other    Stomach cancer Maternal Aunt    Colon cancer Neg Hx    Pancreatic cancer Neg Hx     ROS: no fevers or chills, productive cough, hemoptysis, dysphasia, odynophagia, melena, hematochezia, dysuria, hematuria, rash, seizure activity, orthopnea, PND, pedal edema, claudication. Remaining systems are negative.  Physical Exam: Well-developed well-nourished in no acute distress.  Skin is warm and dry.  HEENT is normal.  Neck is supple.  Chest is clear to auscultation with normal expansion.  Cardiovascular exam is regular rate and rhythm.  Abdominal exam nontender or distended. No masses palpated. Extremities show no edema. neuro grossly intact  EKG Interpretation Date/Time:  Tuesday August 09 2023 10:21:35 EDT Ventricular Rate:  56 PR Interval:  162 QRS Duration:  82 QT Interval:  446 QTC Calculation: 430 R Axis:   20  Text Interpretation: Sinus bradycardia Low voltage QRS When compared with ECG of 08-Jun-2017 10:55, No significant change was found Confirmed by Olga Millers ([id number]) on 08/09/2023 10:27:35 AM     A/P  1 coronary artery disease-based on previously elevated calcium score.  She denies chest pain.  Continue aspirin.  Intolerant to statins.  2 hypertension-patient's blood pressure is controlled.  Continue present medications and follow.  3 hyperlipidemia-intolerant to statins.  Continue Repatha.  Recent LDL 80.  Add Zetia 10 mg daily.  Check lipids and liver  in 8 weeks.  Olga Millers, MD

## 2023-07-26 NOTE — Telephone Encounter (Signed)
 A referral is placed.   Pt is aware that someone will contact her to schedule an appointment. Pt reports she will be out of country from March 31st to April 23- she won't be answering call around those time frame. Information noted on the referral order.

## 2023-07-27 ENCOUNTER — Other Ambulatory Visit (HOSPITAL_BASED_OUTPATIENT_CLINIC_OR_DEPARTMENT_OTHER): Payer: Self-pay

## 2023-08-04 ENCOUNTER — Other Ambulatory Visit (HOSPITAL_BASED_OUTPATIENT_CLINIC_OR_DEPARTMENT_OTHER): Payer: Self-pay

## 2023-08-05 ENCOUNTER — Encounter: Payer: Self-pay | Admitting: Internal Medicine

## 2023-08-08 ENCOUNTER — Other Ambulatory Visit (HOSPITAL_BASED_OUTPATIENT_CLINIC_OR_DEPARTMENT_OTHER): Payer: Self-pay

## 2023-08-09 ENCOUNTER — Encounter: Payer: Self-pay | Admitting: Cardiology

## 2023-08-09 ENCOUNTER — Ambulatory Visit: Payer: Medicare Other | Attending: Cardiology | Admitting: Cardiology

## 2023-08-09 ENCOUNTER — Other Ambulatory Visit (HOSPITAL_BASED_OUTPATIENT_CLINIC_OR_DEPARTMENT_OTHER): Payer: Self-pay

## 2023-08-09 VITALS — BP 132/66 | HR 56 | Ht 63.0 in | Wt 189.6 lb

## 2023-08-09 DIAGNOSIS — I10 Essential (primary) hypertension: Secondary | ICD-10-CM | POA: Diagnosis not present

## 2023-08-09 DIAGNOSIS — E785 Hyperlipidemia, unspecified: Secondary | ICD-10-CM | POA: Insufficient documentation

## 2023-08-09 DIAGNOSIS — R931 Abnormal findings on diagnostic imaging of heart and coronary circulation: Secondary | ICD-10-CM | POA: Diagnosis not present

## 2023-08-09 MED ORDER — EZETIMIBE 10 MG PO TABS
10.0000 mg | ORAL_TABLET | Freq: Every day | ORAL | 3 refills | Status: DC
  Filled 2023-08-09 – 2023-08-12 (×2): qty 90, 90d supply, fill #0

## 2023-08-09 NOTE — Patient Instructions (Signed)
 Medication Instructions:   START EZETIMIBE 10 MG ONCE DAILY  *If you need a refill on your cardiac medications before your next appointment, please call your pharmacy*   Lab Work:  Your physician recommends that you return for lab work in: 8 WEEKS AFTER STARTING EZETIMIBE-FASTING  If you have labs (blood work) drawn today and your tests are completely normal, you will receive your results only by: MyChart Message (if you have MyChart) OR A paper copy in the mail If you have any lab test that is abnormal or we need to change your treatment, we will call you to review the results.    Follow-Up: At [facility], you and your health needs are our priority.  As part of our continuing mission to provide you with exceptional heart care, we have created designated Provider Care Teams.  These Care Teams include your primary Cardiologist (physician) and Advanced Practice Providers (APPs -  Physician Assistants and Nurse Practitioners) who all work together to provide you with the care you need, when you need it.   Your next appointment:   12 month(s)  Provider:   Olga Millers, MD

## 2023-08-10 DIAGNOSIS — H401131 Primary open-angle glaucoma, bilateral, mild stage: Secondary | ICD-10-CM | POA: Diagnosis not present

## 2023-08-11 ENCOUNTER — Encounter: Payer: Self-pay | Admitting: Cardiology

## 2023-08-11 ENCOUNTER — Other Ambulatory Visit (HOSPITAL_BASED_OUTPATIENT_CLINIC_OR_DEPARTMENT_OTHER): Payer: Self-pay

## 2023-08-12 ENCOUNTER — Other Ambulatory Visit (HOSPITAL_BASED_OUTPATIENT_CLINIC_OR_DEPARTMENT_OTHER): Payer: Self-pay

## 2023-08-15 ENCOUNTER — Other Ambulatory Visit (HOSPITAL_BASED_OUTPATIENT_CLINIC_OR_DEPARTMENT_OTHER): Payer: Self-pay

## 2023-08-18 ENCOUNTER — Other Ambulatory Visit (HOSPITAL_BASED_OUTPATIENT_CLINIC_OR_DEPARTMENT_OTHER): Payer: Self-pay

## 2023-09-08 ENCOUNTER — Other Ambulatory Visit (HOSPITAL_BASED_OUTPATIENT_CLINIC_OR_DEPARTMENT_OTHER): Payer: Self-pay

## 2023-09-23 ENCOUNTER — Encounter: Payer: Self-pay | Admitting: Internal Medicine

## 2023-09-27 NOTE — Telephone Encounter (Signed)
Please refill for  6 months worth  

## 2023-09-27 NOTE — Telephone Encounter (Signed)
 Contacted [facility] and spoke to Bagley, Associate Professor. Inform him to fill Rx for 6 months supply. He states they will do that. No further action is needed at this time.

## 2023-09-28 ENCOUNTER — Other Ambulatory Visit: Payer: Self-pay | Admitting: Family

## 2023-10-18 ENCOUNTER — Encounter: Payer: Self-pay | Admitting: Internal Medicine

## 2023-10-18 MED ORDER — TELMISARTAN 80 MG PO TABS: 80.0000 mg | ORAL_TABLET | Freq: Every day | ORAL | 3 refills | Status: AC

## 2023-10-31 ENCOUNTER — Other Ambulatory Visit (HOSPITAL_BASED_OUTPATIENT_CLINIC_OR_DEPARTMENT_OTHER): Payer: Self-pay

## 2023-10-31 MED ORDER — MUPIROCIN 2 % EX OINT
TOPICAL_OINTMENT | CUTANEOUS | 5 refills | Status: AC
  Filled 2023-10-31: qty 22, 14d supply, fill #0

## 2023-11-10 DIAGNOSIS — H401131 Primary open-angle glaucoma, bilateral, mild stage: Secondary | ICD-10-CM | POA: Diagnosis not present

## 2023-11-29 ENCOUNTER — Other Ambulatory Visit (HOSPITAL_BASED_OUTPATIENT_CLINIC_OR_DEPARTMENT_OTHER): Payer: Self-pay

## 2023-12-27 ENCOUNTER — Other Ambulatory Visit (HOSPITAL_BASED_OUTPATIENT_CLINIC_OR_DEPARTMENT_OTHER): Payer: Self-pay

## 2024-01-26 ENCOUNTER — Other Ambulatory Visit: Payer: Self-pay | Admitting: Internal Medicine

## 2024-01-26 DIAGNOSIS — Z1231 Encounter for screening mammogram for malignant neoplasm of breast: Secondary | ICD-10-CM

## 2024-02-08 ENCOUNTER — Other Ambulatory Visit (HOSPITAL_BASED_OUTPATIENT_CLINIC_OR_DEPARTMENT_OTHER): Payer: Self-pay

## 2024-02-08 ENCOUNTER — Other Ambulatory Visit (HOSPITAL_COMMUNITY): Payer: Self-pay

## 2024-02-08 ENCOUNTER — Other Ambulatory Visit: Payer: Self-pay | Admitting: Cardiology

## 2024-02-08 DIAGNOSIS — R931 Abnormal findings on diagnostic imaging of heart and coronary circulation: Secondary | ICD-10-CM

## 2024-02-08 DIAGNOSIS — E785 Hyperlipidemia, unspecified: Secondary | ICD-10-CM

## 2024-02-08 MED ORDER — REPATHA SURECLICK 140 MG/ML ~~LOC~~ SOAJ
140.0000 mg | SUBCUTANEOUS | 3 refills | Status: AC
  Filled 2024-02-08: qty 6, 84d supply, fill #0
  Filled 2024-05-07: qty 6, 84d supply, fill #1

## 2024-02-09 ENCOUNTER — Other Ambulatory Visit (HOSPITAL_BASED_OUTPATIENT_CLINIC_OR_DEPARTMENT_OTHER): Payer: Self-pay

## 2024-02-16 ENCOUNTER — Other Ambulatory Visit (HOSPITAL_BASED_OUTPATIENT_CLINIC_OR_DEPARTMENT_OTHER): Payer: Self-pay

## 2024-02-16 MED ORDER — PEG 3350-KCL-NABCB-NACL-NASULF 236 G PO SOLR
ORAL | 0 refills | Status: DC
  Filled 2024-02-16: qty 4000, 1d supply, fill #0

## 2024-02-16 MED ORDER — BISACODYL EC 5 MG PO TBEC
DELAYED_RELEASE_TABLET | ORAL | 0 refills | Status: DC
  Filled 2024-02-16: qty 4, 1d supply, fill #0

## 2024-02-21 ENCOUNTER — Ambulatory Visit

## 2024-02-21 DIAGNOSIS — Z09 Encounter for follow-up examination after completed treatment for conditions other than malignant neoplasm: Secondary | ICD-10-CM | POA: Diagnosis not present

## 2024-02-21 DIAGNOSIS — Z860100 Personal history of colon polyps, unspecified: Secondary | ICD-10-CM | POA: Diagnosis not present

## 2024-02-21 DIAGNOSIS — D125 Benign neoplasm of sigmoid colon: Secondary | ICD-10-CM | POA: Diagnosis not present

## 2024-02-21 DIAGNOSIS — K5520 Angiodysplasia of colon without hemorrhage: Secondary | ICD-10-CM | POA: Diagnosis not present

## 2024-02-21 DIAGNOSIS — Z800 Family history of malignant neoplasm of digestive organs: Secondary | ICD-10-CM | POA: Diagnosis not present

## 2024-02-21 DIAGNOSIS — K5730 Diverticulosis of large intestine without perforation or abscess without bleeding: Secondary | ICD-10-CM | POA: Diagnosis not present

## 2024-02-21 LAB — HM COLONOSCOPY

## 2024-02-23 DIAGNOSIS — D125 Benign neoplasm of sigmoid colon: Secondary | ICD-10-CM | POA: Diagnosis not present

## 2024-02-27 ENCOUNTER — Ambulatory Visit
Admission: RE | Admit: 2024-02-27 | Discharge: 2024-02-27 | Disposition: A | Discharge status: Home / Self Care | Source: Ambulatory Visit | Attending: Internal Medicine | Admitting: Internal Medicine

## 2024-02-27 DIAGNOSIS — Z1231 Encounter for screening mammogram for malignant neoplasm of breast: Secondary | ICD-10-CM | POA: Diagnosis not present

## 2024-02-29 DIAGNOSIS — H33101 Unspecified retinoschisis, right eye: Secondary | ICD-10-CM | POA: Diagnosis not present

## 2024-02-29 DIAGNOSIS — H43813 Vitreous degeneration, bilateral: Secondary | ICD-10-CM | POA: Diagnosis not present

## 2024-02-29 DIAGNOSIS — H35431 Paving stone degeneration of retina, right eye: Secondary | ICD-10-CM | POA: Diagnosis not present

## 2024-02-29 DIAGNOSIS — H35371 Puckering of macula, right eye: Secondary | ICD-10-CM | POA: Diagnosis not present

## 2024-02-29 DIAGNOSIS — H353131 Nonexudative age-related macular degeneration, bilateral, early dry stage: Secondary | ICD-10-CM | POA: Diagnosis not present

## 2024-04-06 ENCOUNTER — Ambulatory Visit: Payer: Medicare Other | Admitting: Internal Medicine

## 2024-04-11 ENCOUNTER — Ambulatory Visit (INDEPENDENT_AMBULATORY_CARE_PROVIDER_SITE_OTHER): Admitting: Internal Medicine

## 2024-04-11 ENCOUNTER — Other Ambulatory Visit

## 2024-04-11 ENCOUNTER — Encounter: Payer: Self-pay | Admitting: Internal Medicine

## 2024-04-11 VITALS — BP 142/80 | HR 62 | Ht 63.0 in | Wt 180.0 lb

## 2024-04-11 DIAGNOSIS — M8580 Other specified disorders of bone density and structure, unspecified site: Secondary | ICD-10-CM | POA: Diagnosis not present

## 2024-04-11 DIAGNOSIS — E559 Vitamin D deficiency, unspecified: Secondary | ICD-10-CM

## 2024-04-11 DIAGNOSIS — E039 Hypothyroidism, unspecified: Secondary | ICD-10-CM | POA: Diagnosis not present

## 2024-04-11 DIAGNOSIS — M859 Disorder of bone density and structure, unspecified: Secondary | ICD-10-CM | POA: Diagnosis not present

## 2024-04-11 MED ORDER — LEVOTHYROXINE SODIUM 75 MCG PO TABS: 75.0000 ug | ORAL_TABLET | ORAL | 3 refills | Status: DC

## 2024-04-11 NOTE — Progress Notes (Signed)
 Name: Marie Kelly  MRN/ DOB: [id number], 1945/08/17    Age/ Sex: 78 y.o., female    PCP: Charlett Apolinar POUR, MD   Reason for Endocrinology Evaluation: Low Bone density      Date of Initial Endocrinology Evaluation: 03/09/2021    HPI: [name] is a 78 y.o. female with a past medical history of low bone density, Hypothyroidism, and Asthma . The patient presented for initial endocrinology clinic visit on 03/09/2021 for consultative assistance with her Low bone density .   Pt was diagnosed with low bone density : in 09/2014 with a right femoral neck T-Score -1.2   Menarche at age : 83 Menopausal at age : late 58's  Fracture Hx: elbow fracture - fell down the steps  Hx of HRT: no FH of osteoporosis or hip fracture: mother  Prior Hx of anti-estrogenic therapy :no  Prior Hx of anti-resorptive therapy : Fosamax - long time ago    On her initial visit to our clinic she had a TSH of 5.35 uIu/mL , we increased levothyroxine  dose  The patient declined antiresorptive therapy on her initial visit with me and opted to continue with lifestyle changes    SUBJECTIVE:    Today (04/11/24):  Marie Kelly is here for follow-up on low bone density and hypothyroidism.    Patient has been noted weight loss over the past year No local neck swelling  No dysphagia No palpitations  No constipation or diarrhea    Calcium /Mg 3 tablets  Vitamin D  2000 iu daily   Levothyroxine  75 mcg, 2 tabs on Sundays and 1 tab rest of the week    HISTORY:  Past Medical History:  Past Medical History:  Diagnosis Date   Anemia    pt. denies this dx   Arthritis    Asthma    Closed disp fx of right olecranon with intraartic exten w nonunion 04/19/2014   Colon polyps    Complication of anesthesia    2 days after surgery-muscles hurtand spasmed-needed a muscle relaxant    Diverticulosis    Dysrhythmia    Fracture of right olecranon process 10/26/2013   GERD (gastroesophageal reflux  disease)    hx - diet controlled, no longer an issue   History of cold sores    Hyperlipidemia    had been on lipitor changes to pravachol cause of cost and tricor has since stopped  NO  se reported    Hypertension    Hypothyroidism     on meds for years   Impaired fasting glucose    Olecranon fracture 10/25/2013   right arm   Pneumonia 04/2019   x 1   PONV (postoperative nausea and vomiting)    Ventricular bigeminy seen on cardiac monitor    Cardiac work up Dr [facility]   Wears dentures    top-partial bottom   Wears glasses    at night driving and reading   Past Surgical History:  Past Surgical History:  Procedure Laterality Date   BALLOON SINUPLASTY Right 01/06/2022   Procedure: BALLOON SINUPLASTY;  Surgeon: Llewellyn Gerard DELENA, DO;  [facility];  Service: ENT;  Laterality: Right;   BELPHAROPTOSIS REPAIR Bilateral    CATARACT EXTRACTION Bilateral 11/2016   CHOLECYSTECTOMY     COLONOSCOPY     ETHMOIDECTOMY Right 01/06/2022   Procedure: TOTAL ETHMOIDECTOMY;  Surgeon: Llewellyn Gerard DELENA, DO;  [facility];  Service: ENT;  Laterality: Right;   FRONTAL SINUS EXPLORATION  Right 01/06/2022   Procedure: RIGHT FRONTAL RECESS EXPLORATION;  Surgeon: Llewellyn Gerard LABOR, DO;  [facility];  Service: ENT;  Laterality: Right;   HARVEST BONE GRAFT Right 04/19/2014   Procedure: HARVEST LEFT TIBIA BONE GRAFT ;  Surgeon: Fonda SHAUNNA Olmsted, MD;  Location: [facility];  Service: Orthopedics;  Laterality: Right;   HEMORROIDECTOMY     KNEE ARTHROSCOPY Right    MAXILLARY ANTROSTOMY Right 01/06/2022   Procedure: RIGHT MAXILLARY ANTROSTOMY;  Surgeon: Llewellyn Gerard LABOR, DO;  [facility];  Service: ENT;  Laterality: Right;   NASAL SINUS SURGERY     ORIF ELBOW FRACTURE Right 10/26/2013   Procedure: RIGHT ELBOW FRACTUE OPEN TREATMENT ULNAR PROXIMAL END OLECRANON PROCESS INCLUDES INTERNAL FIXATION;  Surgeon: Fonda SHAUNNA Olmsted, MD;  Location: [facility];   Service: Orthopedics;  Laterality: Right;   ORIF ELBOW FRACTURE Right 04/19/2014   Procedure: OPEN REDUCTION INTERNAL FIXATION (ORIF) RIGHT ELBOW/OLECRANON NONUNION FRACTURE WITH HARDWARE REMOVAL;  Surgeon: Fonda SHAUNNA Olmsted, MD;  Location: [facility];  Service: Orthopedics;  Laterality: Right;   SINUS ENDO WITH FUSION Right 01/06/2022   Procedure: RIGHT FUNCTIONAL ENDOSCOPIC SINUS SURGERY;  Surgeon: Llewellyn Gerard LABOR, DO;  [facility];  Service: ENT;  Laterality: Right;   TONSILLECTOMY     TUBAL LIGATION     UPPER GI ENDOSCOPY     uterus repair      Social History:  reports that she quit smoking about 50 years ago. Her smoking use included cigarettes. She has never used smokeless tobacco. She reports that she does not currently use alcohol. She reports that she does not use drugs. Family History: family history includes Alzheimer's disease in an other family member; Diabetes in her father; Heart disease in her father; Stomach cancer in her maternal aunt; Stroke in her mother.   HOME MEDICATIONS: Allergies as of 04/11/2024       Reactions   Crestor  [rosuvastatin ]    myalgias   Hydrochlorothiazide    Muscles tightness and felt like she was hit by a truck   Lipitor [atorvastatin]    Myalgias        Medication List        Accurate as of April 11, 2024 10:16 AM. If you have any questions, ask your nurse or doctor.          STOP taking these medications    bisacodyl  5 MG EC tablet Generic drug: bisacodyl  Stopped by: Donell PARAS Isidro Monks   erythromycin  ophthalmic ointment Stopped by: Donell PARAS Abbye Lao   ezetimibe  10 MG tablet Commonly known as: ZETIA  Stopped by: Donell PARAS Gaspare Netzel   GaviLyte-G 236 g solution Generic drug: polyethylene glycol Stopped by: Donell PARAS Breyonna Nault   hydrocortisone  2.5 % ointment Stopped by: Donell PARAS Erick Murin   latanoprost  0.005 % ophthalmic solution Commonly known as: XALATAN  Stopped by: Donell PARAS  Araceli Arango   Lotemax  0.5 % Oint Generic drug: Loteprednol  Etabonate Stopped by: Donell PARAS Emylie Amster   neomycin -polymyxin b-dexamethasone  3.5-10000-0.1 Oint Commonly known as: MAXITROL  Stopped by: Sharon Rubis J Demontez Novack   prednisoLONE  acetate 1 % ophthalmic suspension Commonly known as: PRED FORTE  Stopped by: Emmery Seiler J Taiyo Kozma       TAKE these medications    CALCIUM  PO Take 3 tablets by mouth daily. Calcium  Bone Maker   carvedilol  6.25 MG tablet Commonly known as: COREG  Take 0.5 tablets (3.125 mg total) by mouth 2 (two) times daily with a meal.   desonide  0.05 % cream Commonly known  as: DESOWEN  Apply 1 Application topically 2 (two) times daily for 1-2 weeks as needed for flares.   fluticasone  50 MCG/ACT nasal spray Commonly known as: FLONASE  Place 2 sprays into both nostrils daily.   fluticasone -salmeterol 250-50 MCG/ACT Aepb Commonly known as: ADVAIR Inhale 1 puff into the lungs in the morning and at bedtime.   ketoconazole  2 % cream Commonly known as: NIZORAL  Apply to affected area twice daily for 1-2 weeks when flaring, can mix with Desonide  cream   levothyroxine  75 MCG tablet Commonly known as: SYNTHROID  Take 1 tablet (75 mcg total) by mouth as directed. 2 tabs on Sundays and 1 tab rest of the week   MAGNESIUM GLUCONATE PO Take 800 mg by mouth 2 (two) times daily. 400 mg each   montelukast  10 MG tablet Commonly known as: SINGULAIR  TAKE 1 TABLET AT BEDTIME.   mupirocin  ointment 2 % Commonly known as: BACTROBAN  Add 1 inch strip to nasal saline rinse and perform rinse with ointment twice daily What changed: Another medication with the same name was removed. Continue taking this medication, and follow the directions you see here. Changed by: Donell PARAS Maziah Keeling   Repatha  SureClick 140 MG/ML Soaj Generic drug: Evolocumab  Inject 140 mg into the skin every 14 (fourteen) days.   telmisartan  80 MG tablet Commonly known as: MICARDIS  Take 1 tablet (80 mg  total) by mouth daily.   timolol 0.5 % ophthalmic solution Commonly known as: TIMOPTIC Place 1 drop into both eyes 2 (two) times daily.   valACYclovir 500 MG tablet Commonly known as: VALTREX Take 500 mg by mouth daily. Takes 1/2 tab.   Vitamin B Complex Tabs as directed Orally   Vitamin D  50 MCG (2000 UT) Caps Take 4,000 Units by mouth daily.          REVIEW OF SYSTEMS: A comprehensive ROS was conducted with the patient and is negative except as per HPI    OBJECTIVE:  VS: BP (!) 142/80   Pulse 62   Ht 5' 3 (1.6 m)   Wt 180 lb (81.6 kg)   SpO2 96%   BMI 31.89 kg/m    Wt Readings from Last 3 Encounters:  04/11/24 180 lb (81.6 kg)  08/09/23 189 lb 9.6 oz (86 kg)  04/19/23 184 lb 9.6 oz (83.7 kg)     EXAM: General: Pt appears well and is in NAD  Neck: General: Supple without adenopathy. Thyroid : Thyroid  size normal.  No goiter or nodules appreciated.   Lungs: Clear with good BS bilat   Heart: Auscultation: RRR.  Abdomen: soft, nontender  Extremities:  BL LE: No pretibial edema   Mental Status: Judgment, insight: Intact Orientation: Oriented to time, place, and person Memory: Intact for recent and remote events Mood and affect: No depression, anxiety, or agitation     DATA REVIEWED:   Latest Reference Range & Units 04/11/24 10:36  Sodium 135 - 146 mmol/L 138  Potassium 3.5 - 5.3 mmol/L 4.7  Chloride 98 - 110 mmol/L 107  CO2 20 - 32 mmol/L 23  Glucose 65 - 99 mg/dL 889 (H)  BUN 7 - 25 mg/dL 23  Creatinine 9.39 - 8.99 mg/dL 9.27  Calcium  8.6 - 10.4 mg/dL 9.7  BUN/Creatinine Ratio 6 - 22 (calc) SEE NOTE:  eGFR > OR = 60 mL/min/1.18m2 86  Vitamin D , 25-Hydroxy 30 - 100 ng/mL 63  PTH, Intact 16 - 77 pg/mL 33  TSH 0.40 - 4.50 mIU/L 1.62  T4,Free(Direct) 0.8 - 1.8 ng/dL 1.6  Albumin  MSPROF 3.6 - 5.1 g/dL 4.4  (H): Data is abnormally high   DXA 04/21/2023 @ [facility]     Results:   Lumbar spine L1-L4(L2,L3) Femoral neck (FN) 33% distal radius   T-score +0.7 RFN: -2.1 LFN: -1.5 -1.2  Change in BMD from previous DXA test (%) Down 2.6% Up 2.5% n/a  (*) statistically significant    FRAX 10-year fracture risk calculator: 21.0 % for any major fracture and 5.5 % for hip fracture.    ASSESSMENT/PLAN/RECOMMENDATIONS:   Low Bone density :  - She met criteria for anti-resorptive therapy with a FRAX of 4.6 % at the hips  , she used to be on Alendronate without reported intolerance, she has been off for years and she opted to remain off bisphosphonate therapy and focus on calcium , vitamin D , and weightbearing exercises - Serum calcium , PTH, GFR and vitamin D  are normal  Medications : Calcium  1200 mg a day Vitamin D  3 2000 IU daily Weightbearing exercise    2. Hypothyroidism:  -Patient is clinically euthyroid - TFTs remain within normal range, no change   Medication  Continue levothyroxine  75 MCG , 2 tabs on Sundays and 1 tabs rest of the week    F/U in 1 yr   Signed electronically by: Stefano Redgie Butts, MD  [facility]  [facility] [address] [place], [place] [address] Phone: [phone] FAX: [phone]   CC: Charlett Apolinar POUR, MD [address] [place] [address] Phone: [phone] Fax: [phone]   Return to Endocrinology clinic as below: Future Appointments  Date Time Provider Department Center  04/11/2024 10:30 AM Houa Nie, Donell Redgie, MD [facility] None

## 2024-04-11 NOTE — Patient Instructions (Signed)
 Please make sure you are consuming around 1200 mg of calcium  daily Please continue weightbearing exercises 3 times a week, 20-25 minutes each session

## 2024-04-13 LAB — BASIC METABOLIC PANEL WITH GFR
BUN: 23 mg/dL (ref 7–25)
CO2: 23 mmol/L (ref 20–32)
Calcium: 9.7 mg/dL (ref 8.6–10.4)
Chloride: 107 mmol/L (ref 98–110)
Creat: 0.72 mg/dL (ref 0.60–1.00)
Glucose, Bld: 110 mg/dL — ABNORMAL HIGH (ref 65–99)
Potassium: 4.7 mmol/L (ref 3.5–5.3)
Sodium: 138 mmol/L (ref 135–146)
eGFR: 86 mL/min/1.73m2 (ref >=60)

## 2024-04-13 LAB — PARATHYROID HORMONE, INTACT (NO CA): PTH: 33 pg/mL (ref 16–77)

## 2024-04-13 LAB — TSH: TSH: 1.62 m[IU]/L (ref 0.40–4.50)

## 2024-04-13 LAB — T4, FREE: Free T4: 1.6 ng/dL (ref 0.8–1.8)

## 2024-04-13 LAB — VITAMIN D 25 HYDROXY (VIT D DEFICIENCY, FRACTURES): Vit D, 25-Hydroxy: 63 ng/mL (ref 30–100)

## 2024-04-13 LAB — ALBUMIN: Albumin: 4.4 g/dL (ref 3.6–5.1)

## 2024-04-16 ENCOUNTER — Other Ambulatory Visit: Payer: Self-pay | Admitting: Internal Medicine

## 2024-04-16 ENCOUNTER — Ambulatory Visit: Payer: Self-pay | Admitting: Internal Medicine

## 2024-04-16 DIAGNOSIS — E785 Hyperlipidemia, unspecified: Secondary | ICD-10-CM

## 2024-04-16 DIAGNOSIS — Z79899 Other long term (current) drug therapy: Secondary | ICD-10-CM

## 2024-04-16 MED ORDER — LEVOTHYROXINE SODIUM 75 MCG PO TABS: 75.0000 ug | ORAL_TABLET | ORAL | 3 refills | Status: AC

## 2024-04-18 ENCOUNTER — Encounter: Payer: Self-pay | Admitting: Internal Medicine

## 2024-04-18 ENCOUNTER — Other Ambulatory Visit (HOSPITAL_BASED_OUTPATIENT_CLINIC_OR_DEPARTMENT_OTHER): Payer: Self-pay

## 2024-04-18 DIAGNOSIS — Z08 Encounter for follow-up examination after completed treatment for malignant neoplasm: Secondary | ICD-10-CM | POA: Diagnosis not present

## 2024-04-18 DIAGNOSIS — D1801 Hemangioma of skin and subcutaneous tissue: Secondary | ICD-10-CM | POA: Diagnosis not present

## 2024-04-18 DIAGNOSIS — L570 Actinic keratosis: Secondary | ICD-10-CM | POA: Diagnosis not present

## 2024-04-18 DIAGNOSIS — Z85828 Personal history of other malignant neoplasm of skin: Secondary | ICD-10-CM | POA: Diagnosis not present

## 2024-04-18 DIAGNOSIS — L304 Erythema intertrigo: Secondary | ICD-10-CM | POA: Diagnosis not present

## 2024-04-18 DIAGNOSIS — L821 Other seborrheic keratosis: Secondary | ICD-10-CM | POA: Diagnosis not present

## 2024-04-18 DIAGNOSIS — L814 Other melanin hyperpigmentation: Secondary | ICD-10-CM | POA: Diagnosis not present

## 2024-04-18 DIAGNOSIS — L578 Other skin changes due to chronic exposure to nonionizing radiation: Secondary | ICD-10-CM | POA: Diagnosis not present

## 2024-04-18 DIAGNOSIS — Z09 Encounter for follow-up examination after completed treatment for conditions other than malignant neoplasm: Secondary | ICD-10-CM | POA: Diagnosis not present

## 2024-04-18 DIAGNOSIS — B001 Herpesviral vesicular dermatitis: Secondary | ICD-10-CM | POA: Diagnosis not present

## 2024-04-18 DIAGNOSIS — Z872 Personal history of diseases of the skin and subcutaneous tissue: Secondary | ICD-10-CM | POA: Diagnosis not present

## 2024-04-18 DIAGNOSIS — L720 Epidermal cyst: Secondary | ICD-10-CM | POA: Diagnosis not present

## 2024-04-18 MED ORDER — FLUOROURACIL 5 % EX CREA
TOPICAL_CREAM | CUTANEOUS | 1 refills | Status: AC
  Filled 2024-04-18: qty 40, 14d supply, fill #0
  Filled 2024-04-26: qty 40, 30d supply, fill #1
  Filled 2024-04-28: qty 40, 14d supply, fill #1

## 2024-04-18 MED ORDER — DESONIDE 0.05 % EX CREA
TOPICAL_CREAM | CUTANEOUS | 3 refills | Status: AC
  Filled 2024-04-18: qty 30, 14d supply, fill #0
  Filled 2024-04-26: qty 60, 30d supply, fill #1
  Filled 2024-04-27: qty 30, 30d supply, fill #1
  Filled 2024-04-28: qty 60, 30d supply, fill #1

## 2024-04-18 MED ORDER — KETOCONAZOLE 2 % EX CREA
TOPICAL_CREAM | CUTANEOUS | 3 refills | Status: AC
  Filled 2024-04-18: qty 60, 30d supply, fill #0

## 2024-04-26 ENCOUNTER — Other Ambulatory Visit (HOSPITAL_BASED_OUTPATIENT_CLINIC_OR_DEPARTMENT_OTHER): Payer: Self-pay

## 2024-04-27 ENCOUNTER — Other Ambulatory Visit (HOSPITAL_BASED_OUTPATIENT_CLINIC_OR_DEPARTMENT_OTHER): Payer: Self-pay

## 2024-04-28 ENCOUNTER — Other Ambulatory Visit (HOSPITAL_BASED_OUTPATIENT_CLINIC_OR_DEPARTMENT_OTHER): Payer: Self-pay

## 2024-05-07 ENCOUNTER — Other Ambulatory Visit (HOSPITAL_BASED_OUTPATIENT_CLINIC_OR_DEPARTMENT_OTHER): Payer: Self-pay

## 2024-05-08 ENCOUNTER — Other Ambulatory Visit (HOSPITAL_BASED_OUTPATIENT_CLINIC_OR_DEPARTMENT_OTHER): Payer: Self-pay

## 2024-05-09 ENCOUNTER — Other Ambulatory Visit (HOSPITAL_BASED_OUTPATIENT_CLINIC_OR_DEPARTMENT_OTHER): Payer: Self-pay

## 2024-05-25 ENCOUNTER — Encounter: Payer: Self-pay | Admitting: Internal Medicine

## 2024-06-06 ENCOUNTER — Encounter: Payer: Self-pay | Admitting: Internal Medicine

## 2025-04-09 ENCOUNTER — Ambulatory Visit: Admitting: Internal Medicine
